# Patient Record
Sex: Male | Born: 1980
Health system: Southern US, Community
[De-identification: ages and names within clinical notes are randomized; demographics above are authoritative.]

## PROBLEM LIST (undated history)

## (undated) DIAGNOSIS — T7840XA Allergy, unspecified, initial encounter: Secondary | ICD-10-CM

## (undated) DIAGNOSIS — N200 Calculus of kidney: Secondary | ICD-10-CM

## (undated) DIAGNOSIS — I10 Essential (primary) hypertension: Secondary | ICD-10-CM

## (undated) DIAGNOSIS — F419 Anxiety disorder, unspecified: Secondary | ICD-10-CM

## (undated) DIAGNOSIS — Z87442 Personal history of urinary calculi: Secondary | ICD-10-CM

## (undated) DIAGNOSIS — K219 Gastro-esophageal reflux disease without esophagitis: Secondary | ICD-10-CM

## (undated) HISTORY — DX: Anxiety disorder, unspecified: F41.9

## (undated) HISTORY — DX: Essential (primary) hypertension: I10

## (undated) HISTORY — DX: Allergy, unspecified, initial encounter: T78.40XA

## (undated) HISTORY — DX: Gastro-esophageal reflux disease without esophagitis: K21.9

---

## 2006-03-20 ENCOUNTER — Emergency Department: Payer: Self-pay

## 2014-07-14 HISTORY — PX: ROOT CANAL: SHX2363

## 2014-11-24 DIAGNOSIS — F418 Other specified anxiety disorders: Secondary | ICD-10-CM

## 2014-11-24 DIAGNOSIS — L709 Acne, unspecified: Secondary | ICD-10-CM | POA: Insufficient documentation

## 2014-11-24 DIAGNOSIS — H7403 Tympanosclerosis, bilateral: Secondary | ICD-10-CM

## 2014-11-24 DIAGNOSIS — J309 Allergic rhinitis, unspecified: Secondary | ICD-10-CM | POA: Insufficient documentation

## 2014-11-24 DIAGNOSIS — K219 Gastro-esophageal reflux disease without esophagitis: Secondary | ICD-10-CM

## 2014-11-24 DIAGNOSIS — F17290 Nicotine dependence, other tobacco product, uncomplicated: Secondary | ICD-10-CM

## 2014-11-24 DIAGNOSIS — F419 Anxiety disorder, unspecified: Secondary | ICD-10-CM | POA: Insufficient documentation

## 2014-11-24 DIAGNOSIS — I159 Secondary hypertension, unspecified: Secondary | ICD-10-CM

## 2014-11-24 DIAGNOSIS — I10 Essential (primary) hypertension: Secondary | ICD-10-CM | POA: Insufficient documentation

## 2014-11-25 ENCOUNTER — Ambulatory Visit (INDEPENDENT_AMBULATORY_CARE_PROVIDER_SITE_OTHER): Payer: 59 | Admitting: Unknown Physician Specialty

## 2014-11-25 ENCOUNTER — Encounter: Payer: Self-pay | Admitting: Unknown Physician Specialty

## 2014-11-25 VITALS — BP 122/73 | HR 64 | Temp 97.6°F | Ht 65.1 in | Wt 142.2 lb

## 2014-11-25 DIAGNOSIS — K219 Gastro-esophageal reflux disease without esophagitis: Secondary | ICD-10-CM | POA: Diagnosis not present

## 2014-11-25 DIAGNOSIS — J309 Allergic rhinitis, unspecified: Secondary | ICD-10-CM | POA: Diagnosis not present

## 2014-11-25 MED ORDER — OMEPRAZOLE 20 MG PO CPDR: 20.0000 mg | DELAYED_RELEASE_CAPSULE | Freq: Every day | ORAL | Status: DC

## 2014-11-25 NOTE — Patient Instructions (Signed)
Food Choices for Gastroesophageal Reflux Disease When you have gastroesophageal reflux disease (GERD), the foods you eat and your eating habits are very important. Choosing the right foods can help ease the discomfort of GERD. WHAT GENERAL GUIDELINES DO I NEED TO FOLLOW?  Choose fruits, vegetables, whole grains, low-fat dairy products, and low-fat meat, fish, and poultry.  Limit fats such as oils, salad dressings, butter, nuts, and avocado.  Keep a food diary to identify foods that cause symptoms.  Avoid foods that cause reflux. These may be different for different people.  Eat frequent small meals instead of three large meals each day.  Eat your meals slowly, in a relaxed setting.  Limit fried foods.  Cook foods using methods other than frying.  Avoid drinking alcohol.  Avoid drinking large amounts of liquids with your meals.  Avoid bending over or lying down until 2-3 hours after eating. WHAT FOODS ARE NOT RECOMMENDED? The following are some foods and drinks that may worsen your symptoms: Vegetables Tomatoes. Tomato juice. Tomato and spaghetti sauce. Chili peppers. Onion and garlic. Horseradish. Fruits Oranges, grapefruit, and lemon (fruit and juice). Meats High-fat meats, fish, and poultry. This includes hot dogs, ribs, ham, sausage, salami, and bacon. Dairy Whole milk and chocolate milk. Sour cream. Cream. Butter. Ice cream. Cream cheese.  Beverages Coffee and tea, with or without caffeine. Carbonated beverages or energy drinks. Condiments Hot sauce. Barbecue sauce.  Sweets/Desserts Chocolate and cocoa. Donuts. Peppermint and spearmint. Fats and Oils High-fat foods, including French fries and potato chips. Other Vinegar. Strong spices, such as black pepper, white pepper, red pepper, cayenne, curry powder, cloves, ginger, and chili powder. The items listed above may not be a complete list of foods and beverages to avoid. Contact your dietitian for more  information. Document Released: 05/30/2005 Document Revised: 06/04/2013 Document Reviewed: 04/03/2013 ExitCare Patient Information 2015 [employer]. This information is not intended to replace advice given to you by your health care provider. Make sure you discuss any questions you have with your health care provider. Gastroesophageal Reflux Disease, Adult Gastroesophageal reflux disease (GERD) happens when acid from your stomach flows up into the esophagus. When acid comes in contact with the esophagus, the acid causes soreness (inflammation) in the esophagus. Over time, GERD may create small holes (ulcers) in the lining of the esophagus. CAUSES   Increased body weight. This puts pressure on the stomach, making acid rise from the stomach into the esophagus.  Smoking. This increases acid production in the stomach.  Drinking alcohol. This causes decreased pressure in the lower esophageal sphincter (valve or ring of muscle between the esophagus and stomach), allowing acid from the stomach into the esophagus.  Late evening meals and a full stomach. This increases pressure and acid production in the stomach.  A malformed lower esophageal sphincter. Sometimes, no cause is found. SYMPTOMS   Burning pain in the lower part of the mid-chest behind the breastbone and in the mid-stomach area. This may occur twice a week or more often.  Trouble swallowing.  Sore throat.  Dry cough.  Asthma-like symptoms including chest tightness, shortness of breath, or wheezing. DIAGNOSIS  Your caregiver may be able to diagnose GERD based on your symptoms. In some cases, X-rays and other tests may be done to check for complications or to check the condition of your stomach and esophagus. TREATMENT  Your caregiver may recommend over-the-counter or prescription medicines to help decrease acid production. Ask your caregiver before starting or adding any new medicines.  HOME CARE   INSTRUCTIONS   Change the  factors that you can control. Ask your caregiver for guidance concerning weight loss, quitting smoking, and alcohol consumption.  Avoid foods and drinks that make your symptoms worse, such as:  Caffeine or alcoholic drinks.  Chocolate.  Peppermint or mint flavorings.  Garlic and onions.  Spicy foods.  Citrus fruits, such as oranges, lemons, or limes.  Tomato-based foods such as sauce, chili, salsa, and pizza.  Fried and fatty foods.  Avoid lying down for the 3 hours prior to your bedtime or prior to taking a nap.  Eat small, frequent meals instead of large meals.  Wear loose-fitting clothing. Do not wear anything tight around your waist that causes pressure on your stomach.  Raise the head of your bed 6 to 8 inches with wood blocks to help you sleep. Extra pillows will not help.  Only take over-the-counter or prescription medicines for pain, discomfort, or fever as directed by your caregiver.  Do not take aspirin, ibuprofen, or other nonsteroidal anti-inflammatory drugs (NSAIDs). SEEK IMMEDIATE MEDICAL CARE IF:   You have pain in your arms, neck, jaw, teeth, or back.  Your pain increases or changes in intensity or duration.  You develop nausea, vomiting, or sweating (diaphoresis).  You develop shortness of breath, or you faint.  Your vomit is green, yellow, black, or looks like coffee grounds or blood.  Your stool is red, bloody, or black. These symptoms could be signs of other problems, such as heart disease, gastric bleeding, or esophageal bleeding. MAKE SURE YOU:   Understand these instructions.  Will watch your condition.  Will get help right away if you are not doing well or get worse. Document Released: 03/09/2005 Document Revised: 08/22/2011 Document Reviewed: 12/17/2010 ExitCare Patient Information 2015 [employer]. This information is not intended to replace advice given to you by your health care provider. Make sure you discuss any questions you have  with your health care provider.  

## 2014-11-25 NOTE — Assessment & Plan Note (Signed)
Well controlled without medication.  He will use OTC meds prn

## 2014-11-25 NOTE — Assessment & Plan Note (Addendum)
Rx for Omeprazole.  Pt ed on trigger avoidance.  Decrease from 40 mg to 20 mg

## 2014-11-25 NOTE — Progress Notes (Signed)
   BP 122/73 mmHg  Pulse 64  Temp(Src) 97.6 F (36.4 C)  Ht 5' 5.1" (1.654 m)  Wt 142 lb 3.2 oz (64.501 kg)  BMI 23.58 kg/m2  SpO2 99%   Subjective:    Patient ID: Rodney Myers, male    DOB: 03/14/1981, 34 y.o.   MRN: [id number]  HPI: Rodney Myers is a 34 y.o. male  Chief Complaint  Patient presents with  . Allergic Rhinitis   . Gastrophageal Reflux    pt states he needs a med refill for this   Gastrophageal Reflux He complains of abdominal pain, belching and heartburn. Really wants a refill of Omeprazole.  OTC Zantac not working. This is a chronic problem. The problem occurs constantly. The problem has been unchanged. The symptoms are aggravated by certain foods and smoking. Pertinent negatives include no anemia, fatigue, melena, muscle weakness, orthopnea or weight loss. The treatment provided significant relief.   Allergies:  Well controlled with taking local honey.  Barely uses Flonase or Allegra.    Relevant past medical, surgical, family and social history reviewed and updated as indicated. Interim medical history since our last visit reviewed. Allergies and medications reviewed and updated.  Review of Systems  Constitutional: Negative for weight loss and fatigue.  Gastrointestinal: Positive for heartburn and abdominal pain. Negative for melena.  Musculoskeletal: Negative for muscle weakness.    Per HPI unless specifically indicated above     Objective:    BP 122/73 mmHg  Pulse 64  Temp(Src) 97.6 F (36.4 C)  Ht 5' 5.1" (1.654 m)  Wt 142 lb 3.2 oz (64.501 kg)  BMI 23.58 kg/m2  SpO2 99%  Wt Readings from Last 3 Encounters:  11/25/14 142 lb 3.2 oz (64.501 kg)  02/18/14 135 lb (61.236 kg)    Physical Exam  Constitutional: He is oriented to person, place, and time. He appears well-developed and well-nourished. No distress.  HENT:  Head: Normocephalic and atraumatic.  Eyes: Conjunctivae and lids are normal. Right eye exhibits no discharge. Left eye  exhibits no discharge. No scleral icterus.  Cardiovascular: Normal rate and regular rhythm.   Pulmonary/Chest: Effort normal. No respiratory distress.  Abdominal: Normal appearance and bowel sounds are normal. He exhibits no distension. There is no splenomegaly or hepatomegaly. There is no tenderness.  Musculoskeletal: Normal range of motion.  Neurological: He is alert and oriented to person, place, and time.  Skin: Skin is intact. No rash noted. No pallor.  Psychiatric: He has a normal mood and affect. His behavior is normal. Judgment and thought content normal.    No results found for this or any previous visit.    Assessment & Plan:   Problem List Items Addressed This Visit      Respiratory   Allergic rhinitis - Primary    Well controlled without medication.  He will use OTC meds prn        Digestive   GERD (gastroesophageal reflux disease)    Rx for Omeprazole.  Pt ed on trigger avoidance.  Decrease from 40 mg to 20 mg      Relevant Medications   omeprazole (PRILOSEC) 20 MG capsule       Follow up plan: No Follow-up on file.

## 2015-03-06 ENCOUNTER — Encounter: Payer: Self-pay | Admitting: Unknown Physician Specialty

## 2015-03-06 ENCOUNTER — Ambulatory Visit (INDEPENDENT_AMBULATORY_CARE_PROVIDER_SITE_OTHER): Payer: 59 | Admitting: Unknown Physician Specialty

## 2015-03-06 VITALS — BP 125/91 | HR 85 | Temp 98.4°F | Ht 65.2 in | Wt 145.6 lb

## 2015-03-06 DIAGNOSIS — F17290 Nicotine dependence, other tobacco product, uncomplicated: Secondary | ICD-10-CM | POA: Diagnosis not present

## 2015-03-06 DIAGNOSIS — Z0000 Encounter for general adult medical examination without abnormal findings: Secondary | ICD-10-CM

## 2015-03-06 NOTE — Assessment & Plan Note (Signed)
Discussed smoking cessation.  Pt ed given.

## 2015-03-06 NOTE — Patient Instructions (Signed)
Smoking Cessation Quitting smoking is important to your health and has many advantages. However, it is not always easy to quit since nicotine is a very addictive drug. Oftentimes, people try 3 times or more before being able to quit. This document explains the best ways for you to prepare to quit smoking. Quitting takes hard work and a lot of effort, but you can do it. ADVANTAGES OF QUITTING SMOKING  You will live longer, feel better, and live better.  Your body will feel the impact of quitting smoking almost immediately.  Within 20 minutes, blood pressure decreases. Your pulse returns to its normal level.  After 8 hours, carbon monoxide levels in the blood return to normal. Your oxygen level increases.  After 24 hours, the chance of having a heart attack starts to decrease. Your breath, hair, and body stop smelling like smoke.  After 48 hours, damaged nerve endings begin to recover. Your sense of taste and smell improve.  After 72 hours, the body is virtually free of nicotine. Your bronchial tubes relax and breathing becomes easier.  After 2 to 12 weeks, lungs can hold more air. Exercise becomes easier and circulation improves.  The risk of having a heart attack, stroke, cancer, or lung disease is greatly reduced.  After 1 year, the risk of coronary heart disease is cut in half.  After 5 years, the risk of stroke falls to the same as a nonsmoker.  After 10 years, the risk of lung cancer is cut in half and the risk of other cancers decreases significantly.  After 15 years, the risk of coronary heart disease drops, usually to the level of a nonsmoker.  If you are pregnant, quitting smoking will improve your chances of having a healthy baby.  The people you live with, especially any children, will be healthier.  You will have extra money to spend on things other than cigarettes. QUESTIONS TO THINK ABOUT BEFORE ATTEMPTING TO QUIT You may want to talk about your answers with your  health care provider.  Why do you want to quit?  If you tried to quit in the past, what helped and what did not?  What will be the most difficult situations for you after you quit? How will you plan to handle them?  Who can help you through the tough times? Your family? Friends? A health care provider?  What pleasures do you get from smoking? What ways can you still get pleasure if you quit? Here are some questions to ask your health care provider:  How can you help me to be successful at quitting?  What medicine do you think would be best for me and how should I take it?  What should I do if I need more help?  What is smoking withdrawal like? How can I get information on withdrawal? GET READY  Set a quit date.  Change your environment by getting rid of all cigarettes, ashtrays, matches, and lighters in your home, car, or work. Do not let people smoke in your home.  Review your past attempts to quit. Think about what worked and what did not. GET SUPPORT AND ENCOURAGEMENT You have a better chance of being successful if you have help. You can get support in many ways.  Tell your family, friends, and coworkers that you are going to quit and need their support. Ask them not to smoke around you.  Get individual, group, or telephone counseling and support. Programs are available at local hospitals and health centers. Call   your local health department for information about programs in your area.  Spiritual beliefs and practices may help some smokers quit.  Download a "quit meter" on your computer to keep track of quit statistics, such as how long you have gone without smoking, cigarettes not smoked, and money saved.  Get a self-help book about quitting smoking and staying off tobacco. LEARN NEW SKILLS AND BEHAVIORS  Distract yourself from urges to smoke. Talk to someone, go for a walk, or occupy your time with a task.  Change your normal routine. Take a different route to work.  Drink tea instead of coffee. Eat breakfast in a different place.  Reduce your stress. Take a hot bath, exercise, or read a book.  Plan something enjoyable to do every day. Reward yourself for not smoking.  Explore interactive web-based programs that specialize in helping you quit. GET MEDICINE AND USE IT CORRECTLY Medicines can help you stop smoking and decrease the urge to smoke. Combining medicine with the above behavioral methods and support can greatly increase your chances of successfully quitting smoking.  Nicotine replacement therapy helps deliver nicotine to your body without the negative effects and risks of smoking. Nicotine replacement therapy includes nicotine gum, lozenges, inhalers, nasal sprays, and skin patches. Some may be available over-the-counter and others require a prescription.  Antidepressant medicine helps people abstain from smoking, but how this works is unknown. This medicine is available by prescription.  Nicotinic receptor partial agonist medicine simulates the effect of nicotine in your brain. This medicine is available by prescription. Ask your health care provider for advice about which medicines to use and how to use them based on your health history. Your health care provider will tell you what side effects to look out for if you choose to be on a medicine or therapy. Carefully read the information on the package. Do not use any other product containing nicotine while using a nicotine replacement product.  RELAPSE OR DIFFICULT SITUATIONS Most relapses occur within the first 3 months after quitting. Do not be discouraged if you start smoking again. Remember, most people try several times before finally quitting. You may have symptoms of withdrawal because your body is used to nicotine. You may crave cigarettes, be irritable, feel very hungry, cough often, get headaches, or have difficulty concentrating. The withdrawal symptoms are only temporary. They are strongest  when you first quit, but they will go away within 10-14 days. To reduce the chances of relapse, try to:  Avoid drinking alcohol. Drinking lowers your chances of successfully quitting.  Reduce the amount of caffeine you consume. Once you quit smoking, the amount of caffeine in your body increases and can give you symptoms, such as a rapid heartbeat, sweating, and anxiety.  Avoid smokers because they can make you want to smoke.  Do not let weight gain distract you. Many smokers will gain weight when they quit, usually less than 10 pounds. Eat a healthy diet and stay active. You can always lose the weight gained after you quit.  Find ways to improve your mood other than smoking. FOR MORE INFORMATION  www.smokefree.gov  Document Released: 05/24/2001 Document Revised: 10/14/2013 Document Reviewed: 09/08/2011 ExitCare Patient Information 2015 [employer]. This information is not intended to replace advice given to you by your health care provider. Make sure you discuss any questions you have with your health care provider.  

## 2015-03-06 NOTE — Progress Notes (Signed)
   BP 125/91 mmHg  Pulse 85  Temp(Src) 98.4 F (36.9 C)  Ht 5' 5.2" (1.656 m)  Wt 145 lb 9.6 oz (66.044 kg)  BMI 24.08 kg/m2  SpO2 99%   Subjective:    Patient ID: Rodney Myers, male    DOB: 06-05-1981, 34 y.o.   MRN: [id number]  HPI: Rodney Myers is a 34 y.o. male  Chief Complaint  Patient presents with  . Annual Exam    pt states he is getting his flu shot at [facility]    Relevant past medical, surgical, family and social history reviewed and updated as indicated. Interim medical history since our last visit reviewed. Allergies and medications reviewed and updated.  Smoking cessation: Has tried Chantix which mae him depressed.  He is trying to cut back.    Review of Systems  Constitutional: Negative.   HENT: Negative.   Eyes: Negative.   Respiratory: Negative.   Cardiovascular: Negative.   Gastrointestinal: Negative.   Endocrine: Negative.   Genitourinary: Negative.   Skin: Negative.   Allergic/Immunologic: Negative.   Neurological: Negative.   Hematological: Negative.   Psychiatric/Behavioral: Negative.     Per HPI unless specifically indicated above     Objective:    BP 125/91 mmHg  Pulse 85  Temp(Src) 98.4 F (36.9 C)  Ht 5' 5.2" (1.656 m)  Wt 145 lb 9.6 oz (66.044 kg)  BMI 24.08 kg/m2  SpO2 99%  Wt Readings from Last 3 Encounters:  03/06/15 145 lb 9.6 oz (66.044 kg)  11/25/14 142 lb 3.2 oz (64.501 kg)  02/18/14 135 lb (61.236 kg)    Physical Exam  Constitutional: He is oriented to person, place, and time. He appears well-developed and well-nourished.  HENT:  Head: Normocephalic.  Right Ear: External ear normal.  Left Ear: External ear normal.  Eyes: Pupils are equal, round, and reactive to light.  Cardiovascular: Normal rate, regular rhythm and normal heart sounds.   Pulmonary/Chest: Effort normal and breath sounds normal. No respiratory distress.  Abdominal: Soft. Bowel sounds are normal.  Musculoskeletal: Normal range of  motion.  Neurological: He is alert and oriented to person, place, and time. He has normal reflexes.  Skin: Skin is warm and dry.  Psychiatric: He has a normal mood and affect. His behavior is normal. Judgment and thought content normal.    No results found for this or any previous visit.    Assessment & Plan:   Problem List Items Addressed This Visit      Unprioritized   Other tobacco product nicotine dependence, uncomplicated    Discussed smoking cessation.  Pt ed given.         Other Visit Diagnoses    Annual physical exam    -  Primary    Relevant Orders    CBC with Differential/Platelet    Comprehensive metabolic panel    HIV antibody    Lipid Panel w/o Chol/HDL Ratio    TSH        Follow up plan: Return in about 1 year (around 03/05/2016), or if symptoms worsen or fail to improve.

## 2015-03-07 LAB — LIPID PANEL W/O CHOL/HDL RATIO
Cholesterol, Total: 151 mg/dL (ref 100–199)
HDL: 43 mg/dL (ref >39)
LDL Calculated: 99 mg/dL (ref 0–99)
Triglycerides: 47 mg/dL (ref 0–149)
VLDL Cholesterol Cal: 9 mg/dL (ref 5–40)

## 2015-03-07 LAB — CBC WITH DIFFERENTIAL/PLATELET
Basophils Absolute: 0.1 10*3/uL (ref 0.0–0.2)
Basos: 1 %
EOS (ABSOLUTE): 0.7 10*3/uL — ABNORMAL HIGH (ref 0.0–0.4)
Eos: 12 %
Hematocrit: 43.2 % (ref 37.5–51.0)
Hemoglobin: 14.7 g/dL (ref 12.6–17.7)
Immature Grans (Abs): 0 10*3/uL (ref 0.0–0.1)
Immature Granulocytes: 0 %
Lymphocytes Absolute: 1.9 10*3/uL (ref 0.7–3.1)
Lymphs: 32 %
MCH: 29 pg (ref 26.6–33.0)
MCHC: 34 g/dL (ref 31.5–35.7)
MCV: 85 fL (ref 79–97)
Monocytes Absolute: 0.4 10*3/uL (ref 0.1–0.9)
Monocytes: 7 %
Neutrophils Absolute: 2.8 10*3/uL (ref 1.4–7.0)
Neutrophils: 48 %
Platelets: 189 10*3/uL (ref 150–379)
RBC: 5.07 x10E6/uL (ref 4.14–5.80)
RDW: 13.2 % (ref 12.3–15.4)
WBC: 5.9 10*3/uL (ref 3.4–10.8)

## 2015-03-07 LAB — COMPREHENSIVE METABOLIC PANEL
ALT: 12 IU/L (ref 0–44)
AST: 15 IU/L (ref 0–40)
Albumin/Globulin Ratio: 2.1 (ref 1.1–2.5)
Albumin: 4.7 g/dL (ref 3.5–5.5)
Alkaline Phosphatase: 78 IU/L (ref 39–117)
BUN/Creatinine Ratio: 12 (ref 8–19)
BUN: 12 mg/dL (ref 6–20)
Bilirubin Total: 0.8 mg/dL (ref 0.0–1.2)
CO2: 23 mmol/L (ref 18–29)
Calcium: 9.5 mg/dL (ref 8.7–10.2)
Chloride: 102 mmol/L (ref 97–108)
Creatinine, Ser: 0.99 mg/dL (ref 0.76–1.27)
GFR calc Af Amer: 115 mL/min/{1.73_m2} (ref >59)
GFR calc non Af Amer: 100 mL/min/{1.73_m2} (ref >59)
Globulin, Total: 2.2 g/dL (ref 1.5–4.5)
Glucose: 97 mg/dL (ref 65–99)
Potassium: 4 mmol/L (ref 3.5–5.2)
Sodium: 143 mmol/L (ref 134–144)
Total Protein: 6.9 g/dL (ref 6.0–8.5)

## 2015-03-07 LAB — HIV ANTIBODY (ROUTINE TESTING W REFLEX): HIV Screen 4th Generation wRfx: NONREACTIVE

## 2015-03-07 LAB — TSH: TSH: 1.9 u[IU]/mL (ref 0.450–4.500)

## 2015-03-09 ENCOUNTER — Encounter: Payer: Self-pay | Admitting: Unknown Physician Specialty

## 2015-11-11 DIAGNOSIS — H5201 Hypermetropia, right eye: Secondary | ICD-10-CM | POA: Diagnosis not present

## 2016-03-17 DIAGNOSIS — Z23 Encounter for immunization: Secondary | ICD-10-CM | POA: Diagnosis not present

## 2016-08-02 ENCOUNTER — Encounter: Payer: Self-pay | Admitting: Family Medicine

## 2016-08-02 ENCOUNTER — Encounter: Payer: Self-pay | Admitting: Unknown Physician Specialty

## 2016-08-02 ENCOUNTER — Ambulatory Visit (INDEPENDENT_AMBULATORY_CARE_PROVIDER_SITE_OTHER): Payer: BLUE CROSS/BLUE SHIELD | Admitting: Family Medicine

## 2016-08-02 VITALS — BP 122/81 | HR 77 | Temp 98.4°F | Wt 129.0 lb

## 2016-08-02 DIAGNOSIS — J029 Acute pharyngitis, unspecified: Secondary | ICD-10-CM | POA: Diagnosis not present

## 2016-08-02 DIAGNOSIS — J101 Influenza due to other identified influenza virus with other respiratory manifestations: Secondary | ICD-10-CM | POA: Diagnosis not present

## 2016-08-02 DIAGNOSIS — R6889 Other general symptoms and signs: Secondary | ICD-10-CM | POA: Diagnosis not present

## 2016-08-02 LAB — VERITOR FLU A/B WAIVED
Influenza A: POSITIVE — AB
Influenza B: NEGATIVE

## 2016-08-02 MED ORDER — METRONIDAZOLE 500 MG PO TABS: 500.0000 mg | ORAL_TABLET | Freq: Two times a day (BID) | ORAL | 0 refills | Status: DC

## 2016-08-02 MED ORDER — FLUCONAZOLE 150 MG PO TABS: 150.0000 mg | ORAL_TABLET | Freq: Once | ORAL | 0 refills | Status: DC

## 2016-08-02 MED ORDER — OSELTAMIVIR PHOSPHATE 75 MG PO CAPS: 75.0000 mg | ORAL_CAPSULE | Freq: Two times a day (BID) | ORAL | 0 refills | Status: DC

## 2016-08-02 NOTE — Progress Notes (Signed)
BP 122/81   Pulse 77   Temp 98.4 F (36.9 C)   Wt 129 lb (58.5 kg)   SpO2 99%   BMI 21.34 kg/m    Subjective:    Patient ID: Rodney Myers, male    DOB: 20-Nov-1980, 36 y.o.   MRN: [id number]  HPI: Rodney Myers is a 36 y.o. male  Chief Complaint  Patient presents with  . Sore Throat    x2 days, sore throat, slight cough, fatigue,some soreness,mild head congestion, slight h/a   Patient with 2 days of sore throat, cough, fatigue, chills, aches, congestion, and HA. Denies known fever, ear pain, N/V/D, CP, SOB. Taking tylenol and nyquil with some relief. Many sick contacts recently.   Past Medical History:  Diagnosis Date  . Allergy   . Anxiety   . GERD (gastroesophageal reflux disease)   . Hypertension    Social History   Social History  . Marital status: Single    Spouse name: N/A  . Number of children: N/A  . Years of education: N/A   Occupational History  . Not on file.   Social History Main Topics  . Smoking status: Current Every Day Smoker    Packs/day: 1.00    Types: Cigarettes  . Smokeless tobacco: Former Neurosurgeon  . Alcohol use 0.0 oz/week     Comment: pt states very rare  . Drug use: No  . Sexual activity: No   Other Topics Concern  . Not on file   Social History Narrative  . No narrative on file    Relevant past medical, surgical, family and social history reviewed and updated as indicated. Interim medical history since our last visit reviewed. Allergies and medications reviewed and updated.  Review of Systems  Constitutional: Positive for chills and fatigue.  HENT: Positive for congestion and sore throat.   Eyes: Negative.   Respiratory: Positive for cough.   Cardiovascular: Negative.   Gastrointestinal: Negative.   Genitourinary: Negative.   Musculoskeletal: Positive for myalgias.  Neurological: Positive for headaches.  Psychiatric/Behavioral: Negative.     Per HPI unless specifically indicated above     Objective:    BP  122/81   Pulse 77   Temp 98.4 F (36.9 C)   Wt 129 lb (58.5 kg)   SpO2 99%   BMI 21.34 kg/m   Wt Readings from Last 3 Encounters:  08/02/16 129 lb (58.5 kg)  03/06/15 145 lb 9.6 oz (66 kg)  11/25/14 142 lb 3.2 oz (64.5 kg)    Physical Exam  Constitutional: He is oriented to person, place, and time. He appears well-developed and well-nourished. No distress.  HENT:  Head: Atraumatic.  Right Ear: External ear normal.  Left Ear: External ear normal.  Nose: Nose normal.  Mouth/Throat: No oropharyngeal exudate.  Oropharynx erythematous   Eyes: Conjunctivae are normal. Pupils are equal, round, and reactive to light. No scleral icterus.  Neck: Normal range of motion. Neck supple.  Cardiovascular: Normal rate and normal heart sounds.   Pulmonary/Chest: Effort normal and breath sounds normal. No respiratory distress.  Musculoskeletal: Normal range of motion.  Lymphadenopathy:    He has no cervical adenopathy.  Neurological: He is alert and oriented to person, place, and time.  Skin: Skin is warm and dry.  Psychiatric: He has a normal mood and affect. His behavior is normal.  Nursing note and vitals reviewed.   Results for orders placed or performed in visit on 08/02/16  Rapid strep screen (not at [facility])  Result Value Ref Range   Strep Gp A Ag, IA W/Reflex Negative Negative  Influenza A & B (STAT)  Result Value Ref Range   Influenza A Positive (A) Negative   Influenza B Negative Negative  Culture, Group A Strep  Result Value Ref Range   Strep A Culture WILL FOLLOW       Assessment & Plan:   Problem List Items Addressed This Visit    None    Visit Diagnoses    Influenza A    -  Primary   Tamiflu sent, discussed supportive care and continuing tylenol, ibuprofen, nyquil prn. Follow up if worsening or no improvement   Relevant Medications   oseltamivir (TAMIFLU) 75 MG capsule   Other Relevant Orders   Influenza A & B (STAT) (Completed)   Sore throat       Rapid strep  negative, await cx. Lozenges, throat sprays, ibuprofen for sympomatic relief.    Relevant Orders   [facility]apid [facility] screen [facility]not at [facility]) (Completed)       Follow up plan: Return if symptoms worsen or fail to improve.

## 2016-08-02 NOTE — Patient Instructions (Addendum)
Follow up as needed

## 2016-08-05 LAB — CULTURE, GROUP A STREP: Strep A Culture: NEGATIVE

## 2016-08-05 LAB — RAPID STREP SCREEN (MED CTR MEBANE ONLY): Strep Gp A Ag, IA W/Reflex: NEGATIVE

## 2016-12-27 ENCOUNTER — Ambulatory Visit (INDEPENDENT_AMBULATORY_CARE_PROVIDER_SITE_OTHER): Payer: BLUE CROSS/BLUE SHIELD | Admitting: Unknown Physician Specialty

## 2016-12-27 ENCOUNTER — Encounter: Payer: Self-pay | Admitting: Unknown Physician Specialty

## 2016-12-27 VITALS — BP 120/80 | HR 93 | Wt 139.0 lb

## 2016-12-27 DIAGNOSIS — R21 Rash and other nonspecific skin eruption: Secondary | ICD-10-CM | POA: Diagnosis not present

## 2016-12-27 DIAGNOSIS — B880 Other acariasis: Secondary | ICD-10-CM

## 2016-12-27 MED ORDER — PREDNISONE 10 MG PO TABS: ORAL_TABLET | ORAL | 0 refills | Status: DC

## 2016-12-27 MED ORDER — BETAMETHASONE DIPROPIONATE AUG 0.05 % EX CREA: TOPICAL_CREAM | Freq: Two times a day (BID) | CUTANEOUS | 0 refills | Status: DC

## 2016-12-27 MED ORDER — DOXYCYCLINE HYCLATE 100 MG PO TABS: 100.0000 mg | ORAL_TABLET | Freq: Two times a day (BID) | ORAL | 0 refills | Status: DC

## 2016-12-27 NOTE — Progress Notes (Addendum)
   BP 120/80 (BP Location: Left Arm, Patient Position: Sitting, Cuff Size: Normal)   Pulse 93   Wt 139 lb (63 kg)   SpO2 98%   BMI 22.99 kg/m    Subjective:    Patient ID: Rodney Myers, male    DOB: 1981/02/23, 36 y.o.   MRN: [id number]  HPI: Rodney Massedicholas J Ayotte is a 36 y.o. male  Chief Complaint  Patient presents with  . Other    bites, foot is covered, blisters popped.    Pt with rash of bilateral legs.  He developed blisters worse on right foot then left.  He then popped the blisters with a sterile knife and wrapped with gauze.  States it itches but not as painful as it was.  States it's a little better than yesterday but not spreading.    Relevant past medical, surgical, family and social history reviewed and updated as indicated. Interim medical history since our last visit reviewed. Allergies and medications reviewed and updated.  Review of Systems  Per HPI unless specifically indicated above     Objective:    BP 120/80 (BP Location: Left Arm, Patient Position: Sitting, Cuff Size: Normal)   Pulse 93   Wt 139 lb (63 kg)   SpO2 98%   BMI 22.99 kg/m   Wt Readings from Last 3 Encounters:  12/27/16 139 lb (63 kg)  08/02/16 129 lb (58.5 kg)  03/06/15 145 lb 9.6 oz (66 kg)    Physical Exam  Constitutional: He is oriented to person, place, and time. He appears well-developed and well-nourished. No distress.  HENT:  Head: Normocephalic and atraumatic.  Eyes: Conjunctivae and lids are normal. Right eye exhibits no discharge. Left eye exhibits no discharge. No scleral icterus.  Cardiovascular: Normal rate.   Pulmonary/Chest: Effort normal.  Abdominal: Normal appearance. There is no splenomegaly or hepatomegaly.  Musculoskeletal: Normal range of motion.  Neurological: He is alert and oriented to person, place, and time.  Skin: Skin is intact. Rash noted. No pallor.  Bilateral legs some blistering but mostly macular rash.    Psychiatric: He has a normal mood and affect.  His behavior is normal. Judgment and thought content normal.    Results for orders placed or performed in visit on 08/02/16  Rapid strep screen (not at [facility])  Result Value Ref Range   Strep Gp A Ag, IA W/Reflex Negative Negative  Influenza A & B (STAT)  Result Value Ref Range   Influenza A Positive (A) Negative   Influenza B Negative Negative  Culture, Group A Strep  Result Value Ref Range   Strep A Culture Negative       Assessment & Plan:   Problem List Items Addressed This Visit    None    Visit Diagnoses    Rash and nonspecific skin eruption    -  Primary   Chigger bites       Treat secondary infection with Doxycycline.  Steroid cream.  Rx with prednisone dose pack       Follow up plan: Return if symptoms worsen or fail to improve.

## 2017-03-22 DIAGNOSIS — Z23 Encounter for immunization: Secondary | ICD-10-CM | POA: Diagnosis not present

## 2017-04-16 ENCOUNTER — Emergency Department: Payer: BLUE CROSS/BLUE SHIELD

## 2017-04-16 ENCOUNTER — Emergency Department
Admission: EM | Admit: 2017-04-16 | Discharge: 2017-04-16 | Disposition: A | Discharge status: Home / Self Care | Payer: BLUE CROSS/BLUE SHIELD | Attending: Student in an Organized Health Care Education/Training Program | Admitting: Student in an Organized Health Care Education/Training Program

## 2017-04-16 ENCOUNTER — Other Ambulatory Visit: Payer: Self-pay

## 2017-04-16 DIAGNOSIS — I10 Essential (primary) hypertension: Secondary | ICD-10-CM | POA: Diagnosis not present

## 2017-04-16 DIAGNOSIS — R109 Unspecified abdominal pain: Secondary | ICD-10-CM | POA: Insufficient documentation

## 2017-04-16 DIAGNOSIS — F17210 Nicotine dependence, cigarettes, uncomplicated: Secondary | ICD-10-CM | POA: Insufficient documentation

## 2017-04-16 DIAGNOSIS — R110 Nausea: Secondary | ICD-10-CM | POA: Insufficient documentation

## 2017-04-16 DIAGNOSIS — R319 Hematuria, unspecified: Secondary | ICD-10-CM | POA: Diagnosis not present

## 2017-04-16 LAB — URINALYSIS, COMPLETE (UACMP) WITH MICROSCOPIC
Bacteria, UA: NONE SEEN
Bilirubin Urine: NEGATIVE
Glucose, UA: NEGATIVE mg/dL
Ketones, ur: NEGATIVE mg/dL
Leukocytes, UA: NEGATIVE
Nitrite: NEGATIVE
Protein, ur: 30 mg/dL — AB
Specific Gravity, Urine: 1.02 (ref 1.005–1.030)
Squamous Epithelial / LPF: NONE SEEN
WBC, UA: NONE SEEN WBC/hpf (ref 0–5)
pH: 7 (ref 5.0–8.0)

## 2017-04-16 LAB — CBC
HCT: 45.8 % (ref 40.0–52.0)
Hemoglobin: 15.6 g/dL (ref 13.0–18.0)
MCH: 29.6 pg (ref 26.0–34.0)
MCHC: 34 g/dL (ref 32.0–36.0)
MCV: 87 fL (ref 80.0–100.0)
Platelets: 233 10*3/uL (ref 150–440)
RBC: 5.27 MIL/uL (ref 4.40–5.90)
RDW: 13 % (ref 11.5–14.5)
WBC: 7.4 10*3/uL (ref 3.8–10.6)

## 2017-04-16 LAB — BASIC METABOLIC PANEL
Anion gap: 7 (ref 5–15)
BUN: 19 mg/dL (ref 6–20)
CO2: 26 mmol/L (ref 22–32)
Calcium: 9.3 mg/dL (ref 8.9–10.3)
Chloride: 106 mmol/L (ref 101–111)
Creatinine, Ser: 1.08 mg/dL (ref 0.61–1.24)
GFR calc Af Amer: >60 mL/min (ref >60)
GFR calc non Af Amer: >60 mL/min (ref >60)
Glucose, Bld: 130 mg/dL — ABNORMAL HIGH (ref 65–99)
Potassium: 3.9 mmol/L (ref 3.5–5.1)
Sodium: 139 mmol/L (ref 135–145)

## 2017-04-16 MED ORDER — MORPHINE SULFATE (PF) 4 MG/ML IV SOLN
4.0000 mg | Freq: Once | INTRAVENOUS | Status: AC
  Administered 2017-04-16: 4 mg via INTRAVENOUS
  Filled 2017-04-16: qty 1

## 2017-04-16 MED ORDER — ONDANSETRON HCL 4 MG PO TABS: 4.0000 mg | ORAL_TABLET | Freq: Every day | ORAL | 0 refills | Status: AC | PRN

## 2017-04-16 MED ORDER — PROMETHAZINE HCL 25 MG/ML IJ SOLN
12.5000 mg | Freq: Once | INTRAMUSCULAR | Status: AC
  Administered 2017-04-16: 12.5 mg via INTRAVENOUS
  Filled 2017-04-16: qty 1

## 2017-04-16 MED ORDER — KETOROLAC TROMETHAMINE 30 MG/ML IJ SOLN
15.0000 mg | Freq: Once | INTRAMUSCULAR | Status: AC
  Administered 2017-04-16: 15 mg via INTRAVENOUS
  Filled 2017-04-16: qty 1

## 2017-04-16 MED ORDER — HYDROCODONE-ACETAMINOPHEN 5-325 MG PO TABS: 1.0000 | ORAL_TABLET | Freq: Once | ORAL | Status: DC

## 2017-04-16 MED ORDER — SODIUM CHLORIDE 0.9 % IV BOLUS (SEPSIS): 1000.0000 mL | Freq: Once | INTRAVENOUS | Status: DC

## 2017-04-16 MED ORDER — HYDROCODONE-ACETAMINOPHEN 5-325 MG PO TABS: 1.0000 | ORAL_TABLET | ORAL | 0 refills | Status: DC | PRN

## 2017-04-16 MED ORDER — FENTANYL CITRATE (PF) 100 MCG/2ML IJ SOLN
50.0000 ug | INTRAMUSCULAR | Status: DC | PRN
  Administered 2017-04-16: 50 ug via NASAL
  Filled 2017-04-16: qty 2

## 2017-04-16 NOTE — ED Notes (Signed)
Pt c/o low left flank pain; unable to stand up straight; history of kidney stones; declined offer of wheelchair, says it will hurt too bad to sit

## 2017-04-16 NOTE — Discharge Instructions (Signed)

## 2017-04-16 NOTE — ED Notes (Signed)
Patient transported to Ultrasound 

## 2017-04-16 NOTE — ED Notes (Signed)
Remains pain free. NAD. No needs.

## 2017-04-16 NOTE — ED Triage Notes (Signed)
Patient reports left flank pain since approximately 4 am.  Reports history of kidney stones.

## 2017-04-16 NOTE — ED Provider Notes (Signed)
[facility] Emergency Department Provider Note    First MD Initiated Contact with Patient 04/16/17 [phone]     (approximate)  I have reviewed the triage vital signs and the nursing notes.   HISTORY  Chief Complaint Flank Pain    HPI Rodney MANNINEN is a 36 y.o. male presents from home with chief complaint of acute onset left flank pain.  Patient with a history of kidney stones.  Denies any fevers.  Does endorse nausea.  States the pain is moderate to severe.  States that he had a brief episode last week.  Denies any rashes.  Denies any pain in his testicles.  No constipation or diarrhea.   Past Medical History:  Diagnosis Date  . Allergy   . Anxiety   . GERD (gastroesophageal reflux disease)   . Hypertension    Family History  Problem Relation Age of Onset  . Cancer Maternal Grandfather        nasal  . Hypertension Mother   . Diabetes Maternal Grandmother   . Heart disease Paternal Grandfather    Past Surgical History:  Procedure Laterality Date  . ROOT CANAL  07/2014   Patient Active Problem List   Diagnosis Date Noted  . Tympanosclerosis of both ears 11/24/2014  . Situational anxiety 11/24/2014  . GERD (gastroesophageal reflux disease) 11/24/2014  . Other tobacco product nicotine dependence, uncomplicated 11/24/2014  . Allergic rhinitis 11/24/2014      Prior to Admission medications   Medication Sig Start Date End Date Taking? Authorizing Provider  augmented betamethasone dipropionate (DIPROLENE AF) 0.05 % cream Apply topically 2 (two) times daily. 12/27/16   Gabriel Cirri, NP  doxycycline (VIBRA-TABS) 100 MG tablet Take 1 tablet (100 mg total) by mouth 2 (two) times daily. 12/27/16   Gabriel Cirri, NP  HYDROcodone-acetaminophen (NORCO) 5-325 MG tablet Take 1 tablet every 4 (four) hours as needed by mouth for moderate pain. 04/16/17   Willy Eddy, MD  ondansetron (ZOFRAN) 4 MG tablet Take 1 tablet (4 mg total) daily as needed by  mouth for nausea or vomiting. 04/16/17 04/16/18  Willy Eddy, MD  predniSONE (DELTASONE) 10 MG tablet 6-5-4-3-2-1-1/2-1/2  Take with food 12/27/16   Gabriel Cirri, NP    Allergies Patient has no known allergies.    Social History Social History   Tobacco Use  . Smoking status: Current Every Day Smoker    Packs/day: 1.00    Types: Cigarettes  . Smokeless tobacco: Former Engineer, water Use Topics  . Alcohol use: Yes    Alcohol/week: 0.0 oz    Comment: pt states very rare  . Drug use: No    Review of Systems Patient denies headaches, rhinorrhea, blurry vision, numbness, shortness of breath, chest pain, edema, cough, abdominal pain, nausea, vomiting, diarrhea, dysuria, fevers, rashes or hallucinations unless otherwise stated above in HPI. ____________________________________________   PHYSICAL EXAM:  VITAL SIGNS: Vitals:   04/16/17 0646 04/16/17 0905  BP: 123/77 (!) 97/59  Pulse: 61 (!) 48  Resp: (!) 22 (!) 24  Temp: (!) 97.5 F (36.4 C)   SpO2: 100% 100%    Constitutional: Alert and oriented. Well appearing and in no acute distress. Eyes: Conjunctivae are normal.  Head: Atraumatic. Nose: No congestion/rhinnorhea. Mouth/Throat: Mucous membranes are moist.   Neck: No stridor. Painless ROM.  Cardiovascular: Normal rate, regular rhythm. Grossly normal heart sounds.  Good peripheral circulation. Respiratory: Normal respiratory effort.  No retractions. Lungs CTAB. Gastrointestinal: Soft and nontender. No distention. No abdominal  bruits. + left CVA tenderness. Genitourinary:  Musculoskeletal: No lower extremity tenderness nor edema.  No joint effusions. Neurologic:  Normal speech and language. No gross focal neurologic deficits are appreciated. No facial droop Skin:  Skin is warm, dry and intact. No rash noted. Psychiatric: Mood and affect are normal. Speech and behavior are normal.  ____________________________________________   LABS (all labs ordered are  listed, but only abnormal results are displayed)  Results for orders placed or performed during the hospital encounter of 04/16/17 (from the past 24 hour(s))  Urinalysis, Complete w Microscopic     Status: Abnormal   Collection Time: 04/16/17  6:49 AM  Result Value Ref Range   Color, Urine YELLOW (A) YELLOW   APPearance TURBID (A) CLEAR   Specific Gravity, Urine 1.020 1.005 - 1.030   pH 7.0 5.0 - 8.0   Glucose, UA NEGATIVE NEGATIVE mg/dL   Hgb urine dipstick MODERATE (A) NEGATIVE   Bilirubin Urine NEGATIVE NEGATIVE   Ketones, ur NEGATIVE NEGATIVE mg/dL   Protein, ur 30 (A) NEGATIVE mg/dL   Nitrite NEGATIVE NEGATIVE   Leukocytes, UA NEGATIVE NEGATIVE   RBC / HPF TOO NUMEROUS TO COUNT 0 - 5 RBC/hpf   WBC, UA NONE SEEN 0 - 5 WBC/hpf   Bacteria, UA NONE SEEN NONE SEEN   Squamous Epithelial / LPF NONE SEEN NONE SEEN   Amorphous Crystal PRESENT    Crystals PRESENT (A) NEGATIVE  Basic metabolic panel     Status: Abnormal   Collection Time: 04/16/17  6:49 AM  Result Value Ref Range   Sodium 139 135 - 145 mmol/L   Potassium 3.9 3.5 - 5.1 mmol/L   Chloride 106 101 - 111 mmol/L   CO2 26 22 - 32 mmol/L   Glucose, Bld 130 (H) 65 - 99 mg/dL   BUN 19 6 - 20 mg/dL   Creatinine, Ser 4.09 0.61 - 1.24 mg/dL   Calcium 9.3 8.9 - 81.1 mg/dL   GFR calc non Af Amer >60 >60 mL/min   GFR calc Af Amer >60 >60 mL/min   Anion gap 7 5 - 15  CBC     Status: None   Collection Time: 04/16/17  6:49 AM  Result Value Ref Range   WBC 7.4 3.8 - 10.6 K/uL   RBC 5.27 4.40 - 5.90 MIL/uL   Hemoglobin 15.6 13.0 - 18.0 g/dL   HCT 91.4 78.2 - 95.6 %   MCV 87.0 80.0 - 100.0 fL   MCH 29.6 26.0 - 34.0 pg   MCHC 34.0 32.0 - 36.0 g/dL   RDW 21.3 08.6 - 57.8 %   Platelets 233 150 - 440 K/uL   ____________________________________________ ____________________________________________  RADIOLOGY  I personally reviewed all radiographic images ordered to evaluate for the above acute complaints and reviewed radiology  reports and findings.  These findings were personally discussed with the patient.  Please see medical record for radiology report.  ____________________________________________   PROCEDURES  Procedure(s) performed:  Procedures    Critical Care performed: no ____________________________________________   INITIAL IMPRESSION / ASSESSMENT AND PLAN / ED COURSE  Pertinent labs & imaging results that were available during my care of the patient were reviewed by me and considered in my medical decision making (see chart for details).  DDX: stone, pyelo, msk strain, shingles, colitis, uti  Rodney Myers is a 36 y.o. who presents to the ED with  with Hx of stones p/w acute left flank pain. No fevers, no systemic symptoms. - urinary symptoms. Denies  trauma or injury. Afebrile in ED. Exam as above. Flank TTP, otherwise abdominal exam is benign. No peritoneal signs. Possible kidney stone, cystitis, or pyelonephritis.  UA with hematuria, no bacturia..  US with no significant hydro.  Clinical picture is not consistent with appendicitis, diverticulitis, pancreatitis, cholecystitis, bowel perforation, aortic dissection, splenic injury or acute abdominal process at this time.  Pain improved, tolerating PO. Repeat ABD exam benign, will plan supportive treatment and early follow up for recheck.       ____________________________________________   FINAL CLINICAL IMPRESSION(S) / ED DIAGNOSES  Final diagnoses:  Flank pain, acute  Hematuria, unspecified type      NEW MEDICATIONS STARTED DURING THIS VISIT:  This SmartLink is deprecated. Use AVSMEDLIST instead to display the medication list for a patient.   Note:  This document was prepared using Dragon voice recognition software and may include unintentional dictation errors.    Willy Eddyobinson, Reni Hausner, MD 04/16/17 [phone]

## 2017-04-19 DIAGNOSIS — N23 Unspecified renal colic: Secondary | ICD-10-CM | POA: Diagnosis not present

## 2017-04-19 DIAGNOSIS — N201 Calculus of ureter: Secondary | ICD-10-CM | POA: Diagnosis not present

## 2017-04-24 ENCOUNTER — Ambulatory Visit: Payer: BLUE CROSS/BLUE SHIELD | Admitting: Urology

## 2017-04-26 ENCOUNTER — Encounter: Payer: Self-pay | Admitting: Unknown Physician Specialty

## 2017-04-26 ENCOUNTER — Ambulatory Visit (INDEPENDENT_AMBULATORY_CARE_PROVIDER_SITE_OTHER): Payer: BLUE CROSS/BLUE SHIELD | Admitting: Unknown Physician Specialty

## 2017-04-26 VITALS — BP 139/92 | HR 94 | Temp 98.1°F | Wt 155.0 lb

## 2017-04-26 DIAGNOSIS — J029 Acute pharyngitis, unspecified: Secondary | ICD-10-CM

## 2017-04-26 DIAGNOSIS — J069 Acute upper respiratory infection, unspecified: Secondary | ICD-10-CM

## 2017-04-26 NOTE — Progress Notes (Signed)
BP (!) 139/92   Pulse 94   Temp 98.1 F (36.7 C) (Oral)   Wt 155 lb (70.3 kg)   SpO2 99%   BMI 25.79 kg/m    Subjective:    Patient ID: Rodney Myers, male    DOB: [birth year], 36 y.o.   MRN: [id number]  HPI: Rodney Myers is a 36 y.o. male  Chief Complaint  Patient presents with  . Sore Throat    pt states he has had a sore throat and fatigue since yesterday   Sore Throat   This is a new problem. The current episode started yesterday. The problem has been gradually worsening. Neither side of throat is experiencing more pain than the other. There has been no fever. Associated symptoms include coughing and a hoarse voice. Pertinent negatives include no abdominal pain, congestion, ear pain, shortness of breath, swollen glands, trouble swallowing or vomiting. He has had no exposure to strep or mono.     Relevant past medical, surgical, family and social history reviewed and updated as indicated. Interim medical history since our last visit reviewed. Allergies and medications reviewed and updated.  Review of Systems  HENT: Positive for hoarse voice. Negative for congestion, ear pain and trouble swallowing.   Respiratory: Positive for cough. Negative for shortness of breath.   Gastrointestinal: Negative for abdominal pain and vomiting.    Per HPI unless specifically indicated above     Objective:    BP (!) 139/92   Pulse 94   Temp 98.1 F (36.7 C) (Oral)   Wt 155 lb (70.3 kg)   SpO2 99%   BMI 25.79 kg/m   Wt Readings from Last 3 Encounters:  04/26/17 155 lb (70.3 kg)  04/16/17 140 lb (63.5 kg)  12/27/16 139 lb (63 kg)    Physical Exam  Results for orders placed or performed during the hospital encounter of 04/16/17  Urinalysis, Complete w Microscopic  Result Value Ref Range   Color, Urine YELLOW (A) YELLOW   APPearance TURBID (A) CLEAR   Specific Gravity, Urine 1.020 1.005 - 1.030   pH 7.0 5.0 - 8.0   Glucose, UA NEGATIVE NEGATIVE mg/dL   Hgb urine  dipstick MODERATE (A) NEGATIVE   Bilirubin Urine NEGATIVE NEGATIVE   Ketones, ur NEGATIVE NEGATIVE mg/dL   Protein, ur 30 (A) NEGATIVE mg/dL   Nitrite NEGATIVE NEGATIVE   Leukocytes, UA NEGATIVE NEGATIVE   RBC / HPF TOO NUMEROUS TO COUNT 0 - 5 RBC/hpf   WBC, UA NONE SEEN 0 - 5 WBC/hpf   Bacteria, UA NONE SEEN NONE SEEN   Squamous Epithelial / LPF NONE SEEN NONE SEEN   Amorphous Crystal PRESENT    Crystals PRESENT (A) NEGATIVE  Basic metabolic panel  Result Value Ref Range   Sodium 139 135 - 145 mmol/L   Potassium 3.9 3.5 - 5.1 mmol/L   Chloride 106 101 - 111 mmol/L   CO2 26 22 - 32 mmol/L   Glucose, Bld 130 (H) 65 - 99 mg/dL   BUN 19 6 - 20 mg/dL   Creatinine, Ser 4.091.08 0.61 - 1.24 mg/dL   Calcium 9.3 8.9 - 81.110.3 mg/dL   GFR calc non Af Amer >60 >60 mL/min   GFR calc Af Amer >60 >60 mL/min   Anion gap 7 5 - 15  CBC  Result Value Ref Range   WBC 7.4 3.8 - 10.6 K/uL   RBC 5.27 4.40 - 5.90 MIL/uL   Hemoglobin 15.6 13.0 - 18.0 g/dL  HCT 45.8 40.0 - 52.0 %   MCV 87.0 80.0 - 100.0 fL   MCH 29.6 26.0 - 34.0 pg   MCHC 34.0 32.0 - 36.0 g/dL   RDW 65.713.0 84.611.5 - 96.214.5 %   Platelets 233 150 - 440 K/uL      Assessment & Plan:   Problem List Items Addressed This Visit    None    Visit Diagnoses    Sore throat    -  Primary   Relevant Orders   Rapid strep screen (not at [facility])   Viral upper respiratory tract infection       Rest, fluids, saline nasal rinses.        Strep is negative  Follow up plan: Return if symptoms worsen or fail to improve.

## 2017-04-28 LAB — RAPID STREP SCREEN (MED CTR MEBANE ONLY): Strep Gp A Ag, IA W/Reflex: NEGATIVE

## 2017-04-28 LAB — CULTURE, GROUP A STREP: Strep A Culture: NEGATIVE

## 2017-09-25 ENCOUNTER — Encounter: Payer: Self-pay | Admitting: *Deleted

## 2017-09-25 ENCOUNTER — Ambulatory Visit
Admission: EM | Admit: 2017-09-25 | Discharge: 2017-09-25 | Disposition: A | Discharge status: Home / Self Care | Payer: BLUE CROSS/BLUE SHIELD | Attending: Family Medicine | Admitting: Family Medicine

## 2017-09-25 DIAGNOSIS — R109 Unspecified abdominal pain: Secondary | ICD-10-CM

## 2017-09-25 DIAGNOSIS — R110 Nausea: Secondary | ICD-10-CM | POA: Diagnosis not present

## 2017-09-25 DIAGNOSIS — M5489 Other dorsalgia: Secondary | ICD-10-CM

## 2017-09-25 HISTORY — DX: Calculus of kidney: N20.0

## 2017-09-25 LAB — URINALYSIS, COMPLETE (UACMP) WITH MICROSCOPIC
Bacteria, UA: NONE SEEN
Glucose, UA: NEGATIVE mg/dL
Ketones, ur: 40 mg/dL — AB
Leukocytes, UA: NEGATIVE
Nitrite: NEGATIVE
Protein, ur: 30 mg/dL — AB
Specific Gravity, Urine: >1.03 — ABNORMAL HIGH (ref 1.005–1.030)
Squamous Epithelial / LPF: NONE SEEN
pH: 5.5 (ref 5.0–8.0)

## 2017-09-25 MED ORDER — HYDROCODONE-ACETAMINOPHEN 5-325 MG PO TABS: 1.0000 | ORAL_TABLET | Freq: Four times a day (QID) | ORAL | 0 refills | Status: DC | PRN

## 2017-09-25 MED ORDER — TAMSULOSIN HCL 0.4 MG PO CAPS: 0.4000 mg | ORAL_CAPSULE | Freq: Every morning | ORAL | 0 refills | Status: DC

## 2017-09-25 MED ORDER — KETOROLAC TROMETHAMINE 60 MG/2ML IM SOLN
60.0000 mg | Freq: Once | INTRAMUSCULAR | Status: AC
  Administered 2017-09-25: 60 mg via INTRAMUSCULAR

## 2017-09-25 NOTE — Discharge Instructions (Signed)
Drink plenty of fluids.  Screen all your urine to capture stones.  If symptoms worsen go to the emergency room. This Includes any fever chills nausea or vomiting.

## 2017-09-25 NOTE — ED Triage Notes (Signed)
Left flank and back pain since last night. Hx of kidney stones.

## 2017-09-25 NOTE — ED Provider Notes (Signed)
[facility]    CSN: [id number] Arrival date & time: 09/25/17  4098     History   Chief Complaint Chief Complaint  Patient presents with  . Back Pain  . Flank Pain    HPI Rodney Myers is a 37 y.o. male.   HPI     37 year old male presents with left flank and back pain that started suddenly at 8:00 last night.  Had  nausea and one episode of vomiting and one loose stool.  History of kidney stones.  His pain is in the paraspinous muscles and slightly lateral approximately the L3-4 level.  He does have complaints of radiation into his flank.  Pain level today is 6-7.  Been under the care of Dr. Sheppard Penton for his kidney stones in the past.  That until now all of his stones have been small.          Past Medical History:  Diagnosis Date  . Allergy   . Anxiety   . GERD (gastroesophageal reflux disease)   . Hypertension   . Kidney calculi     Patient Active Problem List   Diagnosis Date Noted  . Tympanosclerosis of both ears 11/24/2014  . Situational anxiety 11/24/2014  . GERD (gastroesophageal reflux disease) 11/24/2014  . Other tobacco product nicotine dependence, uncomplicated 11/24/2014  . Allergic rhinitis 11/24/2014    Past Surgical History:  Procedure Laterality Date  . ROOT CANAL  07/2014       Home Medications    Prior to Admission medications   Medication Sig Start Date End Date Taking? Authorizing Provider  ondansetron (ZOFRAN) 4 MG tablet Take 1 tablet (4 mg total) daily as needed by mouth for nausea or vomiting. 04/16/17 04/16/18 Yes Willy Eddy, MD  HYDROcodone-acetaminophen (NORCO/VICODIN) 5-325 MG tablet Take 1-2 tablets by mouth every 6 (six) hours as needed for severe pain. 09/25/17   Lutricia Feil, PA-C  tamsulosin (FLOMAX) 0.4 MG CAPS capsule Take 1 capsule (0.4 mg total) by mouth every morning. 09/25/17   Lutricia Feil, PA-C    Family History Family History  Problem Relation Age of Onset  . Cancer Maternal  Grandfather        nasal  . Hypertension Mother   . Other Father   . Diabetes Maternal Grandmother   . Heart disease Paternal Grandfather     Social History Social History   Tobacco Use  . Smoking status: Current Every Day Smoker    Packs/day: 1.00    Types: Cigarettes  . Smokeless tobacco: Former Engineer, water Use Topics  . Alcohol use: Yes    Alcohol/week: 0.0 oz    Comment: pt states very rare  . Drug use: No     Allergies   Patient has no known allergies.   Review of Systems Review of Systems  Constitutional: Positive for activity change. Negative for chills, fatigue and fever.  Genitourinary: Positive for flank pain.  All other systems reviewed and are negative.    Physical Exam Triage Vital Signs ED Triage Vitals  Enc Vitals Group     BP 09/25/17 0836 (!) 122/94     Pulse Rate 09/25/17 0836 (!) 50     Resp 09/25/17 0836 16     Temp 09/25/17 0836 98.2 F (36.8 C)     Temp Source 09/25/17 0836 Oral     SpO2 09/25/17 0836 100 %     Weight 09/25/17 0839 135 lb (61.2 kg)     Height 09/25/17 0839  5\' 5"  (1.651 m)     Head Circumference --      Peak Flow --      Pain Score 09/25/17 0838 7     Pain Loc --      Pain Edu? --      Excl. in GC? --    No data found.  Updated Vital Signs BP (!) 122/94 (BP Location: Left Arm)   Pulse (!) 50   Temp 98.2 F (36.8 C) (Oral)   Resp 16   Ht 5\' 5"  (1.651 m)   Wt 135 lb (61.2 kg)   SpO2 100%   BMI 22.47 kg/m   Visual Acuity Right Eye Distance:   Left Eye Distance:   Bilateral Distance:    Right Eye Near:   Left Eye Near:    Bilateral Near:     Physical Exam  Constitutional: He is oriented to person, place, and time. He appears well-developed and well-nourished. No distress.  HENT:  Head: Normocephalic.  Eyes: Right eye exhibits no discharge. Left eye exhibits no discharge. No scleral icterus.  Neck: Normal range of motion.  Pulmonary/Chest: Effort normal and breath sounds normal.  Abdominal:  Soft. Bowel sounds are normal. He exhibits no distension and no mass. There is no tenderness. There is no rebound and no guarding.  Musculoskeletal: Normal range of motion. He exhibits tenderness.  Neurological: He is alert and oriented to person, place, and time.  Skin: Skin is warm and dry. He is not diaphoretic.  Psychiatric: He has a normal mood and affect. His behavior is normal. Judgment and thought content normal.  Nursing note and vitals reviewed.    UC Treatments / Results  Labs (all labs ordered are listed, but only abnormal results are displayed) Labs Reviewed  URINALYSIS, COMPLETE (UACMP) WITH MICROSCOPIC - Abnormal; Notable for the following components:      Result Value   Specific Gravity, Urine >1.030 (*)    Hgb urine dipstick MODERATE (*)    Bilirubin Urine SMALL (*)    Ketones, ur 40 (*)    Protein, ur 30 (*)    All other components within normal limits    EKG None Radiology No results found.  Procedures Procedures (including critical care time)  Medications Ordered in UC Medications  ketorolac (TORADOL) injection 60 mg (60 mg Intramuscular Given 09/25/17 0908)  His pain level dropped from a 6-7/10 to a 3 after the injection   Initial Impression / Assessment and Plan / UC Course  I have reviewed the triage vital signs and the nursing notes.  Pertinent labs & imaging results that were available during my care of the patient were reviewed by me and considered in my medical decision making (see chart for details).     Plan: 1. Test/x-ray results and diagnosis reviewed with patient 2. rx as per orders; risks, benefits, potential side effects reviewed with patient 3. Recommend supportive treatment with fluids and rest.  Recommend screening all urine for possible stones.  I have offered the patient CT scan but he would rather wait and see if he can pass the stone on his own.  I told him if he develops any fever chills nausea or vomiting that is worsening or  pain that is unremitting he should go immediately to the emergency room.  He is agreeable to this.  I will start him on Flomax. 4. F/u prn if symptoms worsen or don't improve    Final Clinical Impressions(s) / UC Diagnoses   Final diagnoses:  Flank pain    ED Discharge Orders        Ordered    tamsulosin (FLOMAX) 0.4 MG CAPS capsule   Every morning - 10a     09/25/17 0938    HYDROcodone-acetaminophen (NORCO/VICODIN) 5-325 MG tablet  Every 6 hours PRN     09/25/17 0938       Controlled Substance Prescriptions [place] Controlled Substance Registry consulted? Review opioid [place] substance abuse registry showed the patient had only one prescription for hydrocodone No. 6 on 04/16/2017 none since.   Lutricia FeilRoemer, Loann Chahal P, PA-C 09/25/17 [phone]

## 2018-02-21 DIAGNOSIS — L309 Dermatitis, unspecified: Secondary | ICD-10-CM | POA: Diagnosis not present

## 2018-03-20 DIAGNOSIS — Z23 Encounter for immunization: Secondary | ICD-10-CM | POA: Diagnosis not present

## 2018-04-27 ENCOUNTER — Ambulatory Visit (INDEPENDENT_AMBULATORY_CARE_PROVIDER_SITE_OTHER): Payer: BLUE CROSS/BLUE SHIELD | Admitting: Family Medicine

## 2018-04-27 ENCOUNTER — Encounter: Payer: Self-pay | Admitting: Family Medicine

## 2018-04-27 VITALS — BP 126/83 | HR 96 | Temp 98.1°F | Wt 140.8 lb

## 2018-04-27 DIAGNOSIS — J069 Acute upper respiratory infection, unspecified: Secondary | ICD-10-CM | POA: Diagnosis not present

## 2018-04-27 DIAGNOSIS — B9789 Other viral agents as the cause of diseases classified elsewhere: Secondary | ICD-10-CM

## 2018-04-27 NOTE — Patient Instructions (Signed)
Follow up as needed

## 2018-04-27 NOTE — Progress Notes (Signed)
BP 126/83 (BP Location: Left Arm, Cuff Size: Normal)   Pulse 96   Temp 98.1 F (36.7 C) (Oral)   Wt 140 lb 12.8 oz (63.9 kg)   SpO2 98%   BMI 23.43 kg/m    Subjective:    Patient ID: Rodney Myers, male    DOB:Rodney Myers [birth year], 37 y.o.   MRN: [id number]  HPI: Rodney Massedicholas J Myers is a 37 y.o. male  Chief Complaint  Patient presents with  . URI    pt states he has had a sore throat, congestion, sinus pressure and a mild cough since last Wednesday   Sore throat, congestion, headache, facial pain and pressure, cough x 1.5 weeks. Woke up this morning all of a sudden feeling much better with no lingering sxs. Has been using nyquil nightly with good relief. Denies fevers, chills, aches, Cp, SOB. Several sick contacts recently. NO hx of pulmonary dz, hx of allergic rhinitis on no chronic allergy medications.   Relevant past medical, surgical, family and social history reviewed and updated as indicated. Interim medical history since our last visit reviewed. Allergies and medications reviewed and updated.  Review of Systems  Per HPI unless specifically indicated above     Objective:    BP 126/83 (BP Location: Left Arm, Cuff Size: Normal)   Pulse 96   Temp 98.1 F (36.7 C) (Oral)   Wt 140 lb 12.8 oz (63.9 kg)   SpO2 98%   BMI 23.43 kg/m   Wt Readings from Last 3 Encounters:  04/27/18 140 lb 12.8 oz (63.9 kg)  09/25/17 135 lb (61.2 kg)  04/26/17 155 lb (70.3 kg)    Physical Exam  Constitutional: He is oriented to person, place, and time. He appears well-developed and well-nourished. No distress.  HENT:  Head: Atraumatic.  Right Ear: External ear normal.  Left Ear: External ear normal.  Nasal mucosa and oropharynx erythematous and mildly edematous No thick drainage present  Eyes: Conjunctivae and EOM are normal.  Neck: Normal range of motion. Neck supple.  Cardiovascular: Normal rate and regular rhythm.  Pulmonary/Chest: Effort normal and breath sounds normal. No respiratory  distress. He has no wheezes.  Musculoskeletal: Normal range of motion.  Neurological: He is alert and oriented to person, place, and time.  Skin: Skin is warm and dry.  Psychiatric: He has a normal mood and affect. His behavior is normal.  Nursing note and vitals reviewed.   Results for orders placed or performed during the hospital encounter of 09/25/17  Urinalysis, Complete w Microscopic  Result Value Ref Range   Color, Urine YELLOW YELLOW   APPearance CLEAR CLEAR   Specific Gravity, Urine >1.030 (H) 1.005 - 1.030   pH 5.5 5.0 - 8.0   Glucose, UA NEGATIVE NEGATIVE mg/dL   Hgb urine dipstick MODERATE (A) NEGATIVE   Bilirubin Urine SMALL (A) NEGATIVE   Ketones, ur 40 (A) NEGATIVE mg/dL   Protein, ur 30 (A) NEGATIVE mg/dL   Nitrite NEGATIVE NEGATIVE   Leukocytes, UA NEGATIVE NEGATIVE   Squamous Epithelial / LPF NONE SEEN NONE SEEN   WBC, UA 6-30 0 - 5 WBC/hpf   RBC / HPF TOO NUMEROUS TO COUNT 0 - 5 RBC/hpf   Bacteria, UA NONE SEEN NONE SEEN   Mucus PRESENT       Assessment & Plan:   Problem List Items Addressed This Visit    None    Visit Diagnoses    Viral URI with cough    -  Primary  Resolved as of this morning, flonase BID to help with remaining nasal inflammation. Call if worsening again       Follow up plan: Return if symptoms worsen or fail to improve.

## 2019-03-11 DIAGNOSIS — Z23 Encounter for immunization: Secondary | ICD-10-CM | POA: Diagnosis not present

## 2019-03-30 ENCOUNTER — Emergency Department: Payer: BC Managed Care – PPO

## 2019-03-30 ENCOUNTER — Other Ambulatory Visit: Payer: Self-pay

## 2019-03-30 ENCOUNTER — Encounter: Payer: Self-pay | Admitting: Emergency Medicine

## 2019-03-30 ENCOUNTER — Emergency Department
Admission: EM | Admit: 2019-03-30 | Discharge: 2019-03-30 | Disposition: A | Discharge status: Home / Self Care | Payer: BC Managed Care – PPO | Attending: Student in an Organized Health Care Education/Training Program | Admitting: Student in an Organized Health Care Education/Training Program

## 2019-03-30 DIAGNOSIS — R109 Unspecified abdominal pain: Secondary | ICD-10-CM | POA: Diagnosis not present

## 2019-03-30 DIAGNOSIS — I10 Essential (primary) hypertension: Secondary | ICD-10-CM | POA: Insufficient documentation

## 2019-03-30 DIAGNOSIS — N201 Calculus of ureter: Secondary | ICD-10-CM | POA: Insufficient documentation

## 2019-03-30 DIAGNOSIS — F17210 Nicotine dependence, cigarettes, uncomplicated: Secondary | ICD-10-CM | POA: Insufficient documentation

## 2019-03-30 DIAGNOSIS — N132 Hydronephrosis with renal and ureteral calculous obstruction: Secondary | ICD-10-CM | POA: Diagnosis not present

## 2019-03-30 LAB — BASIC METABOLIC PANEL
Anion gap: 10 (ref 5–15)
BUN: 14 mg/dL (ref 6–20)
CO2: 23 mmol/L (ref 22–32)
Calcium: 9.3 mg/dL (ref 8.9–10.3)
Chloride: 105 mmol/L (ref 98–111)
Creatinine, Ser: 0.96 mg/dL (ref 0.61–1.24)
GFR calc Af Amer: >60 mL/min (ref >60)
GFR calc non Af Amer: >60 mL/min (ref >60)
Glucose, Bld: 156 mg/dL — ABNORMAL HIGH (ref 70–99)
Potassium: 3.7 mmol/L (ref 3.5–5.1)
Sodium: 138 mmol/L (ref 135–145)

## 2019-03-30 LAB — CBC
HCT: 43.9 % (ref 39.0–52.0)
Hemoglobin: 15.3 g/dL (ref 13.0–17.0)
MCH: 29.7 pg (ref 26.0–34.0)
MCHC: 34.9 g/dL (ref 30.0–36.0)
MCV: 85.2 fL (ref 80.0–100.0)
Platelets: 274 10*3/uL (ref 150–400)
RBC: 5.15 MIL/uL (ref 4.22–5.81)
RDW: 12.5 % (ref 11.5–15.5)
WBC: 10.8 10*3/uL — ABNORMAL HIGH (ref 4.0–10.5)
nRBC: 0 % (ref 0.0–0.2)

## 2019-03-30 LAB — URINALYSIS, COMPLETE (UACMP) WITH MICROSCOPIC
Bilirubin Urine: NEGATIVE
Glucose, UA: NEGATIVE mg/dL
Ketones, ur: 5 mg/dL — AB
Leukocytes,Ua: NEGATIVE
Nitrite: NEGATIVE
Protein, ur: 30 mg/dL — AB
RBC / HPF: >50 RBC/hpf — ABNORMAL HIGH (ref 0–5)
Specific Gravity, Urine: 1.021 (ref 1.005–1.030)
pH: 5 (ref 5.0–8.0)

## 2019-03-30 MED ORDER — TAMSULOSIN HCL 0.4 MG PO CAPS
0.4000 mg | ORAL_CAPSULE | Freq: Every day | ORAL | Status: DC
  Administered 2019-03-30: 0.4 mg via ORAL
  Filled 2019-03-30: qty 1

## 2019-03-30 MED ORDER — KETOROLAC TROMETHAMINE 30 MG/ML IJ SOLN
15.0000 mg | Freq: Once | INTRAMUSCULAR | Status: AC
  Administered 2019-03-30: 15 mg via INTRAVENOUS
  Filled 2019-03-30: qty 1

## 2019-03-30 MED ORDER — ONDANSETRON HCL 4 MG/2ML IJ SOLN
4.0000 mg | Freq: Once | INTRAMUSCULAR | Status: AC
  Administered 2019-03-30: 4 mg via INTRAVENOUS
  Filled 2019-03-30: qty 2

## 2019-03-30 MED ORDER — HYDROCODONE-ACETAMINOPHEN 5-325 MG PO TABS
1.0000 | ORAL_TABLET | Freq: Once | ORAL | Status: AC
  Administered 2019-03-30: 1 via ORAL
  Filled 2019-03-30: qty 1

## 2019-03-30 MED ORDER — ONDANSETRON HCL 4 MG PO TABS: 4.0000 mg | ORAL_TABLET | Freq: Every day | ORAL | 0 refills | Status: DC | PRN

## 2019-03-30 MED ORDER — HYDROCODONE-ACETAMINOPHEN 5-325 MG PO TABS: 1.0000 | ORAL_TABLET | ORAL | 0 refills | Status: DC | PRN

## 2019-03-30 MED ORDER — MORPHINE SULFATE (PF) 4 MG/ML IV SOLN: 4.0000 mg | INTRAVENOUS | Status: DC | PRN

## 2019-03-30 MED ORDER — TAMSULOSIN HCL 0.4 MG PO CAPS: 0.4000 mg | ORAL_CAPSULE | Freq: Every day | ORAL | 0 refills | Status: DC

## 2019-03-30 NOTE — ED Provider Notes (Signed)
[facility] Emergency Department Provider Note    First MD Initiated Contact with Patient 03/30/19 1808     (approximate)  I have reviewed the triage vital signs and the nursing notes.   HISTORY  Chief Complaint Flank Pain    HPI Rodney Myers is a 38 y.o. male history of kidney stones presents to the ER for sudden onset   right flank pain radiating to his right groin.  No associated fevers.  Did have nausea associated with this pain.  States it feels similar to previous kidney stones.  Not had any dysuria or fever or chills.  Currently rates the pain as 8 out of 10 in severity.   Past Medical History:  Diagnosis Date  . Allergy   . Anxiety   . GERD (gastroesophageal reflux disease)   . Hypertension   . Kidney calculi    Family History  Problem Relation Age of Onset  . Cancer Maternal Grandfather        nasal  . Hypertension Mother   . Other Father   . Diabetes Maternal Grandmother   . Heart disease Paternal Grandfather    Past Surgical History:  Procedure Laterality Date  . ROOT CANAL  07/2014   Patient Active Problem List   Diagnosis Date Noted  . Tympanosclerosis of both ears 11/24/2014  . Situational anxiety 11/24/2014  . GERD (gastroesophageal reflux disease) 11/24/2014  . Other tobacco product nicotine dependence, uncomplicated 11/24/2014  . Allergic rhinitis 11/24/2014      Prior to Admission medications   Not on File    Allergies Patient has no known allergies.    Social History Social History   Tobacco Use  . Smoking status: Current Every Day Smoker    Packs/day: 0.50    Types: Cigarettes  . Smokeless tobacco: Former Engineer, water Use Topics  . Alcohol use: Yes    Alcohol/week: 0.0 standard drinks    Comment: pt states very rare  . Drug use: No    Review of Systems Patient denies headaches, rhinorrhea, blurry vision, numbness, shortness of breath, chest pain, edema, cough, abdominal pain, nausea,  vomiting, diarrhea, dysuria, fevers, rashes or hallucinations unless otherwise stated above in HPI. ____________________________________________   PHYSICAL EXAM:  VITAL SIGNS: Vitals:   03/30/19 1651  BP: 118/88  Pulse: (!) 56  Resp: 18  Temp: 97.8 F (36.6 C)  SpO2: 98%    Constitutional: Alert and oriented.  Eyes: Conjunctivae are normal.  Head: Atraumatic. Nose: No congestion/rhinnorhea. Mouth/Throat: Mucous membranes are moist.   Neck: No stridor. Painless ROM.  Cardiovascular: Normal rate, regular rhythm. Grossly normal heart sounds.  Good peripheral circulation. Respiratory: Normal respiratory effort.  No retractions. Lungs CTAB. Gastrointestinal: Soft and nontender. No distention. No abdominal bruits. + rt CVA tenderness. Genitourinary:  Musculoskeletal: No lower extremity tenderness nor edema.  No joint effusions. Neurologic:  Normal speech and language. No gross focal neurologic deficits are appreciated. No facial droop Skin:  Skin is warm, dry and intact. No rash noted. Psychiatric: Mood and affect are normal. Speech and behavior are normal.  ____________________________________________   LABS (all labs ordered are listed, but only abnormal results are displayed)  Results for orders placed or performed during the hospital encounter of 03/30/19 (from the past 24 hour(s))  Basic metabolic panel     Status: Abnormal   Collection Time: 03/30/19  5:10 PM  Result Value Ref Range   Sodium 138 135 - 145 mmol/L   Potassium 3.7 3.5 -  5.1 mmol/L   Chloride 105 98 - 111 mmol/L   CO2 23 22 - 32 mmol/L   Glucose, Bld 156 (H) 70 - 99 mg/dL   BUN 14 6 - 20 mg/dL   Creatinine, Ser 0.96 0.61 - 1.24 mg/dL   Calcium 9.3 8.9 - 10.3 mg/dL   GFR calc non Af Amer >60 >60 mL/min   GFR calc Af Amer >60 >60 mL/min   Anion gap 10 5 - 15  CBC     Status: Abnormal   Collection Time: 03/30/19  5:10 PM  Result Value Ref Range   WBC 10.8 (H) 4.0 - 10.5 K/uL   RBC 5.15 4.22 - 5.81  MIL/uL   Hemoglobin 15.3 13.0 - 17.0 g/dL   HCT 43.9 39.0 - 52.0 %   MCV 85.2 80.0 - 100.0 fL   MCH 29.7 26.0 - 34.0 pg   MCHC 34.9 30.0 - 36.0 g/dL   RDW 12.5 11.5 - 15.5 %   Platelets 274 150 - 400 K/uL   nRBC 0.0 0.0 - 0.2 %   ____________________________________________ _______________________________  RADIOLOGY  I personally reviewed all radiographic images ordered to evaluate for the above acute complaints and reviewed radiology reports and findings.  These findings were personally discussed with the patient.  Please see medical record for radiology report.  ____________________________________________   PROCEDURES  Procedure(s) performed:  Procedures    Critical Care performed: no ____________________________________________   INITIAL IMPRESSION / ASSESSMENT AND PLAN / ED COURSE  Pertinent labs & imaging results that were available during my care of the patient were reviewed by me and considered in my medical decision making (see chart for details).   DDX: stone, pyelo, ureteral colic, appy  Rodney Myers is a 38 y.o. who presents to the ED with right flank pain as described above.  Work-up consistent with right 3 mm UV J stone with right-sided hydro-.  Patient pain significantly improved after IV medication.  Is tolerating oral hydration.  No evidence of infection.  Discussed supportive care and follow-up with urology.     The patient was evaluated in Emergency Department today for the symptoms described in the history of present illness. He/she was evaluated in the context of the global COVID-19 pandemic, which necessitated consideration that the patient might be at risk for infection with the SARS-CoV-2 virus that causes COVID-19. Institutional protocols and algorithms that pertain to the evaluation of patients at risk for COVID-19 are in a state of rapid change based on information released by regulatory bodies including the CDC and federal and state  organizations. These policies and algorithms were followed during the patient's care in the ED.  As part of my medical decision making, I reviewed the following data within the [facility] notes reviewed and incorporated, Labs reviewed, notes from prior ED visits and [place] Controlled Substance Database   ____________________________________________   FINAL CLINICAL IMPRESSION(S) / ED DIAGNOSES  Final diagnoses:  Right flank pain  Ureterolithiasis      NEW MEDICATIONS STARTED DURING THIS VISIT:  New Prescriptions   No medications on file     Note:  This document was prepared using Dragon voice recognition software and may include unintentional dictation errors.    Merlyn Lot, MD 03/30/19 1850

## 2019-03-30 NOTE — ED Notes (Signed)
Pt attempting to give urine sample at this time.

## 2019-03-30 NOTE — ED Triage Notes (Signed)
R flank pain began 3 hours ago. History of kidney stones.

## 2019-03-30 NOTE — ED Notes (Signed)
Patient transported to CT 

## 2020-01-23 DIAGNOSIS — Z720 Tobacco use: Secondary | ICD-10-CM | POA: Diagnosis not present

## 2020-02-11 ENCOUNTER — Encounter: Payer: Self-pay | Admitting: Emergency Medicine

## 2020-02-11 ENCOUNTER — Other Ambulatory Visit: Payer: Self-pay

## 2020-02-11 ENCOUNTER — Ambulatory Visit
Admission: EM | Admit: 2020-02-11 | Discharge: 2020-02-11 | Disposition: A | Discharge status: Home / Self Care | Payer: BC Managed Care – PPO | Attending: Emergency Medicine | Admitting: Emergency Medicine

## 2020-02-11 DIAGNOSIS — J029 Acute pharyngitis, unspecified: Secondary | ICD-10-CM | POA: Diagnosis not present

## 2020-02-11 DIAGNOSIS — Z20822 Contact with and (suspected) exposure to covid-19: Secondary | ICD-10-CM | POA: Diagnosis not present

## 2020-02-11 DIAGNOSIS — F17210 Nicotine dependence, cigarettes, uncomplicated: Secondary | ICD-10-CM | POA: Diagnosis not present

## 2020-02-11 DIAGNOSIS — J069 Acute upper respiratory infection, unspecified: Secondary | ICD-10-CM | POA: Insufficient documentation

## 2020-02-11 DIAGNOSIS — I10 Essential (primary) hypertension: Secondary | ICD-10-CM | POA: Insufficient documentation

## 2020-02-11 DIAGNOSIS — R05 Cough: Secondary | ICD-10-CM | POA: Insufficient documentation

## 2020-02-11 LAB — SARS CORONAVIRUS 2 (TAT 6-24 HRS): SARS Coronavirus 2: NEGATIVE

## 2020-02-11 NOTE — Discharge Instructions (Addendum)
Recommend continue OTC Nyquil/Dayquil as directed- may also try OTC Sudafed decongestant to help with sinus congestion. Continue to push fluids to help loosen up mucus. Follow-up pending COVID 19 test results.

## 2020-02-11 NOTE — ED Triage Notes (Signed)
Patient in today c/o sinus problem, with runny nose, head congestion and cough x 3 days. Patient has taken OTC Nyquil/Dayquil without relief. Patient has completed the covid vaccines in 10/2019. Patient had a negative covid test on 02/06/20 and it was negative.

## 2020-02-11 NOTE — ED Provider Notes (Signed)
[facility]    CSN: [id number] Arrival date & time: 02/11/20  3235      History   Chief Complaint Chief Complaint  Patient presents with  . Sinus Problem  . Cough    HPI Rodney Myers is a 39 y.o. male.   39 year old male presents with nasal congestion, cough, runny nose and fatigue for the past 3 days. Started with cough and sore throat which is improving. Denies any fever or GI symptoms. Has taken Dayquil and Nyquil with minimal relief. Does smoke tobacco daily. Was tested for COVID 19 5 days ago due to family member ill. Was negative (along with family member as well) but requests retesting again today, mostly for work. Is fully vaccinated. Does feel better. No other chronic health issues. Takes no daily medication.   The history is provided by the patient.    Past Medical History:  Diagnosis Date  . Allergy   . Anxiety   . GERD (gastroesophageal reflux disease)   . Hypertension   . Kidney calculi     Patient Active Problem List   Diagnosis Date Noted  . Tympanosclerosis of both ears 11/24/2014  . Situational anxiety 11/24/2014  . GERD (gastroesophageal reflux disease) 11/24/2014  . Other tobacco product nicotine dependence, uncomplicated 11/24/2014  . Allergic rhinitis 11/24/2014    Past Surgical History:  Procedure Laterality Date  . ROOT CANAL  07/2014       Home Medications    Prior to Admission medications   Not on File    Family History Family History  Problem Relation Age of Onset  . Cancer Maternal Grandfather        nasal  . Hypertension Mother   . Other Father   . Diabetes Maternal Grandmother   . Heart disease Paternal Grandfather     Social History Social History   Tobacco Use  . Smoking status: Current Every Day Smoker    Packs/day: 0.75    Types: Cigarettes  . Smokeless tobacco: Never Used  Vaping Use  . Vaping Use: Former  Substance Use Topics  . Alcohol use: Yes    Alcohol/week: 0.0 standard drinks     Comment: pt states very rare  . Drug use: Yes    Frequency: 12.0 times per week    Types: Marijuana    Comment: 1-2 times per day     Allergies   Patient has no known allergies.   Review of Systems Review of Systems  Constitutional: Positive for fatigue. Negative for activity change, appetite change, chills and fever.  HENT: Positive for congestion, postnasal drip, rhinorrhea, sinus pressure, sinus pain and sore throat (resolved now). Negative for ear discharge, ear pain, facial swelling, mouth sores, nosebleeds and trouble swallowing.   Respiratory: Positive for cough. Negative for chest tightness, shortness of breath and wheezing.   Gastrointestinal: Negative for diarrhea, nausea and vomiting.  Musculoskeletal: Negative for arthralgias, myalgias, neck pain and neck stiffness.  Skin: Negative for color change, rash and wound.  Allergic/Immunologic: Negative for food allergies and immunocompromised state.  Neurological: Negative for dizziness, seizures, syncope, weakness, light-headedness and numbness.  Hematological: Negative for adenopathy. Does not bruise/bleed easily.     Physical Exam Triage Vital Signs ED Triage Vitals [02/11/20 1047]  Enc Vitals Group     BP (!) 137/94     Pulse Rate 90     Resp 18     Temp 98.2 F (36.8 C)     Temp Source Oral  SpO2 100 %     Weight 120 lb (54.4 kg)     Height 5\' 5"  (1.651 m)     Head Circumference      Peak Flow      Pain Score 0     Pain Loc      Pain Edu?      Excl. in GC?    No data found.  Updated Vital Signs BP (!) 137/94 (BP Location: Left Arm)   Pulse 90   Temp 98.2 F (36.8 C) (Oral)   Resp 18   Ht 5\' 5"  (1.651 m)   Wt 120 lb (54.4 kg)   SpO2 100%   BMI 19.97 kg/m   Visual Acuity Right Eye Distance:   Left Eye Distance:   Bilateral Distance:    Right Eye Near:   Left Eye Near:    Bilateral Near:     Physical Exam Vitals and nursing note reviewed.  Constitutional:      General: He is awake. He  is not in acute distress.    Appearance: He is well-developed and well-groomed. He is not ill-appearing.     Comments: He is sitting comfortably on the exam table in no acute distress.   HENT:     Head: Normocephalic and atraumatic.     Right Ear: Hearing, tympanic membrane, ear canal and external ear normal.     Left Ear: Hearing, tympanic membrane, ear canal and external ear normal.     Nose: Congestion present.     Right Sinus: No maxillary sinus tenderness or frontal sinus tenderness.     Left Sinus: No maxillary sinus tenderness or frontal sinus tenderness.     Mouth/Throat:     Lips: Pink.     Mouth: Mucous membranes are moist.     Pharynx: Oropharynx is clear. Uvula midline. Posterior oropharyngeal erythema present. No pharyngeal swelling, oropharyngeal exudate or uvula swelling.  Eyes:     Extraocular Movements: Extraocular movements intact.     Conjunctiva/sclera: Conjunctivae normal.  Cardiovascular:     Rate and Rhythm: Normal rate and regular rhythm.     Heart sounds: Normal heart sounds. No murmur heard.   Pulmonary:     Effort: Pulmonary effort is normal. No respiratory distress.     Breath sounds: Normal breath sounds and air entry. No decreased air movement. No decreased breath sounds, wheezing, rhonchi or rales.  Musculoskeletal:        General: Normal range of motion.     Cervical back: Normal range of motion and neck supple. No rigidity.  Lymphadenopathy:     Cervical: No cervical adenopathy.  Skin:    General: Skin is warm and dry.     Capillary Refill: Capillary refill takes less than 2 seconds.     Findings: No rash.  Neurological:     General: No focal deficit present.     Mental Status: He is alert and oriented to person, place, and time.  Psychiatric:        Attention and Perception: Attention normal.        Mood and Affect: Mood is anxious.        Behavior: Behavior normal. Behavior is cooperative.        Thought Content: Thought content normal.         Cognition and Memory: Cognition normal.        Judgment: Judgment normal.      UC Treatments / Results  Labs (all labs ordered are listed, but  only abnormal results are displayed) Labs Reviewed  SARS CORONAVIRUS 2 (TAT 6-24 HRS)    EKG   Radiology No results found.  Procedures Procedures (including critical care time)  Medications Ordered in UC Medications - No data to display  Initial Impression / Assessment and Plan / UC Course  I have reviewed the triage vital signs and the nursing notes.  Pertinent labs & imaging results that were available during my care of the patient were reviewed by me and considered in my medical decision making (see chart for details).    Discussed that he probably has a mild viral illness/cold. Recommend continue OTC cold and cough medication as needed. May also trial Sudafed decongestant as directed to help with sinus congestion. Continue to push fluids to help loosen up mucus. Note written for work. Follow-up pending COVID 19 test results.   Final Clinical Impressions(s) / UC Diagnoses   Final diagnoses:  Viral URI with cough     Discharge Instructions     Recommend continue OTC Nyquil/Dayquil as directed- may also try OTC Sudafed decongestant to help with sinus congestion. Continue to push fluids to help loosen up mucus. Follow-up pending COVID 19 test results.     ED Prescriptions    None     PDMP not reviewed this encounter.   Sudie Grumbling, NP 02/11/20 2108

## 2020-07-30 ENCOUNTER — Ambulatory Visit: Payer: BC Managed Care – PPO | Admitting: Family Medicine

## 2020-07-30 ENCOUNTER — Other Ambulatory Visit: Payer: Self-pay

## 2020-07-30 ENCOUNTER — Encounter: Payer: Self-pay | Admitting: Family Medicine

## 2020-07-30 VITALS — BP 133/76 | HR 72 | Temp 98.8°F | Wt 124.6 lb

## 2020-07-30 DIAGNOSIS — F339 Major depressive disorder, recurrent, unspecified: Secondary | ICD-10-CM | POA: Diagnosis not present

## 2020-07-30 DIAGNOSIS — R002 Palpitations: Secondary | ICD-10-CM

## 2020-07-30 DIAGNOSIS — F17290 Nicotine dependence, other tobacco product, uncomplicated: Secondary | ICD-10-CM | POA: Diagnosis not present

## 2020-07-30 DIAGNOSIS — N529 Male erectile dysfunction, unspecified: Secondary | ICD-10-CM | POA: Diagnosis not present

## 2020-07-30 MED ORDER — TADALAFIL 20 MG PO TABS: 10.0000 mg | ORAL_TABLET | ORAL | 11 refills | Status: DC | PRN

## 2020-07-30 MED ORDER — BUPROPION HCL ER (SR) 150 MG PO TB12: ORAL_TABLET | ORAL | 1 refills | Status: DC

## 2020-07-30 MED ORDER — HYDROXYZINE HCL 25 MG PO TABS
25.0000 mg | ORAL_TABLET | Freq: Three times a day (TID) | ORAL | 1 refills | Status: DC | PRN
Start: 2020-07-30 — End: 2020-09-28

## 2020-07-30 NOTE — Progress Notes (Signed)
BP 133/76   Pulse 72   Temp 98.8 F (37.1 C) (Oral)   Wt 124 lb 9.6 oz (56.5 kg)   SpO2 98%   BMI 20.73 kg/m    Subjective:    Patient ID: Rodney Myers, male    DOB: 06-Mar-1981, 40 y.o.   MRN: [id number]  HPI: Rodney Myers is a 40 y.o. male  Chief Complaint  Patient presents with  . Nicotine Dependence    Pt states he is here to discuss quit smoking and has other questions. Pt states some days are better than others when it comes to trying to stop smoking. Pt states he has been having issues with his anxiety such as thinking he may be having panic attacks to where his heart feels like it is about to pop out of his chest.   SMOKING CESSATION Smoking Status: everyday smoker Smoking Amount: 1-2ppds Smoking Onset: 2005-2006 Smoking Quit Date: not set Smoking triggers: stress Type of tobacco use: cigarettes Other household members who smoke: no Treatments attempted: chantix, cold [place], patches, gum  PALPITATIONS Duration: about 2 months ago- was really depressed at that time.  Symptom description: heart is beating out of his chest Duration of episode: 5+ minutes Frequency: a few times, maybe 1x every 2 weeks Activity when event occurred: at random Related to exertion: no Dyspnea: yes Chest pain: no Syncope: no Anxiety/stress: yes Nausea/vomiting: no Diaphoresis: no Coronary artery disease: no Congestive heart failure: no Arrhythmia:no Thyroid disease: no Caffeine intake: Heavy caffeine consumption Status:  worse Treatments attempted:none   ANXIETY/DEPRESSION Duration: 4 months Status:got worse and then got better this week Anxious mood: yes  Excessive worrying: yes Irritability: yes  Sweating: no Nausea: no Palpitations:yes Hyperventilation: yes Panic attacks: yes Agoraphobia: no  Obscessions/compulsions: no Depressed mood: yes Depression screen [place] 2/9 07/30/2020 12/27/2016 03/06/2015  Decreased Interest 2 1 1   Down, Depressed, Hopeless 2 1 1    PHQ - 2 Score 4 2 2   Altered sleeping 0 - 3  Tired, decreased energy 1 - 1  Change in appetite 3 - 0  Feeling bad or failure about yourself  2 - 0  Trouble concentrating 0 - 0  Moving slowly or fidgety/restless 0 - 0  Suicidal thoughts 1 - 0  PHQ-9 Score 11 - 6  Difficult doing work/chores Somewhat difficult - Not difficult at all   Anhedonia: no Weight changes: no Insomnia: no   Hypersomnia: no Fatigue/loss of energy: no Feelings of worthlessness: no Feelings of guilt: yes Impaired concentration/indecisiveness: yes Suicidal ideations: no  Crying spells: yes Recent Stressors/Life Changes: no   Relationship problems: no   Family stress: no     Financial stress: no    Job stress: no    Recent death/loss: no    Relevant past medical, surgical, family and social history reviewed and updated as indicated. Interim medical history since our last visit reviewed. Allergies and medications reviewed and updated.  Review of Systems  Constitutional: Negative.   Respiratory: Negative.   Cardiovascular: Negative.   Gastrointestinal: Negative.   Skin: Negative.   Neurological: Negative.   Psychiatric/Behavioral: Positive for agitation, dysphoric mood and sleep disturbance. Negative for behavioral problems, confusion, decreased concentration, hallucinations, self-injury and suicidal ideas. The patient is nervous/anxious. The patient is not hyperactive.     Per HPI unless specifically indicated above     Objective:    BP 133/76   Pulse 72   Temp 98.8 F (37.1 C) (Oral)   Wt 124 lb 9.6  oz (56.5 kg)   SpO2 98%   BMI 20.73 kg/m   Wt Readings from Last 3 Encounters:  07/30/20 124 lb 9.6 oz (56.5 kg)  02/11/20 120 lb (54.4 kg)  03/30/19 130 lb (59 kg)    Physical Exam Vitals and nursing note reviewed.  Constitutional:      General: He is not in acute distress.    Appearance: Normal appearance. He is not ill-appearing, toxic-appearing or diaphoretic.  HENT:     Head:  Normocephalic and atraumatic.     Right Ear: External ear normal.     Left Ear: External ear normal.     Nose: Nose normal.     Mouth/Throat:     Mouth: Mucous membranes are moist.     Pharynx: Oropharynx is clear.  Eyes:     General: No scleral icterus.       Right eye: No discharge.        Left eye: No discharge.     Extraocular Movements: Extraocular movements intact.     Conjunctiva/sclera: Conjunctivae normal.     Pupils: Pupils are equal, round, and reactive to light.  Cardiovascular:     Rate and Rhythm: Normal rate and regular rhythm.     Pulses: Normal pulses.     Heart sounds: Normal heart sounds. No murmur heard. No friction rub. No gallop.   Pulmonary:     Effort: Pulmonary effort is normal. No respiratory distress.     Breath sounds: Normal breath sounds. No stridor. No wheezing, rhonchi or rales.  Chest:     Chest wall: No tenderness.  Musculoskeletal:        General: Normal range of motion.     Cervical back: Normal range of motion and neck supple.  Skin:    General: Skin is warm and dry.     Capillary Refill: Capillary refill takes less than 2 seconds.     Coloration: Skin is not jaundiced or pale.     Findings: No bruising, erythema, lesion or rash.  Neurological:     General: No focal deficit present.     Mental Status: He is alert and oriented to person, place, and time. Mental status is at baseline.  Psychiatric:        Mood and Affect: Mood is anxious.        Behavior: Behavior normal.        Thought Content: Thought content normal.        Judgment: Judgment normal.     Results for orders placed or performed during the hospital encounter of 02/11/20  SARS CORONAVIRUS 2 (TAT 6-24 HRS) Nasopharyngeal Nasopharyngeal Swab   Specimen: Nasopharyngeal Swab  Result Value Ref Range   SARS Coronavirus 2 NEGATIVE NEGATIVE      Assessment & Plan:   Problem List Items Addressed This Visit      Other   Other tobacco product nicotine dependence,  uncomplicated    Would like to quit smoking. Will start wellbutrin. Call with any concerns. Continue to monitor.       Depression, recurrent (HCC) - Primary    Will start wellbutrin and hydroxyzine for anxiety. List of counselors given today. Call with any concerns. Recheck 2-4 weeks.       Relevant Medications   buPROPion (WELLBUTRIN SR) 150 MG 12 hr tablet   hydrOXYzine (ATARAX/VISTARIL) 25 MG tablet    Other Visit Diagnoses    Palpitations       EKG normal. Likely due to anxiety. Call with  any concerns. Continue to monitor.    Relevant Orders   EKG 12-Lead (Completed)   Erectile dysfunction, unspecified erectile dysfunction type       Will send through some levitra. Call with any concerns. Continue to monitor.        Follow up plan: Return in about 2 weeks (around 08/13/2020) for Physical.

## 2020-07-30 NOTE — Patient Instructions (Signed)
Steps to Quit Smoking Smoking tobacco is the leading cause of preventable death. It can affect almost every organ in the body. Smoking puts you and those around you at risk for developing many serious chronic diseases. Quitting smoking can be difficult, but it is one of the best things that you can do for your health. It is never too late to quit. How do I get ready to quit? When you decide to quit smoking, create a plan to help you succeed. Before you quit:  Pick a date to quit. Set a date within the next 2 weeks to give you time to prepare.  Write down the reasons why you are quitting. Keep this list in places where you will see it often.  Tell your family, friends, and co-workers that you are quitting. Support from your loved ones can make quitting easier.  Talk with your health care provider about your options for quitting smoking.  Find out what treatment options are covered by your health insurance.  Identify people, places, things, and activities that make you want to smoke (triggers). Avoid them. What first steps can I take to quit smoking?  Throw away all cigarettes at home, at work, and in your car.  Throw away smoking accessories, such as ashtrays and lighters.  Clean your car. Make sure to empty the ashtray.  Clean your home, including curtains and carpets. What strategies can I use to quit smoking? Talk with your health care provider about combining strategies, such as taking medicines while you are also receiving in-person counseling. Using these two strategies together makes you more likely to succeed in quitting than if you used either strategy on its own.  If you are pregnant or breastfeeding, talk with your health care provider about finding counseling or other support strategies to quit smoking. Do not take medicine to help you quit smoking unless your health care provider tells you to do so. To quit smoking: Quit right away  Quit smoking completely, instead of  gradually reducing how much you smoke over a period of time. Research shows that stopping smoking right away is more successful than gradually quitting.  Attend in-person counseling to help you build problem-solving skills. You are more likely to succeed in quitting if you attend counseling sessions regularly. Even short sessions of 10 minutes can be effective. Take medicine You may take medicines to help you quit smoking. Some medicines require a prescription and some you can purchase over-the-counter. Medicines may have nicotine in them to replace the nicotine in cigarettes. Medicines may:  Help to stop cravings.  Help to relieve withdrawal symptoms. Your health care provider may recommend:  Nicotine patches, gum, or lozenges.  Nicotine inhalers or sprays.  Non-nicotine medicine that is taken by mouth. Find resources Find resources and support systems that can help you to quit smoking and remain smoke-free after you quit. These resources are most helpful when you use them often. They include:  Online chats with a counselor.  Telephone quitlines.  Printed self-help materials.  Support groups or group counseling.  Text messaging programs.  Mobile phone apps or applications. Use apps that can help you stick to your quit plan by providing reminders, tips, and encouragement. There are many free apps for mobile devices as well as websites. Examples include Quit Guide from the CDC and smokefree.gov   What things can I do to make it easier to quit?  Reach out to your family and friends for support and encouragement. Call telephone quitlines (1-800-QUIT-NOW),   reach out to support groups, or work with a Veterinary surgeon for support.  Ask people who smoke to avoid smoking around you.  Avoid places that trigger you to smoke, such as bars, parties, or smoke-break areas at work.  Spend time with people who do not smoke.  Lessen the stress in your life. Stress can be a smoking trigger for some  people. To lessen stress, try: ? Exercising regularly. ? Doing deep-breathing exercises. ? Doing yoga. ? Meditating. ? Performing a body scan. This involves closing your eyes, scanning your body from head to toe, and noticing which parts of your body are particularly tense. Try to relax the muscles in those areas.   How will I feel when I quit smoking? Day 1 to 3 weeks Within the first 24 hours of quitting smoking, you may start to feel withdrawal symptoms. These symptoms are usually most noticeable 2-3 days after quitting, but they usually do not last for more than 2-3 weeks. You may experience these symptoms:  Mood swings.  Restlessness, anxiety, or irritability.  Trouble concentrating.  Dizziness.  Strong cravings for sugary foods and nicotine.  Mild weight gain.  Constipation.  Nausea.  Coughing or a sore throat.  Changes in how the medicines that you take for unrelated issues work in your body.  Depression.  Trouble sleeping (insomnia). Week 3 and afterward After the first 2-3 weeks of quitting, you may start to notice more positive results, such as:  Improved sense of smell and taste.  Decreased coughing and sore throat.  Slower heart rate.  Lower blood pressure.  Clearer skin.  The ability to breathe more easily.  Fewer sick days. Quitting smoking can be very challenging. Do not get discouraged if you are not successful the first time. Some people need to make many attempts to quit before they achieve long-term success. Do your best to stick to your quit plan, and talk with your health care provider if you have any questions or concerns. Summary  Smoking tobacco is the leading cause of preventable death. Quitting smoking is one of the best things that you can do for your health.  When you decide to quit smoking, create a plan to help you succeed.  Quit smoking right away, not slowly over a period of time.  When you start quitting, seek help from your  health care provider, family, or friends. This information is not intended to replace advice given to you by your health care provider. Make sure you discuss any questions you have with your health care provider. Document Revised: 02/22/2019 Document Reviewed: 08/18/2018 Elsevier Patient Education  2021 Elsevier Inc.  Managing Anxiety, Adult After being diagnosed with an anxiety disorder, you may be relieved to know why you have felt or behaved a certain way. You may also feel overwhelmed about the treatment ahead and what it will mean for your life. With care and support, you can manage this condition and recover from it. How to manage lifestyle changes Managing stress and anxiety Stress is your body's reaction to life changes and events, both good and bad. Most stress will last just a few hours, but stress can be ongoing and can lead to more than just stress. Although stress can play a major role in anxiety, it is not the same as anxiety. Stress is usually caused by something external, such as a deadline, test, or competition. Stress normally passes after the triggering event has ended.  Anxiety is caused by something internal, such as imagining a  terrible outcome or worrying that something will go wrong that will devastate you. Anxiety often does not go away even after the triggering event is over, and it can become long-term (chronic) worry. It is important to understand the differences between stress and anxiety and to manage your stress effectively so that it does not lead to an anxious response. Talk with your health care provider or a counselor to learn more about reducing anxiety and stress. He or she may suggest tension reduction techniques, such as:  Music therapy. This can include creating or listening to music that you enjoy and that inspires you.  Mindfulness-based meditation. This involves being aware of your normal breaths while not trying to control your breathing. It can be done  while sitting or walking.  Centering prayer. This involves focusing on a word, phrase, or sacred image that means something to you and brings you peace.  Deep breathing. To do this, expand your stomach and inhale slowly through your nose. Hold your breath for 3-5 seconds. Then exhale slowly, letting your stomach muscles relax.  Self-talk. This involves identifying thought patterns that lead to anxiety reactions and changing those patterns.  Muscle relaxation. This involves tensing muscles and then relaxing them. Choose a tension reduction technique that suits your lifestyle and personality. These techniques take time and practice. Set aside 5-15 minutes a day to do them. Therapists can offer counseling and training in these techniques. The training to help with anxiety may be covered by some insurance plans. Other things you can do to manage stress and anxiety include:  Keeping a stress/anxiety diary. This can help you learn what triggers your reaction and then learn ways to manage your response.  Thinking about how you react to certain situations. You may not be able to control everything, but you can control your response.  Making time for activities that help you relax and not feeling guilty about spending your time in this way.  Visual imagery and yoga can help you stay calm and relax.   Medicines Medicines can help ease symptoms. Medicines for anxiety include:  Anti-anxiety drugs.  Antidepressants. Medicines are often used as a primary treatment for anxiety disorder. Medicines will be prescribed by a health care provider. When used together, medicines, psychotherapy, and tension reduction techniques may be the most effective treatment. Relationships Relationships can play a big part in helping you recover. Try to spend more time connecting with trusted friends and family members. Consider going to couples counseling, taking family education classes, or going to family therapy. Therapy  can help you and others better understand your condition. How to recognize changes in your anxiety Everyone responds differently to treatment for anxiety. Recovery from anxiety happens when symptoms decrease and stop interfering with your daily activities at home or work. This may mean that you will start to:  Have better concentration and focus. Worry will interfere less in your daily thinking.  Sleep better.  Be less irritable.  Have more energy.  Have improved memory. It is important to recognize when your condition is getting worse. Contact your health care provider if your symptoms interfere with home or work and you feel like your condition is not improving. Follow these instructions at home: Activity  Exercise. Most adults should do the following: ? Exercise for at least 150 minutes each week. The exercise should increase your heart rate and make you sweat (moderate-intensity exercise). ? Strengthening exercises at least twice a week.  Get the right amount and quality of  sleep. Most adults need 7-9 hours of sleep each night. Lifestyle  Eat a healthy diet that includes plenty of vegetables, fruits, whole grains, low-fat dairy products, and lean protein. Do not eat a lot of foods that are high in solid fats, added sugars, or salt.  Make choices that simplify your life.  Do not use any products that contain nicotine or tobacco, such as cigarettes, e-cigarettes, and chewing tobacco. If you need help quitting, ask your health care provider.  Avoid caffeine, alcohol, and certain over-the-counter cold medicines. These may make you feel worse. Ask your pharmacist which medicines to avoid.   General instructions  Take over-the-counter and prescription medicines only as told by your health care provider.  Keep all follow-up visits as told by your health care provider. This is important. Where to find support You can get help and support from these sources:  Self-help  groups.  Online and [employer].  A trusted spiritual leader.  Couples counseling.  Family education classes.  Family therapy. Where to find more information You may find that joining a support group helps you deal with your anxiety. The following sources can help you locate counselors or support groups near you:  Mental Health America: www.mentalhealthamerica.net  Anxiety and Depression Association of [place] (ADAA): ProgramCam.de  [employer] on Mental Illness (NAMI): www.nami.org Contact a health care provider if you:  Have a hard time staying focused or finishing daily tasks.  Spend many hours a day feeling worried about everyday life.  Become exhausted by worry.  Start to have headaches, feel tense, or have nausea.  Urinate more than normal.  Have diarrhea. Get help right away if you have:  A racing heart and shortness of breath.  Thoughts of hurting yourself or others. If you ever feel like you may hurt yourself or others, or have thoughts about taking your own life, get help right away. You can go to your nearest emergency department or call:  Your local emergency services (911 in the [place]  A suicide crisis helpline, such as the National Suicide Prevention Lifeline at (419)368-5824. This is open 24 hours a day. Summary  Taking steps to learn and use tension reduction techniques can help calm you and help prevent triggering an anxiety reaction.  When used together, medicines, psychotherapy, and tension reduction techniques may be the most effective treatment.  Family, friends, and partners can play a big part in helping you recover from an anxiety disorder. This information is not intended to replace advice given to you by your health care provider. Make sure you discuss any questions you have with your health care provider. Document Revised: 10/30/2018 Document Reviewed: 10/30/2018 Elsevier Patient Education  2021 [employer].

## 2020-08-02 DIAGNOSIS — F339 Major depressive disorder, recurrent, unspecified: Secondary | ICD-10-CM | POA: Insufficient documentation

## 2020-08-02 NOTE — Assessment & Plan Note (Signed)
Will start wellbutrin and hydroxyzine for anxiety. List of counselors given today. Call with any concerns. Recheck 2-4 weeks.

## 2020-08-02 NOTE — Assessment & Plan Note (Signed)
Would like to quit smoking. Will start wellbutrin. Call with any concerns. Continue to monitor.

## 2020-08-02 NOTE — Progress Notes (Signed)
Interpreted by me on 07/30/20. Sinus brady at 59bpm, no ST segment changes.

## 2020-08-13 ENCOUNTER — Encounter: Payer: Self-pay | Admitting: Family Medicine

## 2020-08-13 ENCOUNTER — Ambulatory Visit: Payer: BC Managed Care – PPO | Admitting: Family Medicine

## 2020-08-13 ENCOUNTER — Other Ambulatory Visit: Payer: Self-pay

## 2020-08-13 VITALS — BP 139/89 | HR 78 | Temp 98.2°F | Wt 121.8 lb

## 2020-08-13 DIAGNOSIS — F17290 Nicotine dependence, other tobacco product, uncomplicated: Secondary | ICD-10-CM | POA: Diagnosis not present

## 2020-08-13 DIAGNOSIS — F339 Major depressive disorder, recurrent, unspecified: Secondary | ICD-10-CM | POA: Diagnosis not present

## 2020-08-13 NOTE — Progress Notes (Signed)
BP 139/89   Pulse 78   Temp 98.2 F (36.8 C)   Wt 121 lb 12.8 oz (55.2 kg)   SpO2 98%   BMI 20.27 kg/m    Subjective:    Patient ID: Rodney Myers, male    DOB: 06-Aug-1980, 40 y.o.   MRN: [id number]  HPI: Rodney Myers is a 40 y.o. male  Chief Complaint  Patient presents with  . Depression    Follow up   . Nicotine Dependence    Follow up    DEPRESSION- had a bad reaction to the wellbutrin, made him really irritable.  Mood status: uncontrolled Satisfied with current treatment?: no Symptom severity: moderate  Duration of current treatment : chronic Side effects: yes Medication compliance: fair compliance Psychotherapy/counseling: no  Previous psychiatric medications: prozac, wellbutrin Depressed mood: yes Anxious mood: yes Anhedonia: no Significant weight loss or gain: no Insomnia: no  Fatigue: yes Feelings of worthlessness or guilt: yes Impaired concentration/indecisiveness: yes Suicidal ideations: no Hopelessness: no Crying spells: yes Depression screen [facility] 2/9 07/30/2020 12/27/2016 03/06/2015  Decreased Interest 2 1 1   Down, Depressed, Hopeless 2 1 1   PHQ - 2 Score 4 2 2   Altered sleeping 0 - 3  Tired, decreased energy 1 - 1  Change in appetite 3 - 0  Feeling bad or failure about yourself  2 - 0  Trouble concentrating 0 - 0  Moving slowly or fidgety/restless 0 - 0  Suicidal thoughts 1 - 0  PHQ-9 Score 11 - 6  Difficult doing work/chores Somewhat difficult - Not difficult at all     Relevant past medical, surgical, family and social history reviewed and updated as indicated. Interim medical history since our last visit reviewed. Allergies and medications reviewed and updated.  Review of Systems  Constitutional: Negative.   Respiratory: Negative.   Cardiovascular: Negative.   Gastrointestinal: Negative.   Musculoskeletal: Negative.   Psychiatric/Behavioral: Positive for agitation and dysphoric mood. Negative for behavioral problems, confusion,  decreased concentration, hallucinations, self-injury, sleep disturbance and suicidal ideas. The patient is nervous/anxious. The patient is not hyperactive.     Per HPI unless specifically indicated above     Objective:    BP 139/89   Pulse 78   Temp 98.2 F (36.8 C)   Wt 121 lb 12.8 oz (55.2 kg)   SpO2 98%   BMI 20.27 kg/m   Wt Readings from Last 3 Encounters:  08/13/20 121 lb 12.8 oz (55.2 kg)  07/30/20 124 lb 9.6 oz (56.5 kg)  02/11/20 120 lb (54.4 kg)    Physical Exam Vitals and nursing note reviewed.  Constitutional:      General: He is not in acute distress.    Appearance: Normal appearance. He is not ill-appearing, toxic-appearing or diaphoretic.  HENT:     Head: Normocephalic and atraumatic.     Right Ear: External ear normal.     Left Ear: External ear normal.     Nose: Nose normal.     Mouth/Throat:     Mouth: Mucous membranes are moist.     Pharynx: Oropharynx is clear.  Eyes:     General: No scleral icterus.       Right eye: No discharge.        Left eye: No discharge.     Extraocular Movements: Extraocular movements intact.     Conjunctiva/sclera: Conjunctivae normal.     Pupils: Pupils are equal, round, and reactive to light.  Cardiovascular:     Rate and  Rhythm: Normal rate and regular rhythm.     Pulses: Normal pulses.     Heart sounds: Normal heart sounds. No murmur heard. No friction rub. No gallop.   Pulmonary:     Effort: Pulmonary effort is normal. No respiratory distress.     Breath sounds: Normal breath sounds. No stridor. No wheezing, rhonchi or rales.  Chest:     Chest wall: No tenderness.  Musculoskeletal:        General: Normal range of motion.     Cervical back: Normal range of motion and neck supple.  Skin:    General: Skin is warm and dry.     Capillary Refill: Capillary refill takes less than 2 seconds.     Coloration: Skin is not jaundiced or pale.     Findings: No bruising, erythema, lesion or rash.  Neurological:      General: No focal deficit present.     Mental Status: He is alert and oriented to person, place, and time. Mental status is at baseline.  Psychiatric:        Mood and Affect: Mood normal.        Behavior: Behavior normal.        Thought Content: Thought content normal.        Judgment: Judgment normal.     Results for orders placed or performed during the hospital encounter of 02/11/20  SARS CORONAVIRUS 2 (TAT 6-24 HRS) Nasopharyngeal Nasopharyngeal Swab   Specimen: Nasopharyngeal Swab  Result Value Ref Range   SARS Coronavirus 2 NEGATIVE NEGATIVE      Assessment & Plan:   Problem List Items Addressed This Visit      Other   Other tobacco product nicotine dependence, uncomplicated    Could not tolerate wellbutrin or chantix. Continue to work on cutting down and using tobacco replacements. Call with any concerns. Continue to monitor.       Depression, recurrent (HCC) - Primary    Was not able to tolerate wellbutrin. Will change to lexapro and recheck 1 month. Call with any concerns. Continue to monitor.       Relevant Medications   escitalopram (LEXAPRO) 5 MG tablet       Follow up plan: Return in about 4 weeks (around 09/10/2020).

## 2020-08-14 MED ORDER — ESCITALOPRAM OXALATE 5 MG PO TABS: ORAL_TABLET | ORAL | 3 refills | Status: DC

## 2020-08-14 NOTE — Assessment & Plan Note (Signed)
Could not tolerate wellbutrin or chantix. Continue to work on cutting down and using tobacco replacements. Call with any concerns. Continue to monitor.

## 2020-08-14 NOTE — Assessment & Plan Note (Signed)
Was not able to tolerate wellbutrin. Will change to lexapro and recheck 1 month. Call with any concerns. Continue to monitor.

## 2020-09-10 ENCOUNTER — Ambulatory Visit: Payer: BC Managed Care – PPO | Admitting: Family Medicine

## 2020-09-19 ENCOUNTER — Ambulatory Visit
Admission: EM | Admit: 2020-09-19 | Discharge: 2020-09-19 | Disposition: A | Discharge status: Home / Self Care | Payer: BC Managed Care – PPO

## 2020-09-19 ENCOUNTER — Other Ambulatory Visit: Payer: Self-pay

## 2020-09-19 DIAGNOSIS — K529 Noninfective gastroenteritis and colitis, unspecified: Secondary | ICD-10-CM | POA: Diagnosis not present

## 2020-09-19 NOTE — ED Provider Notes (Signed)
[facility]    CSN: [id number] Arrival date & time: 09/19/20  1130      History   Chief Complaint Chief Complaint  Patient presents with  . Letter for School/Work    HPI Rodney Myers is a 40 y.o. male.   HPI   41 year old male here for clearance to return to work.  Patient reports that this past week his 2 daughters had a GI bug with nausea, vomiting, diarrhea and then he picked up a course of diarrhea without nausea or vomiting.  Patient reports that all of his GI symptoms have resolved and that all he has now with some residual body soreness.  Patient denies fever or abdominal pain.  Past Medical History:  Diagnosis Date  . Allergy   . Anxiety   . GERD (gastroesophageal reflux disease)   . Hypertension   . Kidney calculi     Patient Active Problem List   Diagnosis Date Noted  . Depression, recurrent (HCC) 08/02/2020  . Tympanosclerosis of both ears 11/24/2014  . Situational anxiety 11/24/2014  . GERD (gastroesophageal reflux disease) 11/24/2014  . Other tobacco product nicotine dependence, uncomplicated 11/24/2014  . Allergic rhinitis 11/24/2014    Past Surgical History:  Procedure Laterality Date  . ROOT CANAL  07/2014       Home Medications    Prior to Admission medications   Medication Sig Start Date End Date Taking? Authorizing Provider  escitalopram (LEXAPRO) 5 MG tablet 1/2 tab daily for 1 week, then 1 tab daily after that 08/14/20  Yes Johnson, Megan P, DO  hydrOXYzine (ATARAX/VISTARIL) 25 MG tablet Take 1 tablet (25 mg total) by mouth 3 (three) times daily as needed. 07/30/20  Yes Johnson, Megan P, DO  tadalafil (CIALIS) 20 MG tablet Take 0.5-1 tablets (10-20 mg total) by mouth every other day as needed for erectile dysfunction. 07/30/20  Yes [name], DO    Family History Family History  Problem Relation Age of Onset  . Cancer Maternal Grandfather        nasal  . Hypertension Mother   . Other Father   . Diabetes  Maternal Grandmother   . Heart disease Paternal Grandfather     Social History Social History   Tobacco Use  . Smoking status: Current Every Day Smoker    Packs/day: 1.00    Types: Cigarettes  . Smokeless tobacco: Never Used  Vaping Use  . Vaping Use: Former  Substance Use Topics  . Alcohol use: Yes    Alcohol/week: 0.0 standard drinks    Comment: pt states very rare  . Drug use: Yes    Frequency: 12.0 times per week    Types: Marijuana    Comment: 1-2 times per day     Allergies   Wellbutrin [bupropion]   Review of Systems Review of Systems  Constitutional: Negative for fever.  Gastrointestinal: Positive for diarrhea. Negative for abdominal pain, nausea and vomiting.  Musculoskeletal: Negative for arthralgias and myalgias.  Skin: Negative for rash.  Hematological: Negative.   Psychiatric/Behavioral: Negative.      Physical Exam Triage Vital Signs ED Triage Vitals  Enc Vitals Group     BP 09/19/20 1140 122/89     Pulse Rate 09/19/20 1140 82     Resp 09/19/20 1140 17     Temp 09/19/20 1140 97.8 F (36.6 C)     Temp Source 09/19/20 1140 Oral     SpO2 09/19/20 1140 100 %     Weight 09/19/20  1139 120 lb (54.4 kg)     Height 09/19/20 1139 5\' 5"  (1.651 m)     Head Circumference --      Peak Flow --      Pain Score 09/19/20 1139 0     Pain Loc --      Pain Edu? --      Excl. in GC? --    No data found.  Updated Vital Signs BP 122/89 (BP Location: Right Arm)   Pulse 82   Temp 97.8 F (36.6 C) (Oral)   Resp 17   Ht 5\' 5"  (1.651 m)   Wt 120 lb (54.4 kg)   SpO2 100%   BMI 19.97 kg/m   Visual Acuity Right Eye Distance:   Left Eye Distance:   Bilateral Distance:    Right Eye Near:   Left Eye Near:    Bilateral Near:     Physical Exam Vitals and nursing note reviewed.  Constitutional:      General: He is not in acute distress.    Appearance: Normal appearance. He is normal weight. He is not ill-appearing.  HENT:     Head: Normocephalic and  atraumatic.  Cardiovascular:     Rate and Rhythm: Normal rate and regular rhythm.     Pulses: Normal pulses.     Heart sounds: Normal heart sounds. No murmur heard. No gallop.   Pulmonary:     Effort: Pulmonary effort is normal.     Breath sounds: Normal breath sounds. No wheezing, rhonchi or rales.  Skin:    General: Skin is warm and dry.     Capillary Refill: Capillary refill takes less than 2 seconds.     Findings: No erythema.  Neurological:     General: No focal deficit present.     Mental Status: He is alert and oriented to person, place, and time.  Psychiatric:        Mood and Affect: Mood normal.        Behavior: Behavior normal.        Thought Content: Thought content normal.        Judgment: Judgment normal.      UC Treatments / Results  Labs (all labs ordered are listed, but only abnormal results are displayed) Labs Reviewed - No data to display  EKG   Radiology No results found.  Procedures Procedures (including critical care time)  Medications Ordered in UC Medications - No data to display  Initial Impression / Assessment and Plan / UC Course  I have reviewed the triage vital signs and the nursing notes.  Pertinent labs & imaging results that were available during my care of the patient were reviewed by me and considered in my medical decision making (see chart for details).   Patient is a very pleasant 40 year old male here because he needs a note to return to work after being out with a GI bug last week.  Patient is currently asymptomatic.  Patient's physical exam reveals a normal cardiopulmonary exam, skin is warm and dry with normal turgor.  Patient is in no acute distress.  Patient's physical exam is within normal limits.   Final Clinical Impressions(s) / UC Diagnoses   Final diagnoses:  Noninfectious gastroenteritis, unspecified type     Discharge Instructions     You are cleared to return to work.    ED Prescriptions    None      PDMP not reviewed this encounter.   , NP 09/19/20 1154

## 2020-09-19 NOTE — ED Triage Notes (Signed)
Patient reports that he has been having GI/stomach bug issues since Wednesday. Patient states that he needs a note for work, currently without symptoms.

## 2020-09-19 NOTE — Discharge Instructions (Addendum)
You are cleared to return to work.

## 2020-09-28 ENCOUNTER — Other Ambulatory Visit: Payer: Self-pay

## 2020-09-28 ENCOUNTER — Encounter: Payer: Self-pay | Admitting: Family Medicine

## 2020-09-28 ENCOUNTER — Ambulatory Visit: Payer: BC Managed Care – PPO | Admitting: Family Medicine

## 2020-09-28 VITALS — BP 122/72 | HR 73 | Temp 98.4°F | Wt 127.2 lb

## 2020-09-28 DIAGNOSIS — F339 Major depressive disorder, recurrent, unspecified: Secondary | ICD-10-CM

## 2020-09-28 MED ORDER — HYDROXYZINE HCL 25 MG PO TABS: 25.0000 mg | ORAL_TABLET | Freq: Three times a day (TID) | ORAL | 1 refills | Status: DC | PRN

## 2020-09-28 MED ORDER — ESCITALOPRAM OXALATE 5 MG PO TABS: ORAL_TABLET | ORAL | 3 refills | Status: DC

## 2020-09-28 NOTE — Assessment & Plan Note (Signed)
States that he is doing better despite essentially stable PHQ9. He is hesitant to take his lexapro as he's nervous about side effects. Will keep it at home and if starts getting worse, will start it. Continue to try to find a counselor. We will recheck in about 4 months to see how he's doing.

## 2020-09-28 NOTE — Progress Notes (Signed)
BP 122/72   Pulse 73   Temp 98.4 F (36.9 C)   Wt 127 lb 3.2 oz (57.7 kg)   SpO2 99%   BMI 21.17 kg/m    Subjective:    Patient ID: Rodney Myers, male    DOB: 10-06-1980, 40 y.o.   MRN: [id number]  HPI: Rodney Myers is a 40 y.o. male  Chief Complaint  Patient presents with  . Depression   DEPRESSION- didn't take his lexapro. Has not seen a therapist. Duration: chronic Mood status: uncontrolled Satisfied with current treatment?: no Symptom severity: mild  Duration of current treatment : not on anything Side effects: no Medication compliance: poor compliance Psychotherapy/counseling: no  Previous psychiatric medications: hydroxyzine Depressed mood: yes Anxious mood: yes Anhedonia: no Significant weight loss or gain: no Insomnia: yes  Fatigue: yes Feelings of worthlessness or guilt: no Impaired concentration/indecisiveness: no Suicidal ideations: yes Hopelessness: yes Crying spells: yes Depression screen [facility] 2/9 09/28/2020 07/30/2020 12/27/2016 03/06/2015  Decreased Interest 0 2 1 1   Down, Depressed, Hopeless 1 2 1 1   PHQ - 2 Score 1 4 2 2   Altered sleeping 0 0 - 3  Tired, decreased energy 1 1 - 1  Change in appetite 1 3 - 0  Feeling bad or failure about yourself  2 2 - 0  Trouble concentrating 1 0 - 0  Moving slowly or fidgety/restless 2 0 - 0  Suicidal thoughts 1 1 - 0  PHQ-9 Score 9 11 - 6  Difficult doing work/chores Somewhat difficult Somewhat difficult - Not difficult at all    Relevant past medical, surgical, family and social history reviewed and updated as indicated. Interim medical history since our last visit reviewed. Allergies and medications reviewed and updated.  Review of Systems  Constitutional: Negative.   Respiratory: Negative.   Cardiovascular: Negative.   Gastrointestinal: Negative.   Neurological: Negative.   Psychiatric/Behavioral: Positive for dysphoric mood. Negative for agitation, behavioral problems, confusion, decreased  concentration, hallucinations, self-injury, sleep disturbance and suicidal ideas. The patient is nervous/anxious. The patient is not hyperactive.     Per HPI unless specifically indicated above     Objective:    BP 122/72   Pulse 73   Temp 98.4 F (36.9 C)   Wt 127 lb 3.2 oz (57.7 kg)   SpO2 99%   BMI 21.17 kg/m   Wt Readings from Last 3 Encounters:  09/28/20 127 lb 3.2 oz (57.7 kg)  09/19/20 120 lb (54.4 kg)  08/13/20 121 lb 12.8 oz (55.2 kg)    Physical Exam Vitals and nursing note reviewed.  Constitutional:      General: He is not in acute distress.    Appearance: Normal appearance. He is not ill-appearing, toxic-appearing or diaphoretic.  HENT:     Head: Normocephalic and atraumatic.     Right Ear: External ear normal.     Left Ear: External ear normal.     Nose: Nose normal.     Mouth/Throat:     Mouth: Mucous membranes are moist.     Pharynx: Oropharynx is clear.  Eyes:     General: No scleral icterus.       Right eye: No discharge.        Left eye: No discharge.     Extraocular Movements: Extraocular movements intact.     Conjunctiva/sclera: Conjunctivae normal.     Pupils: Pupils are equal, round, and reactive to light.  Cardiovascular:     Rate and Rhythm: Normal rate and  regular rhythm.     Pulses: Normal pulses.     Heart sounds: Normal heart sounds. No murmur heard. No friction rub. No gallop.   Pulmonary:     Effort: Pulmonary effort is normal. No respiratory distress.     Breath sounds: Normal breath sounds. No stridor. No wheezing, rhonchi or rales.  Chest:     Chest wall: No tenderness.  Musculoskeletal:        General: Normal range of motion.     Cervical back: Normal range of motion and neck supple.  Skin:    General: Skin is warm and dry.     Capillary Refill: Capillary refill takes less than 2 seconds.     Coloration: Skin is not jaundiced or pale.     Findings: No bruising, erythema, lesion or rash.  Neurological:     General: No focal  deficit present.     Mental Status: He is alert and oriented to person, place, and time. Mental status is at baseline.  Psychiatric:        Mood and Affect: Mood normal.        Behavior: Behavior normal.        Thought Content: Thought content normal.        Judgment: Judgment normal.     Results for orders placed or performed during the hospital encounter of 02/11/20  SARS CORONAVIRUS 2 (TAT 6-24 HRS) Nasopharyngeal Nasopharyngeal Swab   Specimen: Nasopharyngeal Swab  Result Value Ref Range   SARS Coronavirus 2 NEGATIVE NEGATIVE      Assessment & Plan:   Problem List Items Addressed This Visit      Other   Depression, recurrent (HCC) - Primary    States that he is doing better despite essentially stable PHQ9. He is hesitant to take his lexapro as he's nervous about side effects. Will keep it at home and if starts getting worse, will start it. Continue to try to find a counselor. We will recheck in about 4 months to see how he's doing.       Relevant Medications   hydrOXYzine (ATARAX/VISTARIL) 25 MG tablet   escitalopram (LEXAPRO) 5 MG tablet       Follow up plan: Return in about 4 months (around 01/28/2021).

## 2020-11-25 DIAGNOSIS — F419 Anxiety disorder, unspecified: Secondary | ICD-10-CM | POA: Diagnosis not present

## 2020-11-30 DIAGNOSIS — F419 Anxiety disorder, unspecified: Secondary | ICD-10-CM | POA: Diagnosis not present

## 2020-12-10 DIAGNOSIS — F419 Anxiety disorder, unspecified: Secondary | ICD-10-CM | POA: Diagnosis not present

## 2020-12-24 DIAGNOSIS — F419 Anxiety disorder, unspecified: Secondary | ICD-10-CM | POA: Diagnosis not present

## 2021-01-06 DIAGNOSIS — F419 Anxiety disorder, unspecified: Secondary | ICD-10-CM | POA: Diagnosis not present

## 2021-01-21 DIAGNOSIS — F419 Anxiety disorder, unspecified: Secondary | ICD-10-CM | POA: Diagnosis not present

## 2021-01-28 ENCOUNTER — Ambulatory Visit: Payer: BC Managed Care – PPO | Admitting: Internal Medicine

## 2021-02-04 DIAGNOSIS — Z720 Tobacco use: Secondary | ICD-10-CM | POA: Diagnosis not present

## 2021-02-04 DIAGNOSIS — F411 Generalized anxiety disorder: Secondary | ICD-10-CM | POA: Diagnosis not present

## 2021-02-23 DIAGNOSIS — F411 Generalized anxiety disorder: Secondary | ICD-10-CM | POA: Diagnosis not present

## 2021-02-26 ENCOUNTER — Ambulatory Visit: Payer: BC Managed Care – PPO | Admitting: Internal Medicine

## 2021-03-09 ENCOUNTER — Other Ambulatory Visit: Payer: Self-pay

## 2021-03-09 ENCOUNTER — Ambulatory Visit: Payer: BC Managed Care – PPO | Admitting: Internal Medicine

## 2021-03-09 ENCOUNTER — Encounter: Payer: Self-pay | Admitting: Internal Medicine

## 2021-03-09 VITALS — BP 118/75 | HR 68 | Temp 98.5°F | Ht 65.35 in | Wt 136.0 lb

## 2021-03-09 DIAGNOSIS — W57XXXA Bitten or stung by nonvenomous insect and other nonvenomous arthropods, initial encounter: Secondary | ICD-10-CM | POA: Diagnosis not present

## 2021-03-09 DIAGNOSIS — F339 Major depressive disorder, recurrent, unspecified: Secondary | ICD-10-CM | POA: Diagnosis not present

## 2021-03-09 DIAGNOSIS — Z202 Contact with and (suspected) exposure to infections with a predominantly sexual mode of transmission: Secondary | ICD-10-CM | POA: Diagnosis not present

## 2021-03-09 DIAGNOSIS — F419 Anxiety disorder, unspecified: Secondary | ICD-10-CM

## 2021-03-09 DIAGNOSIS — Z23 Encounter for immunization: Secondary | ICD-10-CM | POA: Insufficient documentation

## 2021-03-09 MED ORDER — FLUCONAZOLE 150 MG PO TABS
300.0000 mg | ORAL_TABLET | ORAL | 0 refills | Status: DC
Start: 2021-03-09 — End: 2021-06-30

## 2021-03-09 MED ORDER — CITALOPRAM HYDROBROMIDE 10 MG PO TABS: 10.0000 mg | ORAL_TABLET | Freq: Every day | ORAL | 1 refills | Status: DC

## 2021-03-09 NOTE — Progress Notes (Signed)
Ht 5\' 5"  (1.651 m)   Wt 136 lb (61.7 kg)   BMI 22.63 kg/m    Subjective:    Patient ID: Rodney Myers, male    DOB: 03/28/1981, 40 y.o.   MRN: [id number]  Chief Complaint  Patient presents with   Depression   spots    Patient states that he has spots on his back    STD check    Wants to be checked for STD's.    HPI: Rodney Myers is a 40 y.o. male  Pt is here for a follow up.  Says he needs STD check, had been dating someone and now isnt. No penile dischareg/ sores. ulcers  Depression        This is a chronic (was placed on lexapro for such) problem.  Associated symptoms include sad.  Associated symptoms include no decreased concentration, no helplessness, no hopelessness, does not have insomnia, not irritable, no restlessness, no decreased interest, no indigestion and no suicidal ideas.( Has had panic attacks didn't take lexapro as he was rx per pt.  No problem with sleeping. ) Rash This is a chronic problem.   Chief Complaint  Patient presents with   Depression   spots    Patient states that he has spots on his back    STD check    Wants to be checked for STD's.    Relevant past medical, surgical, family and social history reviewed and updated as indicated. Interim medical history since our last visit reviewed. Allergies and medications reviewed and updated.  Review of Systems  Skin:  Positive for rash.  Psychiatric/Behavioral:  Positive for depression. Negative for decreased concentration and suicidal ideas. The patient does not have insomnia.    Per HPI unless specifically indicated above     Objective:    Ht 5\' 5"  (1.651 m)   Wt 136 lb (61.7 kg)   BMI 22.63 kg/m   Wt Readings from Last 3 Encounters:  03/09/21 136 lb (61.7 kg)  09/28/20 127 lb 3.2 oz (57.7 kg)  09/19/20 120 lb (54.4 kg)    Physical Exam Vitals and nursing note reviewed.  Constitutional:      General: He is not irritable.He is not in acute distress.    Appearance: Normal  appearance. He is not ill-appearing or diaphoretic.  HENT:     Head: Normocephalic and atraumatic.  Eyes:     Conjunctiva/sclera: Conjunctivae normal.     Pupils: Pupils are equal, round, and reactive to light.  Cardiovascular:     Rate and Rhythm: Normal rate and regular rhythm.     Heart sounds: No murmur heard.   No friction rub. No gallop.  Pulmonary:     Effort: No respiratory distress.     Breath sounds: No stridor. No wheezing or rhonchi.  Chest:     Chest wall: No tenderness.  Abdominal:     General: Abdomen is flat. Bowel sounds are normal.     Palpations: Abdomen is soft. There is no mass.     Tenderness: There is no abdominal tenderness.  Musculoskeletal:     Left lower leg: No edema.  Skin:    General: Skin is warm and dry.     Findings: Rash present.     Comments: Right side of the entire back with circular macules/ papulas hyperpigmented with some scalin   Neurological:     Mental Status: He is alert.  Psychiatric:        Mood and Affect:  Mood normal.        Behavior: Behavior normal.        Thought Content: Thought content normal.    Results for orders placed or performed during the hospital encounter of 02/11/20  SARS CORONAVIRUS 2 (TAT 6-24 HRS) Nasopharyngeal Nasopharyngeal Swab   Specimen: Nasopharyngeal Swab  Result Value Ref Range   SARS Coronavirus 2 NEGATIVE NEGATIVE        Current Outpatient Medications:    escitalopram (LEXAPRO) 5 MG tablet, 1/2 tab daily for 1 week, then 1 tab daily after that, Disp: 30 tablet, Rfl: 3   hydrOXYzine (ATARAX/VISTARIL) 25 MG tablet, Take 1 tablet (25 mg total) by mouth 3 (three) times daily as needed., Disp: 90 tablet, Rfl: 1   tadalafil (CIALIS) 20 MG tablet, Take 0.5-1 tablets (10-20 mg total) by mouth every other day as needed for erectile dysfunction., Disp: 5 tablet, Rfl: 11    Assessment & Plan:  Rash ? On back ? Sec to  tinea versicolor start pt on  antifungals as above  300  mg dose once weekly for two  week  To fu with dermatology   Tick bite :  Will check for RMSF and Lymes dz   Depression / anxiety Anxiety / depression : will start celexa 10 mg daily , potential Side effects dw pt. to call office if develops any SE. will fu with me in 1 month for such. pt verbalised understanding.     Problem List Items Addressed This Visit   None Visit Diagnoses     Need for influenza vaccination    -  Primary        No orders of the defined types were placed in this encounter.    No orders of the defined types were placed in this encounter.    Follow up plan: No follow-ups on file.

## 2021-03-10 DIAGNOSIS — F411 Generalized anxiety disorder: Secondary | ICD-10-CM | POA: Diagnosis not present

## 2021-03-10 NOTE — Progress Notes (Signed)
Pl let pt know labs so far are -ve

## 2021-03-10 NOTE — Progress Notes (Signed)
Patient made aware of results and verbalized understanding.  

## 2021-03-16 DIAGNOSIS — B360 Pityriasis versicolor: Secondary | ICD-10-CM | POA: Diagnosis not present

## 2021-03-16 LAB — COMPREHENSIVE METABOLIC PANEL
ALT: 16 IU/L (ref 0–44)
AST: 20 IU/L (ref 0–40)
Albumin/Globulin Ratio: 2.1 (ref 1.2–2.2)
Albumin: 5.1 g/dL — ABNORMAL HIGH (ref 4.0–5.0)
Alkaline Phosphatase: 74 IU/L (ref 44–121)
BUN/Creatinine Ratio: 13 (ref 9–20)
BUN: 13 mg/dL (ref 6–20)
Bilirubin Total: 0.6 mg/dL (ref 0.0–1.2)
CO2: 21 mmol/L (ref 20–29)
Calcium: 9.3 mg/dL (ref 8.7–10.2)
Chloride: 106 mmol/L (ref 96–106)
Creatinine, Ser: 0.99 mg/dL (ref 0.76–1.27)
Globulin, Total: 2.4 g/dL (ref 1.5–4.5)
Glucose: 92 mg/dL (ref 70–99)
Potassium: 4.4 mmol/L (ref 3.5–5.2)
Sodium: 141 mmol/L (ref 134–144)
Total Protein: 7.5 g/dL (ref 6.0–8.5)
eGFR: 99 mL/min/{1.73_m2} (ref >59)

## 2021-03-16 LAB — CBC WITH DIFFERENTIAL/PLATELET
Basophils Absolute: 0.1 10*3/uL (ref 0.0–0.2)
Basos: 1 %
EOS (ABSOLUTE): 0.4 10*3/uL (ref 0.0–0.4)
Eos: 6 %
Hematocrit: 48 % (ref 37.5–51.0)
Hemoglobin: 16.6 g/dL (ref 13.0–17.7)
Immature Grans (Abs): 0 10*3/uL (ref 0.0–0.1)
Immature Granulocytes: 0 %
Lymphocytes Absolute: 1.9 10*3/uL (ref 0.7–3.1)
Lymphs: 26 %
MCH: 29.6 pg (ref 26.6–33.0)
MCHC: 34.6 g/dL (ref 31.5–35.7)
MCV: 86 fL (ref 79–97)
Monocytes Absolute: 0.7 10*3/uL (ref 0.1–0.9)
Monocytes: 9 %
Neutrophils Absolute: 4.3 10*3/uL (ref 1.4–7.0)
Neutrophils: 58 %
Platelets: 216 10*3/uL (ref 150–450)
RBC: 5.6 x10E6/uL (ref 4.14–5.80)
RDW: 12.3 % (ref 11.6–15.4)
WBC: 7.3 10*3/uL (ref 3.4–10.8)

## 2021-03-16 LAB — ROCKY MTN SPOTTED FVR ABS PNL(IGG+IGM)
RMSF IgG: POSITIVE — AB
RMSF IgM: 0.27 index (ref 0.00–0.89)

## 2021-03-16 LAB — LYME DISEASE SEROLOGY W/REFLEX: Lyme Total Antibody EIA: NEGATIVE

## 2021-03-16 LAB — RMSF, IGG, IFA: RMSF, IGG, IFA: <1:64 {titer}

## 2021-03-16 LAB — HIV ANTIBODY (ROUTINE TESTING W REFLEX): HIV Screen 4th Generation wRfx: NONREACTIVE

## 2021-03-16 LAB — RPR: RPR Ser Ql: NONREACTIVE

## 2021-03-22 ENCOUNTER — Other Ambulatory Visit: Payer: Self-pay

## 2021-03-22 ENCOUNTER — Other Ambulatory Visit: Payer: BC Managed Care – PPO

## 2021-03-22 DIAGNOSIS — Z0000 Encounter for general adult medical examination without abnormal findings: Secondary | ICD-10-CM | POA: Diagnosis not present

## 2021-03-23 DIAGNOSIS — F411 Generalized anxiety disorder: Secondary | ICD-10-CM | POA: Diagnosis not present

## 2021-03-23 LAB — COMPREHENSIVE METABOLIC PANEL
ALT: 13 IU/L (ref 0–44)
AST: 15 IU/L (ref 0–40)
Albumin/Globulin Ratio: 2 (ref 1.2–2.2)
Albumin: 4.7 g/dL (ref 4.0–5.0)
Alkaline Phosphatase: 88 IU/L (ref 44–121)
BUN/Creatinine Ratio: 11 (ref 9–20)
BUN: 11 mg/dL (ref 6–20)
Bilirubin Total: 0.3 mg/dL (ref 0.0–1.2)
CO2: 23 mmol/L (ref 20–29)
Calcium: 9.6 mg/dL (ref 8.7–10.2)
Chloride: 103 mmol/L (ref 96–106)
Creatinine, Ser: 0.99 mg/dL (ref 0.76–1.27)
Globulin, Total: 2.3 g/dL (ref 1.5–4.5)
Glucose: 98 mg/dL (ref 70–99)
Potassium: 4.7 mmol/L (ref 3.5–5.2)
Sodium: 140 mmol/L (ref 134–144)
Total Protein: 7 g/dL (ref 6.0–8.5)
eGFR: 99 mL/min/{1.73_m2} (ref >59)

## 2021-03-23 LAB — TSH: TSH: 1.05 u[IU]/mL (ref 0.450–4.500)

## 2021-03-26 ENCOUNTER — Other Ambulatory Visit: Payer: BC Managed Care – PPO

## 2021-03-30 ENCOUNTER — Encounter: Payer: Self-pay | Admitting: Internal Medicine

## 2021-03-30 ENCOUNTER — Ambulatory Visit: Payer: BC Managed Care – PPO | Admitting: Internal Medicine

## 2021-03-30 ENCOUNTER — Other Ambulatory Visit: Payer: Self-pay

## 2021-03-30 DIAGNOSIS — F339 Major depressive disorder, recurrent, unspecified: Secondary | ICD-10-CM | POA: Diagnosis not present

## 2021-03-30 DIAGNOSIS — Z202 Contact with and (suspected) exposure to infections with a predominantly sexual mode of transmission: Secondary | ICD-10-CM | POA: Diagnosis not present

## 2021-03-30 DIAGNOSIS — W57XXXA Bitten or stung by nonvenomous insect and other nonvenomous arthropods, initial encounter: Secondary | ICD-10-CM

## 2021-03-30 DIAGNOSIS — Z23 Encounter for immunization: Secondary | ICD-10-CM

## 2021-03-30 DIAGNOSIS — F419 Anxiety disorder, unspecified: Secondary | ICD-10-CM

## 2021-03-30 MED ORDER — NICOTINE 21 MG/24HR TD PT24: 21.0000 mg | MEDICATED_PATCH | Freq: Every day | TRANSDERMAL | 0 refills | Status: DC

## 2021-03-30 MED ORDER — CITALOPRAM HYDROBROMIDE 20 MG PO TABS: 20.0000 mg | ORAL_TABLET | Freq: Every day | ORAL | 2 refills | Status: DC

## 2021-03-30 MED ORDER — HYDROXYZINE HCL 25 MG PO TABS: 25.0000 mg | ORAL_TABLET | Freq: Three times a day (TID) | ORAL | 2 refills | Status: DC | PRN

## 2021-03-30 NOTE — Progress Notes (Signed)
BP 137/79   Pulse 68   Temp 98.1 F (36.7 C) (Oral)   Ht 5' 5.35" (1.66 m)   Wt 138 lb 9.6 oz (62.9 kg)   SpO2 97%   BMI 22.82 kg/m    Subjective:    Patient ID: Rodney Myers, male    DOB: 11/30/1980, 40 y.o.   MRN: [id number]  Chief Complaint  Patient presents with   Anxiety   Depression    HPI: Rodney Myers is a 40 y.o. male  Anxiety Presents for follow-up (is on celexa 10 mg now depression isnt as strong , a wqeek or so ago had a breakdown) visit. Symptoms include nervous/anxious behavior. Patient reports no chest pain, irritability, malaise, muscle tension or shortness of breath.    Depression        This is a chronic problem.  Past medical history includes anxiety.   Rash This is a new (got better had tinea veriscolor) problem. The affected locations include the back and torso. The rash is characterized by dryness and scaling. Pertinent negatives include no fever, joint pain, nail changes, rhinorrhea, shortness of breath or sore throat.   Chief Complaint  Patient presents with   Anxiety   Depression    Relevant past medical, surgical, family and social history reviewed and updated as indicated. Interim medical history since our last visit reviewed. Allergies and medications reviewed and updated.  Review of Systems  Constitutional:  Negative for fever and irritability.  HENT:  Negative for rhinorrhea and sore throat.   Respiratory:  Negative for shortness of breath.   Cardiovascular:  Negative for chest pain.  Musculoskeletal:  Negative for joint pain.  Skin:  Positive for rash. Negative for nail changes.  Psychiatric/Behavioral:  Positive for depression. The patient is nervous/anxious.    Per HPI unless specifically indicated above     Objective:    BP 137/79   Pulse 68   Temp 98.1 F (36.7 C) (Oral)   Ht 5' 5.35" (1.66 m)   Wt 138 lb 9.6 oz (62.9 kg)   SpO2 97%   BMI 22.82 kg/m   Wt Readings from Last 3 Encounters:  03/30/21 138 lb 9.6  oz (62.9 kg)  03/09/21 136 lb (61.7 kg)  09/28/20 127 lb 3.2 oz (57.7 kg)    Physical Exam  Results for orders placed or performed in visit on 03/22/21  TSH  Result Value Ref Range   TSH 1.050 0.450 - 4.500 uIU/mL  Comp Met (CMET)  Result Value Ref Range   Glucose 98 70 - 99 mg/dL   BUN 11 6 - 20 mg/dL   Creatinine, Ser 0.99 0.76 - 1.27 mg/dL   eGFR 99 >59 mL/min/1.73   BUN/Creatinine Ratio 11 9 - 20   Sodium 140 134 - 144 mmol/L   Potassium 4.7 3.5 - 5.2 mmol/L   Chloride 103 96 - 106 mmol/L   CO2 23 20 - 29 mmol/L   Calcium 9.6 8.7 - 10.2 mg/dL   Total Protein 7.0 6.0 - 8.5 g/dL   Albumin 4.7 4.0 - 5.0 g/dL   Globulin, Total 2.3 1.5 - 4.5 g/dL   Albumin/Globulin Ratio 2.0 1.2 - 2.2   Bilirubin Total 0.3 0.0 - 1.2 mg/dL   Alkaline Phosphatase 88 44 - 121 IU/L   AST 15 0 - 40 IU/L   ALT 13 0 - 44 IU/L        Current Outpatient Medications:    citalopram (CELEXA) 10 MG tablet, Take  1 tablet (10 mg total) by mouth daily., Disp: 30 tablet, Rfl: 1   fluconazole (DIFLUCAN) 150 MG tablet, Take 2 tablets (300 mg total) by mouth once a week., Disp: 4 tablet, Rfl: 0   hydrOXYzine (ATARAX/VISTARIL) 25 MG tablet, Take 1 tablet (25 mg total) by mouth 3 (three) times daily as needed., Disp: 90 tablet, Rfl: 1   tadalafil (CIALIS) 20 MG tablet, Take 0.5-1 tablets (10-20 mg total) by mouth every other day as needed for erectile dysfunction., Disp: 5 tablet, Rfl: 11    Assessment & Plan:  Anxiety  / depression  is on celexa 10 mg  Increase to 20 mg for such  Sees a therapist.  Is in a relationship now, helps per pt.   Tinea versicolor resolved per pt.  S/p antifungals.   Smoking cessation :  Smoking cessation advised. Has nicotine patches in the past. continues to smoke. more than > 5 - 10 mins of time was spent with pt regarding smoking cessation and complications    Tick bite stable, RMSF +ve IgG    Problem List Items Addressed This Visit   None   No orders of the defined  types were placed in this encounter.    Meds ordered this encounter  Medications   citalopram (CELEXA) 20 MG tablet    Sig: Take 1 tablet (20 mg total) by mouth daily.    Dispense:  30 tablet    Refill:  2   hydrOXYzine (ATARAX/VISTARIL) 25 MG tablet    Sig: Take 1 tablet (25 mg total) by mouth 3 (three) times daily as needed.    Dispense:  30 tablet    Refill:  2   nicotine (NICODERM CQ) 21 mg/24hr patch    Sig: Place 1 patch (21 mg total) onto the skin daily.    Dispense:  28 patch    Refill:  0      Follow up plan: No follow-ups on file.

## 2021-04-12 DIAGNOSIS — F411 Generalized anxiety disorder: Secondary | ICD-10-CM | POA: Diagnosis not present

## 2021-04-20 DIAGNOSIS — F411 Generalized anxiety disorder: Secondary | ICD-10-CM | POA: Diagnosis not present

## 2021-05-04 DIAGNOSIS — F411 Generalized anxiety disorder: Secondary | ICD-10-CM | POA: Diagnosis not present

## 2021-05-20 DIAGNOSIS — F411 Generalized anxiety disorder: Secondary | ICD-10-CM | POA: Diagnosis not present

## 2021-06-01 DIAGNOSIS — F411 Generalized anxiety disorder: Secondary | ICD-10-CM | POA: Diagnosis not present

## 2021-06-14 DIAGNOSIS — F411 Generalized anxiety disorder: Secondary | ICD-10-CM | POA: Diagnosis not present

## 2021-06-28 DIAGNOSIS — F411 Generalized anxiety disorder: Secondary | ICD-10-CM | POA: Diagnosis not present

## 2021-06-30 ENCOUNTER — Other Ambulatory Visit: Payer: Self-pay

## 2021-06-30 ENCOUNTER — Ambulatory Visit: Payer: BC Managed Care – PPO | Admitting: Internal Medicine

## 2021-06-30 ENCOUNTER — Encounter: Payer: Self-pay | Admitting: Internal Medicine

## 2021-06-30 VITALS — BP 112/71 | HR 69 | Temp 97.0°F | Ht 65.35 in | Wt 146.2 lb

## 2021-06-30 DIAGNOSIS — N529 Male erectile dysfunction, unspecified: Secondary | ICD-10-CM

## 2021-06-30 DIAGNOSIS — F339 Major depressive disorder, recurrent, unspecified: Secondary | ICD-10-CM | POA: Diagnosis not present

## 2021-06-30 DIAGNOSIS — F419 Anxiety disorder, unspecified: Secondary | ICD-10-CM | POA: Diagnosis not present

## 2021-06-30 MED ORDER — NICOTINE 21 MG/24HR TD PT24
21.0000 mg | MEDICATED_PATCH | Freq: Every day | TRANSDERMAL | 0 refills | Status: DC
Start: 2021-06-30 — End: 2021-09-14

## 2021-06-30 NOTE — Progress Notes (Signed)
BP 112/71    Pulse 69    Temp (!) 97 F (36.1 C) (Oral)    Ht 5' 5.35" (1.66 m)    Wt 146 lb 3.2 oz (66.3 kg)    SpO2 97%    BMI 24.07 kg/m    Subjective:    Patient ID: Rodney Myers, male    DOB: Jan 01, 1981, 41 y.o.   MRN: [id number]  Chief Complaint  Patient presents with   Anxiety   Depression    HPI: Rodney Myers is a 41 y.o. male  Anxiety Presents for follow-up visit. Patient reports no chest pain, confusion, decreased concentration, dizziness, nausea, palpitations, shortness of breath or suicidal ideas.    Depression        This is a chronic problem.  The current episode started more than 1 year ago. The problem is unchanged.  Associated symptoms include no decreased concentration, no fatigue, no appetite change, no myalgias, no headaches and no suicidal ideas.  Past medical history includes anxiety.    Chief Complaint  Patient presents with   Anxiety   Depression    Relevant past medical, surgical, family and social history reviewed and updated as indicated. Interim medical history since our last visit reviewed. Allergies and medications reviewed and updated.  Review of Systems  Constitutional:  Negative for activity change, appetite change, chills, fatigue and fever.  HENT:  Negative for congestion, ear discharge, ear pain and facial swelling.   Eyes:  Negative for pain, discharge and itching.  Respiratory:  Negative for cough, chest tightness, shortness of breath and wheezing.   Cardiovascular:  Negative for chest pain, palpitations and leg swelling.  Gastrointestinal:  Negative for abdominal distention, abdominal pain, blood in stool, constipation, diarrhea, nausea and vomiting.  Endocrine: Negative for cold intolerance, heat intolerance, polydipsia, polyphagia and polyuria.  Genitourinary:  Negative for difficulty urinating, dysuria, flank pain, frequency, hematuria and urgency.  Musculoskeletal:  Negative for arthralgias, gait problem, joint  swelling and myalgias.  Skin:  Negative for color change, rash and wound.  Neurological:  Negative for dizziness, tremors, speech difficulty, weakness, light-headedness, numbness and headaches.  Hematological:  Does not bruise/bleed easily.  Psychiatric/Behavioral:  Positive for depression. Negative for agitation, confusion, decreased concentration, sleep disturbance and suicidal ideas.    Per HPI unless specifically indicated above     Objective:    BP 112/71    Pulse 69    Temp (!) 97 F (36.1 C) (Oral)    Ht 5' 5.35" (1.66 m)    Wt 146 lb 3.2 oz (66.3 kg)    SpO2 97%    BMI 24.07 kg/m   Wt Readings from Last 3 Encounters:  06/30/21 146 lb 3.2 oz (66.3 kg)  03/30/21 138 lb 9.6 oz (62.9 kg)  03/09/21 136 lb (61.7 kg)    Physical Exam Vitals and nursing note reviewed.  Constitutional:      General: He is not in acute distress.    Appearance: Normal appearance. He is not ill-appearing or diaphoretic.  HENT:     Head: Normocephalic and atraumatic.     Right Ear: Tympanic membrane and external ear normal. There is no impacted cerumen.     Left Ear: External ear normal.     Nose: No congestion or rhinorrhea.     Mouth/Throat:     Pharynx: No oropharyngeal exudate or posterior oropharyngeal erythema.  Eyes:     Conjunctiva/sclera: Conjunctivae normal.     Pupils: Pupils are equal, round, and reactive  to light.  Cardiovascular:     Rate and Rhythm: Normal rate and regular rhythm.     Heart sounds: No murmur heard.   No friction rub. No gallop.  Pulmonary:     Effort: No respiratory distress.     Breath sounds: No stridor. No wheezing or rhonchi.  Chest:     Chest wall: No tenderness.  Abdominal:     General: Abdomen is flat. Bowel sounds are normal.     Palpations: Abdomen is soft. There is no mass.     Tenderness: There is no abdominal tenderness.  Musculoskeletal:     Cervical back: Normal range of motion and neck supple. No rigidity or tenderness.     Left lower leg: No  edema.  Skin:    General: Skin is warm and dry.  Neurological:     Mental Status: He is alert.    Results for orders placed or performed in visit on 03/22/21  TSH  Result Value Ref Range   TSH 1.050 0.450 - 4.500 uIU/mL  Comp Met (CMET)  Result Value Ref Range   Glucose 98 70 - 99 mg/dL   BUN 11 6 - 20 mg/dL   Creatinine, Ser 0.99 0.76 - 1.27 mg/dL   eGFR 99 >59 mL/min/1.73   BUN/Creatinine Ratio 11 9 - 20   Sodium 140 134 - 144 mmol/L   Potassium 4.7 3.5 - 5.2 mmol/L   Chloride 103 96 - 106 mmol/L   CO2 23 20 - 29 mmol/L   Calcium 9.6 8.7 - 10.2 mg/dL   Total Protein 7.0 6.0 - 8.5 g/dL   Albumin 4.7 4.0 - 5.0 g/dL   Globulin, Total 2.3 1.5 - 4.5 g/dL   Albumin/Globulin Ratio 2.0 1.2 - 2.2   Bilirubin Total 0.3 0.0 - 1.2 mg/dL   Alkaline Phosphatase 88 44 - 121 IU/L   AST 15 0 - 40 IU/L   ALT 13 0 - 44 IU/L        Current Outpatient Medications:    tadalafil (CIALIS) 20 MG tablet, Take 0.5-1 tablets (10-20 mg total) by mouth every other day as needed for erectile dysfunction., Disp: 5 tablet, Rfl: 11   nicotine (NICODERM CQ) 21 mg/24hr patch, Place 1 patch (21 mg total) onto the skin daily., Disp: 28 patch, Rfl: 0    Assessment & Plan:  Anxiety / depression : taper celexa to off start 10 mg every day x 1 week  Then 10 mg every day for 1 week then stop.  Seeing therapist and helps some.  ED - will refer pt to urology for such   Problem List Items Addressed This Visit       Other   Anxiety   Depression, recurrent ([place])   Erectile dysfunction - Primary   Relevant Orders   Ambulatory referral to Urology     Orders Placed This Encounter  Procedures   Ambulatory referral to Urology     Meds ordered this encounter  Medications   nicotine (NICODERM CQ) 21 mg/24hr patch    Sig: Place 1 patch (21 mg total) onto the skin daily.    Dispense:  28 patch    Refill:  0     Follow up plan: Return in about 1 year (around 06/30/2022).

## 2021-06-30 NOTE — Patient Instructions (Addendum)
Taper celexa to off start 10 mg every day x 1 week  Then 10 mg every day for 1 week then stop.

## 2021-07-13 DIAGNOSIS — F411 Generalized anxiety disorder: Secondary | ICD-10-CM | POA: Diagnosis not present

## 2021-07-20 DIAGNOSIS — F411 Generalized anxiety disorder: Secondary | ICD-10-CM | POA: Diagnosis not present

## 2021-07-27 ENCOUNTER — Ambulatory Visit: Payer: BC Managed Care – PPO | Admitting: Urology

## 2021-07-27 DIAGNOSIS — F411 Generalized anxiety disorder: Secondary | ICD-10-CM | POA: Diagnosis not present

## 2021-07-28 ENCOUNTER — Other Ambulatory Visit: Payer: Self-pay | Admitting: Internal Medicine

## 2021-07-28 ENCOUNTER — Ambulatory Visit: Payer: Self-pay | Admitting: *Deleted

## 2021-07-28 DIAGNOSIS — Z202 Contact with and (suspected) exposure to infections with a predominantly sexual mode of transmission: Secondary | ICD-10-CM

## 2021-07-28 DIAGNOSIS — W57XXXA Bitten or stung by nonvenomous insect and other nonvenomous arthropods, initial encounter: Secondary | ICD-10-CM

## 2021-07-28 DIAGNOSIS — F419 Anxiety disorder, unspecified: Secondary | ICD-10-CM

## 2021-07-28 DIAGNOSIS — F339 Major depressive disorder, recurrent, unspecified: Secondary | ICD-10-CM

## 2021-07-28 DIAGNOSIS — Z23 Encounter for immunization: Secondary | ICD-10-CM

## 2021-07-28 NOTE — Telephone Encounter (Signed)
Will leave for PCP review upon return.

## 2021-07-28 NOTE — Telephone Encounter (Signed)
Discontinued 06/30/21. Not on current med profile  . Requested Prescriptions  Pending Prescriptions Disp Refills   citalopram (CELEXA) 20 MG tablet [Pharmacy Med Name: CITALOPRAM HBR 20 MG TABLET] 30 tablet 0    Sig: Take 1 tablet (20 mg total) by mouth daily.     Psychiatry:  Antidepressants - SSRI Passed - 07/28/2021  9:02 AM      Passed - Completed PHQ-2 or PHQ-9 in the last 360 days      Passed - Valid encounter within last 6 months    Recent Outpatient Visits          4 weeks ago Erectile dysfunction, unspecified erectile dysfunction type   [facility] Vigg, Avanti, MD   4 months ago Need for influenza vaccination   [facility] Vigg, Avanti, MD   4 months ago Need for influenza vaccination   [facility] Vigg, Avanti, MD   10 months ago Depression, recurrent [facility])   [facility] Johnson, Megan P, DO   11 months ago Depression, recurrent ([facility])   [facility] Johnson, Megan P, DO      Future Appointments            In 6 days Vigg, Avanti, MD [facility]   In 11 months Vigg, Avanti, MD [facility]

## 2021-07-28 NOTE — Telephone Encounter (Signed)
Routing to provider to advise.  

## 2021-07-28 NOTE — Telephone Encounter (Signed)
°  Chief Complaint: requesting to restart celexa  Symptoms: increased irritability , depression  Frequency: x 2 weeks  Pertinent Negatives: Patient denies feeling of suicide or hurting others.  Disposition: [] ED /[] Urgent Care (no appt availability in office) / [x] Appointment(In office/virtual)/ []  [place] [facility]/ [] Home Care/ [] Refused Recommended Disposition /[] [place] [facility]/ []  Follow-up with PCP Additional Notes:   Requesting to restart celexa at 10 mg if PCP feels to start low dose. Appt scheduled 08/03/21. Requested in afternoon. Patient requesting call back if medication can be restarted.    Reason for Disposition  [1] New or changed psychiatric medications > 2 weeks ago AND [2] not feeling any better  Answer Assessment - Initial Assessment Questions 1. CONCERN: "What happened that made you call today?"     Requesting to start back on celexa feeling irritable and depressed 2. DEPRESSION SYMPTOM SCREENING: "How are you feeling overall?" (e.g., decreased energy, increased sleeping or difficulty sleeping, difficulty concentrating, feelings of sadness, guilt, hopelessness, or worthlessness)     Increased irritability noted and depression 3. RISK OF HARM - SUICIDAL IDEATION:  "Do you ever have thoughts of hurting or killing yourself?"  (e.g., yes, no, no but preoccupation with thoughts about death)   - INTENT:  "Do you have thoughts of hurting or killing yourself right NOW?" (e.g., yes, no, N/A)   - PLAN: "Do you have a specific plan for how you would do this?" (e.g., gun, knife, overdose, no plan, N/A)     no 4. RISK OF HARM - HOMICIDAL IDEATION:  "Do you ever have thoughts of hurting or killing someone else?"  (e.g., yes, no, no but preoccupation with thoughts about death)   - INTENT:  "Do you have thoughts of hurting or killing someone right NOW?" (e.g., yes, no, N/A)   - PLAN: "Do you have a specific plan for how you would do this?" (e.g., gun, knife, no plan, N/A)       Denies  5. FUNCTIONAL IMPAIRMENT: "How have things been going for you overall? Have you had more difficulty than usual doing your normal daily activities?"  (e.g., better, same, worse; self-care, school, work, interactions)     Difficulty to complete a full shift of work without symptoms of irritability and depression 6. SUPPORT: "Who is with you now?" "Who do you live with?" "Do you have family or friends who you can talk to?"      At work now , has family and friends , have 800 # to call and GF and coworkers  7. THERAPIST: "Do you have a counselor or therapist? Name?"     Yes , or [place]  8. STRESSORS: "Has there been any new stress or recent changes in your life?"     More irritability  9. ALCOHOL USE OR SUBSTANCE USE (DRUG USE): "Do you drink alcohol or use any illegal drugs?"     Na  10. OTHER: "Do you have any other physical symptoms right now?" (e.g., fever)       Na  11. PREGNANCY: "Is there any chance you are pregnant?" "When was your last menstrual period?"       na  Protocols used: Depression-A-AH

## 2021-08-02 NOTE — Telephone Encounter (Signed)
Pl call pt ok to restart at 10 mg q daily fu x 6 weeks. Pl send in more if needs refill thnx

## 2021-08-02 NOTE — Telephone Encounter (Signed)
Patient has appt tomorrow to speak with PCP

## 2021-08-02 NOTE — Telephone Encounter (Signed)
Called and left message for patient to return call.  

## 2021-08-03 ENCOUNTER — Ambulatory Visit: Payer: BC Managed Care – PPO | Admitting: Internal Medicine

## 2021-08-03 ENCOUNTER — Other Ambulatory Visit: Payer: Self-pay

## 2021-08-03 DIAGNOSIS — Z23 Encounter for immunization: Secondary | ICD-10-CM

## 2021-08-03 DIAGNOSIS — F419 Anxiety disorder, unspecified: Secondary | ICD-10-CM

## 2021-08-03 DIAGNOSIS — W57XXXA Bitten or stung by nonvenomous insect and other nonvenomous arthropods, initial encounter: Secondary | ICD-10-CM

## 2021-08-03 DIAGNOSIS — Z202 Contact with and (suspected) exposure to infections with a predominantly sexual mode of transmission: Secondary | ICD-10-CM

## 2021-08-03 DIAGNOSIS — F339 Major depressive disorder, recurrent, unspecified: Secondary | ICD-10-CM

## 2021-08-03 MED ORDER — CITALOPRAM HYDROBROMIDE 10 MG PO TABS: 10.0000 mg | ORAL_TABLET | Freq: Every day | ORAL | 1 refills | Status: DC

## 2021-08-10 DIAGNOSIS — F411 Generalized anxiety disorder: Secondary | ICD-10-CM | POA: Diagnosis not present

## 2021-08-24 DIAGNOSIS — F411 Generalized anxiety disorder: Secondary | ICD-10-CM | POA: Diagnosis not present

## 2021-09-07 DIAGNOSIS — F411 Generalized anxiety disorder: Secondary | ICD-10-CM | POA: Diagnosis not present

## 2021-09-14 ENCOUNTER — Telehealth (INDEPENDENT_AMBULATORY_CARE_PROVIDER_SITE_OTHER): Payer: BC Managed Care – PPO | Admitting: Internal Medicine

## 2021-09-14 ENCOUNTER — Encounter: Payer: Self-pay | Admitting: Internal Medicine

## 2021-09-14 DIAGNOSIS — F419 Anxiety disorder, unspecified: Secondary | ICD-10-CM

## 2021-09-14 DIAGNOSIS — W57XXXA Bitten or stung by nonvenomous insect and other nonvenomous arthropods, initial encounter: Secondary | ICD-10-CM | POA: Diagnosis not present

## 2021-09-14 DIAGNOSIS — Z202 Contact with and (suspected) exposure to infections with a predominantly sexual mode of transmission: Secondary | ICD-10-CM

## 2021-09-14 DIAGNOSIS — F339 Major depressive disorder, recurrent, unspecified: Secondary | ICD-10-CM | POA: Diagnosis not present

## 2021-09-14 DIAGNOSIS — Z23 Encounter for immunization: Secondary | ICD-10-CM

## 2021-09-14 MED ORDER — CITALOPRAM HYDROBROMIDE 10 MG PO TABS: 10.0000 mg | ORAL_TABLET | Freq: Every day | ORAL | 1 refills | Status: DC

## 2021-09-14 MED ORDER — OMEPRAZOLE 20 MG PO CPDR: 20.0000 mg | DELAYED_RELEASE_CAPSULE | Freq: Every day | ORAL | 2 refills | Status: DC

## 2021-09-14 MED ORDER — NICOTINE 21 MG/24HR TD PT24: 21.0000 mg | MEDICATED_PATCH | Freq: Every day | TRANSDERMAL | 0 refills | Status: DC

## 2021-09-14 NOTE — Progress Notes (Signed)
? ?There were no vitals taken for this visit.  ? ?Subjective:  ? ? Patient ID: Rodney Myers, male    DOB: 09/01/1980, 41 y.o.   MRN: [id number] ? ?Chief Complaint  ?Patient presents with  ?? Depression  ?  And anxiety doing much better since starting Celexa. Is worried about side effects, has been having some heartburn lately, thinks may be related.  ?  ?? Fall  ?  Fell on Saturday, hurt left shoulder  ? ? ?HPI: ?Rodney Myers is a 41 y.o. male ? ?Pain on the bil lower part of the chest worse when he lifts heavy things at work. If he rests it releieves. ? ?Depression ?       This is a chronic (good spirits now after getting restarted on celexa. was trying to get off of celexa as he was feeling off) problem. ?Gastroesophageal Reflux ?He complains of heartburn. He reports no abdominal pain or no early satiety.  ? ?Chief Complaint  ?Patient presents with  ?? Depression  ?  And anxiety doing much better since starting Celexa. Is worried about side effects, has been having some heartburn lately, thinks may be related.  ?  ?? Fall  ?  Fell on Saturday, hurt left shoulder  ? ? ?Relevant past medical, surgical, family and social history reviewed and updated as indicated. Interim medical history since our last visit reviewed. ?Allergies and medications reviewed and updated. ? ?Review of Systems  ?Gastrointestinal:  Positive for heartburn. Negative for abdominal pain.  ?Psychiatric/Behavioral:  Positive for depression.   ? ?Per HPI unless specifically indicated above ? ?   ?Objective:  ?  ?There were no vitals taken for this visit.  ?Wt Readings from Last 3 Encounters:  ?06/30/21 146 lb 3.2 oz (66.3 kg)  ?03/30/21 138 lb 9.6 oz (62.9 kg)  ?03/09/21 136 lb (61.7 kg)  ?  ?Physical Exam ? ?Results for orders placed or performed in visit on 03/22/21  ?TSH  ?Result Value Ref Range  ? TSH 1.050 0.450 - 4.500 uIU/mL  ?Comp Met (CMET)  ?Result Value Ref Range  ? Glucose 98 70 - 99 mg/dL  ? BUN 11 6 - 20 mg/dL  ? Creatinine, Ser  0.99 0.76 - 1.27 mg/dL  ? eGFR 99 >59 mL/min/1.73  ? BUN/Creatinine Ratio 11 9 - 20  ? Sodium 140 134 - 144 mmol/L  ? Potassium 4.7 3.5 - 5.2 mmol/L  ? Chloride 103 96 - 106 mmol/L  ? CO2 23 20 - 29 mmol/L  ? Calcium 9.6 8.7 - 10.2 mg/dL  ? Total Protein 7.0 6.0 - 8.5 g/dL  ? Albumin 4.7 4.0 - 5.0 g/dL  ? Globulin, Total 2.3 1.5 - 4.5 g/dL  ? Albumin/Globulin Ratio 2.0 1.2 - 2.2  ? Bilirubin Total 0.3 0.0 - 1.2 mg/dL  ? Alkaline Phosphatase 88 44 - 121 IU/L  ? AST 15 0 - 40 IU/L  ? ALT 13 0 - 44 IU/L  ? ?   ? ? ?Current Outpatient Medications:  ??  nicotine (NICODERM CQ) 21 mg/24hr patch, Place 1 patch (21 mg total) onto the skin daily., Disp: 28 patch, Rfl: 0 ??  omeprazole (PRILOSEC) 20 MG capsule, Take 1 capsule (20 mg total) by mouth daily., Disp: 30 capsule, Rfl: 2 ??  citalopram (CELEXA) 10 MG tablet, Take 1 tablet (10 mg total) by mouth daily., Disp: 90 tablet, Rfl: 1  ? ? ?Assessment & Plan:  ?Depression and anxiety  ?Is on celexa 10  mg daily ?Has increased smoking sec to such  ?Still sees therapist  ?Stopped smoking marijuana - seemed to help him some.  ? ? ?GERD  ?Start pt on omeprazole 20 mg daily.  ?patient advised to avoid laying down soon after his meals. He took a 2 hours between dinner and bedtime. Avoid spicy food and triggers that he knows food wise that worsen his acid reflux. Patient verbalized understanding of the above. Lifestyle modifications as above discussed with patient. ? ? ?Problem List Items Addressed This Visit   ? ?  ? Musculoskeletal and Integument  ? Tick bite  ? Relevant Medications  ? citalopram (CELEXA) 10 MG tablet  ?  ? Other  ? Anxiety  ? Relevant Medications  ? citalopram (CELEXA) 10 MG tablet  ? Depression, recurrent ([place])  ? Relevant Medications  ? citalopram (CELEXA) 10 MG tablet  ? Other Relevant Orders  ? CBC with Differential/Platelet  ? Comprehensive metabolic panel  ? Urinalysis, Routine w reflex microscopic  ? PSA  ? TSH  ? STD exposure  ? Relevant Medications  ?  citalopram (CELEXA) 10 MG tablet  ? Need for influenza vaccination  ? Relevant Medications  ? citalopram (CELEXA) 10 MG tablet  ?  ? ?Orders Placed This Encounter  ?Procedures  ?? CBC with Differential/Platelet  ?? Comprehensive metabolic panel  ?? Urinalysis, Routine w reflex microscopic  ?? PSA  ?? TSH  ?  ? ?Meds ordered this encounter  ?Medications  ?? citalopram (CELEXA) 10 MG tablet  ?  Sig: Take 1 tablet (10 mg total) by mouth daily.  ?  Dispense:  90 tablet  ?  Refill:  1  ?? omeprazole (PRILOSEC) 20 MG capsule  ?  Sig: Take 1 capsule (20 mg total) by mouth daily.  ?  Dispense:  30 capsule  ?  Refill:  2  ?? nicotine (NICODERM CQ) 21 mg/24hr patch  ?  Sig: Place 1 patch (21 mg total) onto the skin daily.  ?  Dispense:  28 patch  ?  Refill:  0  ?  ? ?Follow up plan: ?No follow-ups on file. ? ? ?

## 2021-09-28 DIAGNOSIS — F411 Generalized anxiety disorder: Secondary | ICD-10-CM | POA: Diagnosis not present

## 2021-09-29 ENCOUNTER — Encounter: Payer: Self-pay | Admitting: Internal Medicine

## 2021-10-05 ENCOUNTER — Ambulatory Visit
Admission: RE | Admit: 2021-10-05 | Discharge: 2021-10-05 | Disposition: A | Discharge status: Home / Self Care | Payer: BC Managed Care – PPO | Attending: Internal Medicine | Admitting: Internal Medicine

## 2021-10-05 ENCOUNTER — Ambulatory Visit: Payer: Self-pay | Admitting: *Deleted

## 2021-10-05 ENCOUNTER — Encounter: Payer: Self-pay | Admitting: Internal Medicine

## 2021-10-05 ENCOUNTER — Ambulatory Visit: Payer: BC Managed Care – PPO | Admitting: Internal Medicine

## 2021-10-05 ENCOUNTER — Ambulatory Visit
Admission: RE | Admit: 2021-10-05 | Discharge: 2021-10-05 | Disposition: A | Discharge status: Home / Self Care | Payer: BC Managed Care – PPO | Source: Ambulatory Visit | Attending: Internal Medicine | Admitting: Internal Medicine

## 2021-10-05 VITALS — BP 135/89 | HR 70 | Temp 98.0°F | Ht 65.35 in | Wt 163.8 lb

## 2021-10-05 DIAGNOSIS — F411 Generalized anxiety disorder: Secondary | ICD-10-CM | POA: Diagnosis not present

## 2021-10-05 DIAGNOSIS — R079 Chest pain, unspecified: Secondary | ICD-10-CM | POA: Insufficient documentation

## 2021-10-05 DIAGNOSIS — W19XXXA Unspecified fall, initial encounter: Secondary | ICD-10-CM | POA: Insufficient documentation

## 2021-10-05 DIAGNOSIS — R1013 Epigastric pain: Secondary | ICD-10-CM | POA: Diagnosis not present

## 2021-10-05 DIAGNOSIS — Z23 Encounter for immunization: Secondary | ICD-10-CM

## 2021-10-05 DIAGNOSIS — Z202 Contact with and (suspected) exposure to infections with a predominantly sexual mode of transmission: Secondary | ICD-10-CM | POA: Diagnosis not present

## 2021-10-05 DIAGNOSIS — R0781 Pleurodynia: Secondary | ICD-10-CM | POA: Diagnosis not present

## 2021-10-05 DIAGNOSIS — W57XXXA Bitten or stung by nonvenomous insect and other nonvenomous arthropods, initial encounter: Secondary | ICD-10-CM

## 2021-10-05 DIAGNOSIS — F419 Anxiety disorder, unspecified: Secondary | ICD-10-CM

## 2021-10-05 DIAGNOSIS — F339 Major depressive disorder, recurrent, unspecified: Secondary | ICD-10-CM

## 2021-10-05 MED ORDER — OMEPRAZOLE 40 MG PO CPDR: 40.0000 mg | DELAYED_RELEASE_CAPSULE | Freq: Every day | ORAL | 2 refills | Status: DC

## 2021-10-05 NOTE — Telephone Encounter (Signed)
?  Chief Complaint: Chest "Muscle" Pain ?Symptoms: 4-5/10 intermittent pain "At breastbone" S/P fall 1 month ago, "Flare up 2-3 weeks ago." At times worse "After eating" ?Frequency: 2-3 weeks ?Pertinent Negatives: does not radiate ?Disposition: [] ED /[] Urgent Care (no appt availability in office) / [x] Appointment(In office/virtual)/ []  [place] [facility]/ [] Home Care/ [] Refused Recommended Disposition /[]  Mobile Bus/ []  Follow-up with PCP ?Additional Notes: Appt secured for today with Dr. Neomia Dear. Had VV 09/14/21, XRay recommended, did not have done as of yet. ?Reason for Disposition ? [1] Chest wall swelling or pain AND [2] present > 7 days ? ?Answer Assessment - Initial Assessment Questions ?1. MECHANISM: "How did the injury happen?" ?    Fell off chair ?2. ONSET:When did the injury happen?" (Minutes or hours ago) ?   1 month ago ?3. LOCATION: "Where on the chest is the injury located?" ?    "Breastbone" ?4. APPEARANCE: "What does the injury look like?" ?     ?5. BLEEDING: "Is there any bleeding now? If Yes, ask: How long has it been bleeding?" ?     ?6. SEVERITY: "Any difficulty with breathing?" ?    No ?7. SIZE: For cuts, bruises, or swelling, ask: "How large is it?" (e.g., inches or centimeters) ?     ?8. PAIN: "Is there pain?" If Yes, ask: "How bad is the pain?"   (e.g., Scale 1-10; or mild, moderate, severe) ?    Intermittent 4-5/10.  After eating "Hurts more."  Does heavy lifting at work. ? ?Protocols used: Chest Injury-A-AH ? ?

## 2021-10-05 NOTE — Telephone Encounter (Signed)
Gotcha the triage notes say differently! Thnx much

## 2021-10-05 NOTE — Progress Notes (Signed)
? ?BP 135/89   Pulse 70   Temp 98 ?F (36.7 ?C) (Oral)   Ht 5' 5.35" (1.66 m)   Wt 163 lb 12.8 oz (74.3 kg)   SpO2 98%   BMI 26.96 kg/m?   ? ?Subjective:  ? ? Patient ID: Rodney Myers, male    DOB: Dec 20, 1980, 41 y.o.   MRN: [id number] ? ?Chief Complaint  ?Patient presents with  ? Rib Injury  ?  [place] about a month ago, most painful on sternum. Pain is relieved with pressure. Hurts mainly after eating. ?  ? ? ?HPI: ?Rodney Myers is a 41 y.o. male ? ?Was changing the light and fell off a chair, he fell on the floor on his chest.  ? ?Abdominal Pain ?This is a new problem. The current episode started 1 to 4 weeks ago. The onset quality is gradual. The pain is located in the epigastric region. The pain is at a severity of 4/10. The pain is mild. The quality of the pain is sharp and colicky (worse with food and worse when he eats something greasy /). The abdominal pain radiates to the epigastric region. Pertinent negatives include no anorexia, arthralgias, belching, constipation, diarrhea, dysuria, fever, flatus, frequency, headaches, hematochezia, hematuria, melena, myalgias, nausea, vomiting or weight loss.  ? ?Chief Complaint  ?Patient presents with  ? Rib Injury  ?  [place] about a month ago, most painful on sternum. Pain is relieved with pressure. Hurts mainly after eating. ?  ? ? ?Relevant past medical, surgical, family and social history reviewed and updated as indicated. Interim medical history since our last visit reviewed. ?Allergies and medications reviewed and updated. ? ?Review of Systems  ?Constitutional:  Negative for fever and weight loss.  ?Gastrointestinal:  Positive for abdominal pain. Negative for anorexia, constipation, diarrhea, flatus, hematochezia, melena, nausea and vomiting.  ?Genitourinary:  Negative for dysuria, frequency and hematuria.  ?Musculoskeletal:  Negative for arthralgias and myalgias.  ?Neurological:  Negative for headaches.  ? ?Per HPI unless specifically indicated above ? ?    ?Objective:  ?  ?BP 135/89   Pulse 70   Temp 98 ?F (36.7 ?C) (Oral)   Ht 5' 5.35" (1.66 m)   Wt 163 lb 12.8 oz (74.3 kg)   SpO2 98%   BMI 26.96 kg/m?   ?Wt Readings from Last 3 Encounters:  ?10/05/21 163 lb 12.8 oz (74.3 kg)  ?06/30/21 146 lb 3.2 oz (66.3 kg)  ?03/30/21 138 lb 9.6 oz (62.9 kg)  ?  ?Physical Exam ?Vitals and nursing note reviewed.  ?Constitutional:   ?   General: He is not in acute distress. ?   Appearance: Normal appearance. He is not ill-appearing or diaphoretic.  ?HENT:  ?   Mouth/Throat:  ?   Pharynx: No oropharyngeal exudate.  ?Eyes:  ?   Conjunctiva/sclera: Conjunctivae normal.  ?   Pupils: Pupils are equal, round, and reactive to light.  ?Cardiovascular:  ?   Rate and Rhythm: Normal rate and regular rhythm.  ?   Heart sounds: No murmur heard. ?  No friction rub. No gallop.  ?Pulmonary:  ?   Effort: No respiratory distress.  ?   Breath sounds: No wheezing.  ?Abdominal:  ?   General: Abdomen is flat. Bowel sounds are normal.  ?   Palpations: Abdomen is soft. There is no mass.  ?   Tenderness: There is no abdominal tenderness.  ?Musculoskeletal:  ?   Cervical back: Normal range of motion and neck supple. No  rigidity or tenderness.  ?   Left lower leg: No edema.  ?Skin: ?   General: Skin is warm and dry.  ?Neurological:  ?   Mental Status: He is alert.  ? ? ?Results for orders placed or performed in visit on 03/22/21  ?TSH  ?Result Value Ref Range  ? TSH 1.050 0.450 - 4.500 uIU/mL  ?Comp Met (CMET)  ?Result Value Ref Range  ? Glucose 98 70 - 99 mg/dL  ? BUN 11 6 - 20 mg/dL  ? Creatinine, Ser 0.99 0.76 - 1.27 mg/dL  ? eGFR 99 >59 mL/min/1.73  ? BUN/Creatinine Ratio 11 9 - 20  ? Sodium 140 134 - 144 mmol/L  ? Potassium 4.7 3.5 - 5.2 mmol/L  ? Chloride 103 96 - 106 mmol/L  ? CO2 23 20 - 29 mmol/L  ? Calcium 9.6 8.7 - 10.2 mg/dL  ? Total Protein 7.0 6.0 - 8.5 g/dL  ? Albumin 4.7 4.0 - 5.0 g/dL  ? Globulin, Total 2.3 1.5 - 4.5 g/dL  ? Albumin/Globulin Ratio 2.0 1.2 - 2.2  ? Bilirubin Total 0.3  0.0 - 1.2 mg/dL  ? Alkaline Phosphatase 88 44 - 121 IU/L  ? AST 15 0 - 40 IU/L  ? ALT 13 0 - 44 IU/L  ? ?   ? ? ?Current Outpatient Medications:  ?  citalopram (CELEXA) 10 MG tablet, Take 1 tablet (10 mg total) by mouth daily., Disp: 90 tablet, Rfl: 1 ?  omeprazole (PRILOSEC) 20 MG capsule, Take 1 capsule (20 mg total) by mouth daily., Disp: 30 capsule, Rfl: 2 ?  nicotine (NICODERM CQ) 21 mg/24hr patch, Place 1 patch (21 mg total) onto the skin daily. (Patient not taking: Reported on 10/05/2021), Disp: 28 patch, Rfl: 0  ? ? ?Assessment & Plan:  ?Epigatric / RUQ pain  ?Will check [facility] ? Has a ho Nephrolithiasis ?GERD  ?Is on prilosec 20 mg daily.  ?Has been using red bull 2 x 8 oz on a bad day. ?No caffeinated drinks , stop red bull  ?Increase prilosec to 40 mg q hs ?Fu with me x 2 weeks for the above ? Consider GI referral if pt continues to have symptoms.  ? ?Chest pain s/ p fall will check xrays ribs today ? ?Problem List Items Addressed This Visit   ? ?  ? Musculoskeletal and Integument  ? Tick bite  ?  ? Other  ? Anxiety  ? Depression, recurrent ([place])  ? STD exposure  ? Need for influenza vaccination  ?  ? ?No orders of the defined types were placed in this encounter. ?  ? ?No orders of the defined types were placed in this encounter. ?  ? ?Follow up plan: ?No follow-ups on file. ?

## 2021-10-05 NOTE — Telephone Encounter (Signed)
Needs to go to the ER please let pt know thnx.

## 2021-10-05 NOTE — Progress Notes (Signed)
Please let pt know this was normal.

## 2021-10-06 LAB — COMPREHENSIVE METABOLIC PANEL
ALT: 37 IU/L (ref 0–44)
AST: 30 IU/L (ref 0–40)
Albumin/Globulin Ratio: 2.3 — ABNORMAL HIGH (ref 1.2–2.2)
Albumin: 4.8 g/dL (ref 4.0–5.0)
Alkaline Phosphatase: 93 IU/L (ref 44–121)
BUN/Creatinine Ratio: 12 (ref 9–20)
BUN: 12 mg/dL (ref 6–24)
Bilirubin Total: 0.6 mg/dL (ref 0.0–1.2)
CO2: 21 mmol/L (ref 20–29)
Calcium: 9.5 mg/dL (ref 8.7–10.2)
Chloride: 105 mmol/L (ref 96–106)
Creatinine, Ser: 1.01 mg/dL (ref 0.76–1.27)
Globulin, Total: 2.1 g/dL (ref 1.5–4.5)
Glucose: 103 mg/dL — ABNORMAL HIGH (ref 70–99)
Potassium: 4 mmol/L (ref 3.5–5.2)
Sodium: 142 mmol/L (ref 134–144)
Total Protein: 6.9 g/dL (ref 6.0–8.5)
eGFR: 96 mL/min/{1.73_m2} (ref >59)

## 2021-10-06 LAB — CBC WITH DIFFERENTIAL/PLATELET
Basophils Absolute: 0.1 10*3/uL (ref 0.0–0.2)
Basos: 1 %
EOS (ABSOLUTE): 0.5 10*3/uL — ABNORMAL HIGH (ref 0.0–0.4)
Eos: 7 %
Hematocrit: 44.5 % (ref 37.5–51.0)
Hemoglobin: 15.8 g/dL (ref 13.0–17.7)
Immature Grans (Abs): 0 10*3/uL (ref 0.0–0.1)
Immature Granulocytes: 0 %
Lymphocytes Absolute: 2.3 10*3/uL (ref 0.7–3.1)
Lymphs: 34 %
MCH: 30 pg (ref 26.6–33.0)
MCHC: 35.5 g/dL (ref 31.5–35.7)
MCV: 84 fL (ref 79–97)
Monocytes Absolute: 0.7 10*3/uL (ref 0.1–0.9)
Monocytes: 11 %
Neutrophils Absolute: 3.2 10*3/uL (ref 1.4–7.0)
Neutrophils: 47 %
Platelets: 218 10*3/uL (ref 150–450)
RBC: 5.27 x10E6/uL (ref 4.14–5.80)
RDW: 12.3 % (ref 11.6–15.4)
WBC: 6.8 10*3/uL (ref 3.4–10.8)

## 2021-10-19 ENCOUNTER — Encounter: Payer: Self-pay | Admitting: Internal Medicine

## 2021-10-19 ENCOUNTER — Telehealth (INDEPENDENT_AMBULATORY_CARE_PROVIDER_SITE_OTHER): Payer: BC Managed Care – PPO | Admitting: Internal Medicine

## 2021-10-19 DIAGNOSIS — F339 Major depressive disorder, recurrent, unspecified: Secondary | ICD-10-CM

## 2021-10-19 DIAGNOSIS — R1013 Epigastric pain: Secondary | ICD-10-CM | POA: Diagnosis not present

## 2021-10-19 DIAGNOSIS — F411 Generalized anxiety disorder: Secondary | ICD-10-CM | POA: Diagnosis not present

## 2021-10-19 DIAGNOSIS — F419 Anxiety disorder, unspecified: Secondary | ICD-10-CM

## 2021-10-19 MED ORDER — CITALOPRAM HYDROBROMIDE 20 MG PO TABS: 20.0000 mg | ORAL_TABLET | Freq: Every day | ORAL | 1 refills | Status: DC

## 2021-10-19 MED ORDER — NICOTINE 14 MG/24HR TD PT24: 14.0000 mg | MEDICATED_PATCH | Freq: Every day | TRANSDERMAL | 0 refills | Status: DC

## 2021-10-19 MED ORDER — NICOTINE 7 MG/24HR TD PT24: 7.0000 mg | MEDICATED_PATCH | Freq: Every day | TRANSDERMAL | 2 refills | Status: DC

## 2021-10-19 NOTE — Progress Notes (Signed)
? ?There were no vitals taken for this visit.  ? ?Subjective:  ? ? Patient ID: Rodney Myers, male    DOB: 10-Apr-1981, 41 y.o.   MRN: [id number] ? ?No chief complaint on file. ? ? ?HPI: ?Rodney Myers is a 41 y.o. male ? ? ?This visit was completed via telephone due to the restrictions of the COVID-19 pandemic. All issues as above were discussed and addressed but no physical exam was performed. If it was felt that the patient should be evaluated in the office, they were directed there. The patient verbally consented to this visit. Patient was unable to complete an audio/visual visit due to Technical difficulties. Due to the catastrophic nature of the COVID-19 pandemic, this visit was done through audio contact only. ?Location of the patient: work ?Location of the provider: work ?Those involved with this call:  ?Provider: Charlynne Cousins, MD ?CMA: Frazier Butt, CMA ?Front Desk/Registration: [employer]  ?Time spent on call: 10 minutes on the phone discussing health concerns. 10 minutes total spent in review of patient's record and preparation of their chart. ? ? ? ?Gastroesophageal Reflux ?He complains of abdominal pain. He reports no belching, no chest pain, no choking, no coughing, no dysphagia, no early satiety, no globus sensation, no heartburn, no hoarse voice, no nausea, no sore throat, no stridor, no tooth decay, no water brash or no wheezing. This is a recurrent problem. The current episode started more than 1 month ago.  ?Anxiety ?Presents for follow-up visit. Symptoms include nervous/anxious behavior. Patient reports no chest pain, compulsions, confusion, decreased concentration, depressed mood, dizziness, dry mouth, excessive worry, feeling of choking, hyperventilation, impotence, insomnia, irritability, malaise, muscle tension, nausea, obsessions, palpitations, panic, restlessness or shortness of breath.  ? ? ? ?No chief complaint on file. ? ? ?Relevant past medical, surgical, family and social  history reviewed and updated as indicated. Interim medical history since our last visit reviewed. ?Allergies and medications reviewed and updated. ? ?Review of Systems  ?Constitutional:  Negative for irritability.  ?HENT:  Negative for hoarse voice and sore throat.   ?Respiratory:  Negative for cough, choking, shortness of breath and wheezing.   ?Cardiovascular:  Negative for chest pain and palpitations.  ?Gastrointestinal:  Positive for abdominal pain. Negative for dysphagia, heartburn and nausea.  ?Genitourinary:  Negative for impotence.  ?Neurological:  Negative for dizziness.  ?Psychiatric/Behavioral:  Negative for confusion and decreased concentration. The patient is nervous/anxious. The patient does not have insomnia.   ? ?Per HPI unless specifically indicated above ? ?   ?Objective:  ?  ?There were no vitals taken for this visit.  ?Wt Readings from Last 3 Encounters:  ?10/05/21 163 lb 12.8 oz (74.3 kg)  ?06/30/21 146 lb 3.2 oz (66.3 kg)  ?03/30/21 138 lb 9.6 oz (62.9 kg)  ?  ?Physical Exam ? ?Results for orders placed or performed in visit on 10/05/21  ?Comprehensive metabolic panel  ?Result Value Ref Range  ? Glucose 103 (H) 70 - 99 mg/dL  ? BUN 12 6 - 24 mg/dL  ? Creatinine, Ser 1.01 0.76 - 1.27 mg/dL  ? eGFR 96 >59 mL/min/1.73  ? BUN/Creatinine Ratio 12 9 - 20  ? Sodium 142 134 - 144 mmol/L  ? Potassium 4.0 3.5 - 5.2 mmol/L  ? Chloride 105 96 - 106 mmol/L  ? CO2 21 20 - 29 mmol/L  ? Calcium 9.5 8.7 - 10.2 mg/dL  ? Total Protein 6.9 6.0 - 8.5 g/dL  ? Albumin 4.8 4.0 - 5.0 g/dL  ?  Globulin, Total 2.1 1.5 - 4.5 g/dL  ? Albumin/Globulin Ratio 2.3 (H) 1.2 - 2.2  ? Bilirubin Total 0.6 0.0 - 1.2 mg/dL  ? Alkaline Phosphatase 93 44 - 121 IU/L  ? AST 30 0 - 40 IU/L  ? ALT 37 0 - 44 IU/L  ?CBC with Differential/Platelet  ?Result Value Ref Range  ? WBC 6.8 3.4 - 10.8 x10E3/uL  ? RBC 5.27 4.14 - 5.80 x10E6/uL  ? Hemoglobin 15.8 13.0 - 17.7 g/dL  ? Hematocrit 44.5 37.5 - 51.0 %  ? MCV 84 79 - 97 fL  ? MCH 30.0 26.6 -  33.0 pg  ? MCHC 35.5 31.5 - 35.7 g/dL  ? RDW 12.3 11.6 - 15.4 %  ? Platelets 218 150 - 450 x10E3/uL  ? Neutrophils 47 Not Estab. %  ? Lymphs 34 Not Estab. %  ? Monocytes 11 Not Estab. %  ? Eos 7 Not Estab. %  ? Basos 1 Not Estab. %  ? Neutrophils Absolute 3.2 1.4 - 7.0 x10E3/uL  ? Lymphocytes Absolute 2.3 0.7 - 3.1 x10E3/uL  ? Monocytes Absolute 0.7 0.1 - 0.9 x10E3/uL  ? EOS (ABSOLUTE) 0.5 (H) 0.0 - 0.4 x10E3/uL  ? Basophils Absolute 0.1 0.0 - 0.2 x10E3/uL  ? Immature Granulocytes 0 Not Estab. %  ? Immature Grans (Abs) 0.0 0.0 - 0.1 x10E3/uL  ? ?   ? ? ?Current Outpatient Medications:  ?  nicotine (NICODERM CQ) 14 mg/24hr patch, Place 1 patch (14 mg total) onto the skin daily., Disp: 28 patch, Rfl: 0 ?  nicotine (NICODERM CQ) 7 mg/24hr patch, Place 1 patch (7 mg total) onto the skin daily., Disp: 28 patch, Rfl: 2 ?  citalopram (CELEXA) 20 MG tablet, Take 1 tablet (20 mg total) by mouth daily., Disp: 30 tablet, Rfl: 1 ?  omeprazole (PRILOSEC) 40 MG capsule, Take 1 capsule (40 mg total) by mouth daily., Disp: 30 capsule, Rfl: 2  ? ? ?Assessment & Plan:  ?Epigastric pain is on prilosec 40 mg. Stopped smoking ?patient advised to avoid laying down soon after his meals. He took a 2 hours between dinner and bedtime. Avoid spicy food and triggers that he knows food wise that worsen his acid reflux. Patient verbalized understanding of the above. Lifestyle modifications as above discussed with patient. ? ?2. Depression / anxiety is on celexa 10 mg.  ?Increase dose to 20 mg. potential Side effects dw pt. to call office if develops any SE. will fu with me in 1 month for such. pt verbalised understanding. ? ?  3. Smoking cessation quit smoking ?Is on nicoderm  ?Problem List Items Addressed This Visit   ? ?  ? Other  ? Anxiety  ? Relevant Medications  ? citalopram (CELEXA) 20 MG tablet  ? Depression, recurrent ([place])  ? Relevant Medications  ? citalopram (CELEXA) 20 MG tablet  ? Epigastric pain - Primary  ? Relevant Orders  ?  Ambulatory referral to Gastroenterology  ?  ? ?Orders Placed This Encounter  ?Procedures  ? Ambulatory referral to Gastroenterology  ?  ? ?Meds ordered this encounter  ?Medications  ? citalopram (CELEXA) 20 MG tablet  ?  Sig: Take 1 tablet (20 mg total) by mouth daily.  ?  Dispense:  30 tablet  ?  Refill:  1  ? nicotine (NICODERM CQ) 14 mg/24hr patch  ?  Sig: Place 1 patch (14 mg total) onto the skin daily.  ?  Dispense:  28 patch  ?  Refill:  0  ?  nicotine (NICODERM CQ) 7 mg/24hr patch  ?  Sig: Place 1 patch (7 mg total) onto the skin daily.  ?  Dispense:  28 patch  ?  Refill:  2  ?  ? ?Follow up plan: ?Return in about 3 months (around 01/19/2022). ? ?

## 2021-10-31 NOTE — Progress Notes (Signed)
Gastroenterology Consultation  Referring Provider:     Loura Pardon, MD Primary Care Physician:  Loura Pardon, MD Primary Gastroenterologist:  Dr. Servando Snare     Reason for Consultation:     Epigastric pain        HPI:   Rodney Myers is a 41 y.o. y/o male referred for consultation & management of Epigastric pain by Dr. Loura Pardon, MD.  This patient comes to see me after seeing his primary care provider in April for epigastric pain after falling off of her chair or changing a light bulb.  The patient then followed up earlier this month reporting epigastric pain and was sent for a GI referral. The patient was noted to be on a PPI and was told to not eat before his laying down and other antireflux measures. The patient had a x-ray sent off that did not show any fractures of his ribs. 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  He states that if he applies pressure with his hand to the area that hurts which is usually in the right side of his sternum that the pain will go away.  There is no report of any unexplained weight loss and the patient states that he is been tobacco free for 2 weeks and stopped red bull soda at the recommendation of his primary care physician.  Past Medical History:  Diagnosis Date   Allergy    Anxiety    GERD (gastroesophageal reflux disease)    Hypertension    Kidney calculi     Past Surgical History:  Procedure Laterality Date   ROOT CANAL  07/2014    Prior to Admission medications   Medication Sig Start Date End Date Taking? Authorizing Provider  citalopram (CELEXA) 20 MG tablet Take 1 tablet (20 mg total) by mouth daily. 10/19/21   Vigg, Avanti, MD  nicotine (NICODERM CQ) 14 mg/24hr patch Place 1 patch (14 mg total) onto the skin daily. 10/19/21   Vigg, Avanti, MD  nicotine (NICODERM CQ) 7 mg/24hr patch Place 1 patch (7 mg total) onto the skin  daily. 10/19/21   Vigg, Avanti, MD  omeprazole (PRILOSEC) 40 MG capsule Take 1 capsule (40 mg total) by mouth daily. 10/05/21   Loura Pardon, MD    Family History  Problem Relation Age of Onset   Cancer Maternal Grandfather        nasal   Hypertension Mother    Other Father    Diabetes Maternal Grandmother    Heart disease Paternal Grandfather      Social History   Tobacco Use   Smoking status: Every Day    Packs/day: 1.00    Types: Cigarettes   Smokeless tobacco: Never  Vaping Use   Vaping Use: Former  Substance Use Topics   Alcohol use: Yes    Alcohol/week: 0.0 standard drinks    Comment: pt states very rare   Drug use: Yes    Frequency: 12.0 times per week    Types: Marijuana    Comment: 1-2 times per day    Allergies as of 11/01/2021 - Review Complete 10/19/2021  Allergen Reaction Noted   Wellbutrin [bupropion] Other (See Comments) 08/13/2020    Review of Systems:    All systems reviewed and negative except where noted in HPI.   Physical Exam:  There were no vitals taken for  this visit. No LMP for male patient. General:   Alert,  Well-developed, well-nourished, pleasant and cooperative in NAD Head:  Normocephalic and atraumatic. Eyes:  Sclera clear, no icterus.   Conjunctiva pink. Ears:  Normal auditory acuity. Neck:  Supple; no masses or thyromegaly. Lungs:  Respirations even and unlabored.  Clear throughout to auscultation.   No wheezes, crackles, or rhonchi. No acute distress. Heart:  Regular rate and rhythm; no murmurs, clicks, rubs, or gallops. Abdomen:  Normal bowel sounds.  No bruits.  Soft, positive tenderness to 1 finger palpation while flexing the abdominal wall muscles and non-distended without masses, hepatosplenomegaly or hernias noted.  No guarding or rebound tenderness.  Positive Carnett sign.   Rectal:  Deferred.  Pulses:  Normal pulses noted. Extremities:  No clubbing or edema.  No cyanosis. Neurologic:  Alert and oriented x3;  grossly normal  neurologically. Skin:  Intact without significant lesions or rashes.  No jaundice. Lymph Nodes:  No significant cervical adenopathy. Psych:  Alert and cooperative. Normal mood and affect.  Imaging Studies: DG Ribs Unilateral Left  Result Date: 10/05/2021 CLINICAL DATA:  Rib tenderness, fall 1 month ago EXAM: LEFT RIBS - 2 VIEW; RIGHT RIBS - 2 VIEW COMPARISON:  None. FINDINGS: No displaced fracture or other bone lesions are seen involving the ribs. No acute abnormality of the lungs. IMPRESSION: 1.  No displaced rib fracture or other findings to explain pain. 2.  No acute abnormality of the lungs. Electronically Signed   By: Jearld Lesch M.D.   On: 10/05/2021 13:22   DG Ribs Unilateral Right  Result Date: 10/05/2021 CLINICAL DATA:  Rib tenderness, fall 1 month ago EXAM: LEFT RIBS - 2 VIEW; RIGHT RIBS - 2 VIEW COMPARISON:  None. FINDINGS: No displaced fracture or other bone lesions are seen involving the ribs. No acute abnormality of the lungs. IMPRESSION: 1.  No displaced rib fracture or other findings to explain pain. 2.  No acute abnormality of the lungs. Electronically Signed   By: Jearld Lesch M.D.   On: 10/05/2021 13:22    Assessment and Plan:   Rodney Myers is a 41 y.o. y/o male with a history of dyspepsia and a fall on his shoulder without any direct injury to his ribs but reports that the pain started at the time of the fall.  The patient clearly has worsening of his chest wall pain while flexing the abdominal wall muscles while raising his leg 6 inches above the exam table and palpating with 1 finger.  Due to the patient's dyspepsia and long history of heartburn prior to taking a PPI the patient will be set up for an upper endoscopy.  The patient has been explained to use warm compresses and anti-inflammatory medication for the musculoskeletal pain.  The patient has been explained the plan agrees with it.    Rodney Minium, MD. Rodney Myers    Note: This dictation was prepared with Dragon  dictation along with smaller phrase technology. Any transcriptional errors that result from this process are unintentional.

## 2021-10-31 NOTE — H&P (View-Only) (Signed)
Gastroenterology Consultation  Referring Provider:     Loura Pardon, MD Primary Care Physician:  Loura Pardon, MD Primary Gastroenterologist:  Dr. Servando Snare     Reason for Consultation:     Epigastric pain        HPI:   Rodney Myers is a 41 y.o. y/o male referred for consultation & management of Epigastric pain by Dr. Loura Pardon, MD.  This patient comes to see me after seeing his primary care provider in April for epigastric pain after falling off of her chair or changing a light bulb.  The patient then followed up earlier this month reporting epigastric pain and was sent for a GI referral. The patient was noted to be on a PPI and was told to not eat before his laying down and other antireflux measures. The patient had a x-ray sent off that did not show any fractures of his ribs. 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  He states that if he applies pressure with his hand to the area that hurts which is usually in the right side of his sternum that the pain will go away.  There is no report of any unexplained weight loss and the patient states that he is been tobacco free for 2 weeks and stopped red bull soda at the recommendation of his primary care physician.  Past Medical History:  Diagnosis Date   Allergy    Anxiety    GERD (gastroesophageal reflux disease)    Hypertension    Kidney calculi     Past Surgical History:  Procedure Laterality Date   ROOT CANAL  07/2014    Prior to Admission medications   Medication Sig Start Date End Date Taking? Authorizing Provider  citalopram (CELEXA) 20 MG tablet Take 1 tablet (20 mg total) by mouth daily. 10/19/21   Vigg, Avanti, MD  nicotine (NICODERM CQ) 14 mg/24hr patch Place 1 patch (14 mg total) onto the skin daily. 10/19/21   Vigg, Avanti, MD  nicotine (NICODERM CQ) 7 mg/24hr patch Place 1 patch (7 mg total) onto the skin  daily. 10/19/21   Vigg, Avanti, MD  omeprazole (PRILOSEC) 40 MG capsule Take 1 capsule (40 mg total) by mouth daily. 10/05/21   Loura Pardon, MD    Family History  Problem Relation Age of Onset   Cancer Maternal Grandfather        nasal   Hypertension Mother    Other Father    Diabetes Maternal Grandmother    Heart disease Paternal Grandfather      Social History   Tobacco Use   Smoking status: Every Day    Packs/day: 1.00    Types: Cigarettes   Smokeless tobacco: Never  Vaping Use   Vaping Use: Former  Substance Use Topics   Alcohol use: Yes    Alcohol/week: 0.0 standard drinks    Comment: pt states very rare   Drug use: Yes    Frequency: 12.0 times per week    Types: Marijuana    Comment: 1-2 times per day    Allergies as of 11/01/2021 - Review Complete 10/19/2021  Allergen Reaction Noted   Wellbutrin [bupropion] Other (See Comments) 08/13/2020    Review of Systems:    All systems reviewed and negative except where noted in HPI.   Physical Exam:  There were no vitals taken for  this visit. No LMP for male patient. General:   Alert,  Well-developed, well-nourished, pleasant and cooperative in NAD Head:  Normocephalic and atraumatic. Eyes:  Sclera clear, no icterus.   Conjunctiva pink. Ears:  Normal auditory acuity. Neck:  Supple; no masses or thyromegaly. Lungs:  Respirations even and unlabored.  Clear throughout to auscultation.   No wheezes, crackles, or rhonchi. No acute distress. Heart:  Regular rate and rhythm; no murmurs, clicks, rubs, or gallops. Abdomen:  Normal bowel sounds.  No bruits.  Soft, positive tenderness to 1 finger palpation while flexing the abdominal wall muscles and non-distended without masses, hepatosplenomegaly or hernias noted.  No guarding or rebound tenderness.  Positive Carnett sign.   Rectal:  Deferred.  Pulses:  Normal pulses noted. Extremities:  No clubbing or edema.  No cyanosis. Neurologic:  Alert and oriented x3;  grossly normal  neurologically. Skin:  Intact without significant lesions or rashes.  No jaundice. Lymph Nodes:  No significant cervical adenopathy. Psych:  Alert and cooperative. Normal mood and affect.  Imaging Studies: DG Ribs Unilateral Left  Result Date: 10/05/2021 CLINICAL DATA:  Rib tenderness, fall 1 month ago EXAM: LEFT RIBS - 2 VIEW; RIGHT RIBS - 2 VIEW COMPARISON:  None. FINDINGS: No displaced fracture or other bone lesions are seen involving the ribs. No acute abnormality of the lungs. IMPRESSION: 1.  No displaced rib fracture or other findings to explain pain. 2.  No acute abnormality of the lungs. Electronically Signed   By: Jearld Lesch M.D.   On: 10/05/2021 13:22   DG Ribs Unilateral Right  Result Date: 10/05/2021 CLINICAL DATA:  Rib tenderness, fall 1 month ago EXAM: LEFT RIBS - 2 VIEW; RIGHT RIBS - 2 VIEW COMPARISON:  None. FINDINGS: No displaced fracture or other bone lesions are seen involving the ribs. No acute abnormality of the lungs. IMPRESSION: 1.  No displaced rib fracture or other findings to explain pain. 2.  No acute abnormality of the lungs. Electronically Signed   By: Jearld Lesch M.D.   On: 10/05/2021 13:22    Assessment and Plan:   Rodney Myers is a 41 y.o. y/o male with a history of dyspepsia and a fall on his shoulder without any direct injury to his ribs but reports that the pain started at the time of the fall.  The patient clearly has worsening of his chest wall pain while flexing the abdominal wall muscles while raising his leg 6 inches above the exam table and palpating with 1 finger.  Due to the patient's dyspepsia and long history of heartburn prior to taking a PPI the patient will be set up for an upper endoscopy.  The patient has been explained to use warm compresses and anti-inflammatory medication for the musculoskeletal pain.  The patient has been explained the plan agrees with it.    [name], MD. [name]    Note: This dictation was prepared with Dragon  dictation along with smaller phrase technology. Any transcriptional errors that result from this process are unintentional.

## 2021-11-01 ENCOUNTER — Other Ambulatory Visit: Payer: Self-pay

## 2021-11-01 ENCOUNTER — Encounter: Payer: Self-pay | Admitting: Gastroenterology

## 2021-11-01 ENCOUNTER — Ambulatory Visit (INDEPENDENT_AMBULATORY_CARE_PROVIDER_SITE_OTHER): Payer: BC Managed Care – PPO | Admitting: Gastroenterology

## 2021-11-01 VITALS — BP 105/67 | HR 80 | Temp 97.7°F | Ht 63.0 in | Wt 169.4 lb

## 2021-11-01 DIAGNOSIS — R1013 Epigastric pain: Secondary | ICD-10-CM

## 2021-11-01 DIAGNOSIS — K219 Gastro-esophageal reflux disease without esophagitis: Secondary | ICD-10-CM | POA: Diagnosis not present

## 2021-11-02 DIAGNOSIS — F411 Generalized anxiety disorder: Secondary | ICD-10-CM | POA: Diagnosis not present

## 2021-11-03 ENCOUNTER — Encounter: Payer: Self-pay | Admitting: Gastroenterology

## 2021-11-04 ENCOUNTER — Encounter
Admission: RE | Disposition: A | Discharge status: Home / Self Care | Payer: Self-pay | Source: Home / Self Care | Attending: Gastroenterology

## 2021-11-04 ENCOUNTER — Ambulatory Visit
Admission: RE | Admit: 2021-11-04 | Discharge: 2021-11-04 | Disposition: A | Discharge status: Home / Self Care | Payer: BC Managed Care – PPO | Attending: Gastroenterology | Admitting: Gastroenterology

## 2021-11-04 ENCOUNTER — Encounter: Payer: Self-pay | Admitting: Gastroenterology

## 2021-11-04 ENCOUNTER — Ambulatory Visit: Payer: BC Managed Care – PPO | Admitting: Anesthesiology

## 2021-11-04 DIAGNOSIS — Z87891 Personal history of nicotine dependence: Secondary | ICD-10-CM | POA: Insufficient documentation

## 2021-11-04 DIAGNOSIS — F418 Other specified anxiety disorders: Secondary | ICD-10-CM | POA: Insufficient documentation

## 2021-11-04 DIAGNOSIS — R1013 Epigastric pain: Secondary | ICD-10-CM | POA: Diagnosis not present

## 2021-11-04 DIAGNOSIS — K219 Gastro-esophageal reflux disease without esophagitis: Secondary | ICD-10-CM | POA: Insufficient documentation

## 2021-11-04 DIAGNOSIS — K449 Diaphragmatic hernia without obstruction or gangrene: Secondary | ICD-10-CM | POA: Diagnosis not present

## 2021-11-04 HISTORY — DX: Personal history of urinary calculi: Z87.442

## 2021-11-04 HISTORY — PX: ESOPHAGOGASTRODUODENOSCOPY (EGD) WITH PROPOFOL: SHX5813

## 2021-11-04 SURGERY — ESOPHAGOGASTRODUODENOSCOPY (EGD) WITH PROPOFOL
Anesthesia: General

## 2021-11-04 MED ORDER — ONDANSETRON HCL 4 MG/2ML IJ SOLN
INTRAMUSCULAR | Status: AC
  Filled 2021-11-04: qty 2

## 2021-11-04 MED ORDER — PROPOFOL 10 MG/ML IV BOLUS
INTRAVENOUS | Status: DC | PRN
  Administered 2021-11-04: 80 mg via INTRAVENOUS
  Administered 2021-11-04: 20 mg via INTRAVENOUS
  Administered 2021-11-04: 40 mg via INTRAVENOUS

## 2021-11-04 MED ORDER — GLYCOPYRROLATE 0.2 MG/ML IJ SOLN
INTRAMUSCULAR | Status: AC
  Filled 2021-11-04: qty 1

## 2021-11-04 MED ORDER — SODIUM CHLORIDE 0.9 % IV SOLN
INTRAVENOUS | Status: DC
  Administered 2021-11-04: 1000 mL via INTRAVENOUS

## 2021-11-04 MED ORDER — DEXMEDETOMIDINE HCL IN NACL 200 MCG/50ML IV SOLN
INTRAVENOUS | Status: DC | PRN
  Administered 2021-11-04: 12 ug via INTRAVENOUS
  Administered 2021-11-04: 8 ug via INTRAVENOUS

## 2021-11-04 MED ORDER — GLYCOPYRROLATE 0.2 MG/ML IJ SOLN
INTRAMUSCULAR | Status: DC | PRN
  Administered 2021-11-04: .2 mg via INTRAVENOUS

## 2021-11-04 NOTE — Anesthesia Postprocedure Evaluation (Signed)
Anesthesia Post Note  Patient: Rodney Myers  Procedure(s) Performed: ESOPHAGOGASTRODUODENOSCOPY (EGD) WITH PROPOFOL  Patient location during evaluation: PACU Anesthesia Type: General Level of consciousness: awake and oriented Pain management: satisfactory to patient Vital Signs Assessment: post-procedure vital signs reviewed and stable Respiratory status: spontaneous breathing and respiratory function stable Cardiovascular status: stable Anesthetic complications: no   No notable events documented.   Last Vitals:  Vitals:   11/04/21 1156 11/04/21 1216  BP: 111/73 113/80  Pulse: 65   Resp: 19   Temp: (!) 36.3 C   SpO2: 95%     Last Pain:  Vitals:   11/04/21 1216  TempSrc:   PainSc: 0-No pain                 VAN STAVEREN,Jayvan Mcshan

## 2021-11-04 NOTE — Anesthesia Preprocedure Evaluation (Signed)
Anesthesia Evaluation  Patient identified by MRN, date of birth, ID band Patient awake    Reviewed: Allergy & Precautions, NPO status , Patient's Chart, lab work & pertinent test results  Airway Mallampati: III  TM Distance: >3 FB Neck ROM: Full    Dental  (+) Teeth Intact   Pulmonary neg pulmonary ROS, Patient abstained from smoking., former smoker,    Pulmonary exam normal  + decreased breath sounds      Cardiovascular Exercise Tolerance: Good hypertension, negative cardio ROS Normal cardiovascular exam Rhythm:Regular Rate:Normal     Neuro/Psych Anxiety Depression negative neurological ROS  negative psych ROS   GI/Hepatic negative GI ROS, Neg liver ROS, GERD  ,  Endo/Other  negative endocrine ROS  Renal/GU negative Renal ROS  negative genitourinary   Musculoskeletal   Abdominal Normal abdominal exam  (+)   Peds negative pediatric ROS (+)  Hematology negative hematology ROS (+)   Anesthesia Other Findings Past Medical History: No date: Allergy No date: Anxiety No date: GERD (gastroesophageal reflux disease) No date: History of kidney stones No date: Hypertension  Past Surgical History: 07/2014: ROOT CANAL  BMI    Body Mass Index: 27.51 kg/m      Reproductive/Obstetrics negative OB ROS                             Anesthesia Physical Anesthesia Plan  ASA: 2  Anesthesia Plan: General   Post-op Pain Management:    Induction: Intravenous  PONV Risk Score and Plan: Propofol infusion and TIVA  Airway Management Planned: Natural Airway  Additional Equipment:   Intra-op Plan:   Post-operative Plan:   Informed Consent: I have reviewed the patients History and Physical, chart, labs and discussed the procedure including the risks, benefits and alternatives for the proposed anesthesia with the patient or authorized representative who has indicated his/her understanding and  acceptance.     Dental Advisory Given  Plan Discussed with: CRNA and Surgeon  Anesthesia Plan Comments:         Anesthesia Quick Evaluation

## 2021-11-04 NOTE — Interval H&P Note (Signed)
Lucilla Lame, MD [address] [place], [place] [address] Phone:[phone] Fax : [phone]  Primary Care Physician:  Charlynne Cousins, MD Primary Gastroenterologist:  Dr. Allen Norris  Pre-Procedure History & Physical: HPI:  Rodney Myers is a 41 y.o. male is here for an endoscopy.   Past Medical History:  Diagnosis Date   Allergy    Anxiety    GERD (gastroesophageal reflux disease)    Hypertension    Kidney calculi     Past Surgical History:  Procedure Laterality Date   ROOT CANAL  07/2014    Prior to Admission medications   Medication Sig Start Date End Date Taking? Authorizing Provider  citalopram (CELEXA) 20 MG tablet Take 1 tablet (20 mg total) by mouth daily. 10/19/21   Vigg, Avanti, MD  nicotine (NICODERM CQ) 14 mg/24hr patch Place 1 patch (14 mg total) onto the skin daily. 10/19/21   Vigg, Avanti, MD  nicotine (NICODERM CQ) 7 mg/24hr patch Place 1 patch (7 mg total) onto the skin daily. 10/19/21   Vigg, Avanti, MD  omeprazole (PRILOSEC) 40 MG capsule Take 1 capsule (40 mg total) by mouth daily. 10/05/21   Charlynne Cousins, MD    Allergies as of 11/01/2021 - Review Complete 11/01/2021  Allergen Reaction Noted   Wellbutrin [bupropion] Other (See Comments) 08/13/2020    Family History  Problem Relation Age of Onset   Cancer Maternal Grandfather        nasal   Hypertension Mother    Other Father    Diabetes Maternal Grandmother    Heart disease Paternal Grandfather     Social History   Socioeconomic History   Marital status: Single    Spouse name: Not on file   Number of children: Not on file   Years of education: Not on file   Highest education level: Not on file  Occupational History   Not on file  Tobacco Use   Smoking status: Former    Packs/day: 1.00    Types: Cigarettes   Smokeless tobacco: Never  Vaping Use   Vaping Use: Former  Substance and Sexual Activity   Alcohol use: Yes    Alcohol/week: 0.0 standard drinks    Comment: pt states  very rare   Drug use: Yes    Frequency: 12.0 times per week    Types: Marijuana    Comment: 1-2 times per day   Sexual activity: Never  Other Topics Concern   Not on file  Social History Narrative   Not on file   Social Determinants of Health   Financial Resource Strain: Not on file  Food Insecurity: Not on file  Transportation Needs: Not on file  Physical Activity: Not on file  Stress: Not on file  Social Connections: Not on file  Intimate Partner Violence: Not on file    Review of Systems: See HPI, otherwise negative ROS  Physical Exam: There were no vitals taken for this visit. General:   Alert,  pleasant and cooperative in NAD Head:  Normocephalic and atraumatic. Neck:  Supple; no masses or thyromegaly. Lungs:  Clear throughout to auscultation.    Heart:  Regular rate and rhythm. Abdomen:  Soft, nontender and nondistended. Normal bowel sounds, without guarding, and without rebound.   Neurologic:  Alert and  oriented x4;  grossly normal neurologically.  Impression/Plan: Rodney Myers is here for an endoscopy to be performed for epigastric pain and dyspepsia  Risks, benefits, limitations, and alternatives regarding  endoscopy have been reviewed  with the patient.  Questions have been answered.  All parties agreeable.   Lucilla Lame, MD  11/04/2021, 11:16 AM

## 2021-11-04 NOTE — Transfer of Care (Signed)
Immediate Anesthesia Transfer of Care Note  Patient: EDUARD PENKALA  Procedure(s) Performed: ESOPHAGOGASTRODUODENOSCOPY (EGD) WITH PROPOFOL  Patient Location: PACU and Endoscopy Unit  Anesthesia Type:General  Level of Consciousness: drowsy and patient cooperative  Airway & Oxygen Therapy: Patient Spontanous Breathing  Post-op Assessment: Report given to RN and Post -op Vital signs reviewed and stable  Post vital signs: Reviewed and stable  Last Vitals:  Vitals Value Taken Time  BP 111/73 11/04/21 1157  Temp 36.3 C 11/04/21 1156  Pulse 64 11/04/21 1159  Resp 19 11/04/21 1159  SpO2 96 % 11/04/21 1159  Vitals shown include unvalidated device data.  Last Pain:  Vitals:   11/04/21 1156  TempSrc: Temporal  PainSc: Asleep         Complications: No notable events documented.

## 2021-11-04 NOTE — Op Note (Signed)
[facility] Gastroenterology Patient Name: Rodney Myers Procedure Date: 11/04/2021 11:41 AM MRN: [id number] Account #: [id number] Date of Birth: 07/16/1980 Admit Type: Outpatient Age: 41 Room: [facility] ENDO ROOM 4 Gender: Male Note Status: Finalized Instrument Name: Upper Endoscope [id number] Procedure:             Upper GI endoscopy Indications:           Epigastric abdominal pain, Dyspepsia Providers:             [name] MD, MD Referring MD:          [name] MD, MD (Referring MD), Avanti Vigg                         (Referring MD) Medicines:             Propofol per Anesthesia Complications:         No immediate complications. Procedure:             Pre-Anesthesia Assessment:                        - Prior to the procedure, a History and Physical was                         performed, and patient medications and allergies were                         reviewed. The patient's tolerance of previous                         anesthesia was also reviewed. The risks and benefits                         of the procedure and the sedation options and risks                         were discussed with the patient. All questions were                         answered, and informed consent was obtained. Prior                         Anticoagulants: The patient has taken no previous                         anticoagulant or antiplatelet agents. ASA Grade                         Assessment: II - A patient with mild systemic disease.                         After reviewing the risks and benefits, the patient                         was deemed in satisfactory condition to undergo the                         procedure.  After obtaining informed consent, the endoscope was                         passed under direct vision. Throughout the procedure,                         the patient's blood pressure, pulse, and oxygen                         saturations were  monitored continuously. The Endoscope                         was introduced through the mouth, and advanced to the                         second part of duodenum. The upper GI endoscopy was                         accomplished without difficulty. The patient tolerated                         the procedure well. Findings:      A small hiatal hernia was present.      The entire examined stomach was normal.      The examined duodenum was normal. Impression:            - Small hiatal hernia.                        - Normal stomach.                        - Normal examined duodenum.                        - No specimens collected. Recommendation:        - Discharge patient to home.                        - Resume previous diet.                        - Continue present medications. Procedure Code(s):     --- Professional ---                        [phone], Esophagogastroduodenoscopy, flexible,                         transoral; diagnostic, including collection of                         specimen(s) by brushing or washing, when performed                         (separate procedure) Diagnosis Code(s):     --- Professional ---                        R10.13, Epigastric pain CPT copyright 2019 American Medical Association. All rights reserved. The codes documented in this report are preliminary and upon coder review may  be revised  to meet current compliance requirements. Midge Miniumarren Jeaninne Lodico MD, MD 11/04/2021 11:56:01 AM This report has been signed electronically. Number of Addenda: 0 Note Initiated On: 11/04/2021 11:41 AM Estimated Blood Loss:  Estimated blood loss: none.      [facility]

## 2021-11-05 ENCOUNTER — Encounter: Payer: Self-pay | Admitting: Gastroenterology

## 2021-11-05 ENCOUNTER — Encounter: Payer: Self-pay | Admitting: Internal Medicine

## 2021-11-09 ENCOUNTER — Telehealth: Payer: BC Managed Care – PPO | Admitting: Internal Medicine

## 2021-11-09 ENCOUNTER — Encounter: Payer: Self-pay | Admitting: Internal Medicine

## 2021-11-09 DIAGNOSIS — M791 Myalgia, unspecified site: Secondary | ICD-10-CM | POA: Insufficient documentation

## 2021-11-09 NOTE — Progress Notes (Signed)
There were no vitals taken for this visit.   Subjective:    Patient ID: Rodney Myers, male    DOB: Mar 15, 1981, 41 y.o.   MRN: [id number]  Chief Complaint  Patient presents with  . Endoscope    Follow up after seeing GI, dx hiatal hernia    HPI: Rodney Myers is a 41 y.o. male  Pt has had an EGD - has a problem with soreness in his arms and body has been feeling tense. Has been sore in his body.  Was found to have an hiatal hernia.  Didn't take his temp.  Denies sore throat, no cough, no diarrhea, no n/ v/ abdominal pain.   Epigastric pain better with omeprazole.     Chief Complaint  Patient presents with  . Endoscope    Follow up after seeing GI, dx hiatal hernia    Relevant past medical, surgical, family and social history reviewed and updated as indicated. Interim medical history since our last visit reviewed. Allergies and medications reviewed and updated.  Review of Systems  HENT:  Negative for congestion, dental problem and drooling.   Respiratory:  Negative for apnea, cough, choking, chest tightness and shortness of breath.   Genitourinary:  Negative for difficulty urinating.  Neurological:  Negative for dizziness and facial asymmetry.   Per HPI unless specifically indicated above     Objective:    There were no vitals taken for this visit.  Wt Readings from Last 3 Encounters:  11/04/21 165 lb 5.5 oz (75 kg)  11/01/21 169 lb 6.4 oz (76.8 kg)  10/05/21 163 lb 12.8 oz (74.3 kg)    Physical Exam Constitutional:      General: He is not in acute distress.    Appearance: He is not ill-appearing, toxic-appearing or diaphoretic.  HENT:     Head: Normocephalic and atraumatic.     Nose: No congestion or rhinorrhea.  Neurological:     Mental Status: He is alert.  Psychiatric:        Behavior: Behavior normal.        Thought Content: Thought content normal.        Judgment: Judgment normal.    Results for orders placed or performed in visit on  10/05/21  Comprehensive metabolic panel  Result Value Ref Range   Glucose 103 (H) 70 - 99 mg/dL   BUN 12 6 - 24 mg/dL   Creatinine, Ser 1.01 0.76 - 1.27 mg/dL   eGFR 96 >59 mL/min/1.73   BUN/Creatinine Ratio 12 9 - 20   Sodium 142 134 - 144 mmol/L   Potassium 4.0 3.5 - 5.2 mmol/L   Chloride 105 96 - 106 mmol/L   CO2 21 20 - 29 mmol/L   Calcium 9.5 8.7 - 10.2 mg/dL   Total Protein 6.9 6.0 - 8.5 g/dL   Albumin 4.8 4.0 - 5.0 g/dL   Globulin, Total 2.1 1.5 - 4.5 g/dL   Albumin/Globulin Ratio 2.3 (H) 1.2 - 2.2   Bilirubin Total 0.6 0.0 - 1.2 mg/dL   Alkaline Phosphatase 93 44 - 121 IU/L   AST 30 0 - 40 IU/L   ALT 37 0 - 44 IU/L  CBC with Differential/Platelet  Result Value Ref Range   WBC 6.8 3.4 - 10.8 x10E3/uL   RBC 5.27 4.14 - 5.80 x10E6/uL   Hemoglobin 15.8 13.0 - 17.7 g/dL   Hematocrit 44.5 37.5 - 51.0 %   MCV 84 79 - 97 fL   MCH 30.0 26.6 -  33.0 pg   MCHC 35.5 31.5 - 35.7 g/dL   RDW 12.3 11.6 - 15.4 %   Platelets 218 150 - 450 x10E3/uL   Neutrophils 47 Not Estab. %   Lymphs 34 Not Estab. %   Monocytes 11 Not Estab. %   Eos 7 Not Estab. %   Basos 1 Not Estab. %   Neutrophils Absolute 3.2 1.4 - 7.0 x10E3/uL   Lymphocytes Absolute 2.3 0.7 - 3.1 x10E3/uL   Monocytes Absolute 0.7 0.1 - 0.9 x10E3/uL   EOS (ABSOLUTE) 0.5 (H) 0.0 - 0.4 x10E3/uL   Basophils Absolute 0.1 0.0 - 0.2 x10E3/uL   Immature Granulocytes 0 Not Estab. %   Immature Grans (Abs) 0.0 0.0 - 0.1 x10E3/uL        Current Outpatient Medications:  .  citalopram (CELEXA) 20 MG tablet, Take 1 tablet (20 mg total) by mouth daily., Disp: 30 tablet, Rfl: 1 .  nicotine (NICODERM CQ) 14 mg/24hr patch, Place 1 patch (14 mg total) onto the skin daily., Disp: 28 patch, Rfl: 0 .  nicotine (NICODERM CQ) 7 mg/24hr patch, Place 1 patch (7 mg total) onto the skin daily., Disp: 28 patch, Rfl: 2 .  omeprazole (PRILOSEC) 40 MG capsule, Take 1 capsule (40 mg total) by mouth daily., Disp: 30 capsule, Rfl: 2    Assessment &  Plan:  Hiatal hernia - will need to d/w dr. Allen Norris.   2. Muscle aches ? Sec to viral illness will need to check temp if he needs to get swabbed will need to come by tomorrow for for curbside swabbing for flu / COVID Denies URI symptoms today.  ? Sec to musculoskeletal pain will need to avoid NSAIDS, use tylenol 500 mg q 4-6 hrly.   Problem List Items Addressed This Visit       Other   Muscle soreness - Primary     Follow up plan: Return in about 3 months (around 02/09/2022).

## 2021-11-16 DIAGNOSIS — F411 Generalized anxiety disorder: Secondary | ICD-10-CM | POA: Diagnosis not present

## 2021-12-08 DIAGNOSIS — F411 Generalized anxiety disorder: Secondary | ICD-10-CM | POA: Diagnosis not present

## 2021-12-28 DIAGNOSIS — F411 Generalized anxiety disorder: Secondary | ICD-10-CM | POA: Diagnosis not present

## 2022-01-11 DIAGNOSIS — F411 Generalized anxiety disorder: Secondary | ICD-10-CM | POA: Diagnosis not present

## 2022-01-24 ENCOUNTER — Ambulatory Visit: Payer: BC Managed Care – PPO | Admitting: Gastroenterology

## 2022-01-25 DIAGNOSIS — F411 Generalized anxiety disorder: Secondary | ICD-10-CM | POA: Diagnosis not present

## 2022-02-03 ENCOUNTER — Encounter: Payer: Self-pay | Admitting: Family Medicine

## 2022-02-03 ENCOUNTER — Ambulatory Visit: Payer: BC Managed Care – PPO | Admitting: Family Medicine

## 2022-02-03 VITALS — BP 111/73 | HR 91 | Temp 97.8°F | Wt 180.2 lb

## 2022-02-03 DIAGNOSIS — K449 Diaphragmatic hernia without obstruction or gangrene: Secondary | ICD-10-CM | POA: Diagnosis not present

## 2022-02-03 DIAGNOSIS — F17290 Nicotine dependence, other tobacco product, uncomplicated: Secondary | ICD-10-CM

## 2022-02-03 DIAGNOSIS — F419 Anxiety disorder, unspecified: Secondary | ICD-10-CM | POA: Diagnosis not present

## 2022-02-03 DIAGNOSIS — F339 Major depressive disorder, recurrent, unspecified: Secondary | ICD-10-CM

## 2022-02-03 DIAGNOSIS — K219 Gastro-esophageal reflux disease without esophagitis: Secondary | ICD-10-CM

## 2022-02-03 MED ORDER — CITALOPRAM HYDROBROMIDE 20 MG PO TABS: 20.0000 mg | ORAL_TABLET | Freq: Every day | ORAL | 1 refills | Status: DC

## 2022-02-03 MED ORDER — NICOTINE 21 MG/24HR TD PT24: 21.0000 mg | MEDICATED_PATCH | Freq: Every day | TRANSDERMAL | 2 refills | Status: DC

## 2022-02-03 MED ORDER — OMEPRAZOLE 40 MG PO CPDR: 80.0000 mg | DELAYED_RELEASE_CAPSULE | Freq: Every day | ORAL | 1 refills | Status: DC

## 2022-02-03 NOTE — Progress Notes (Signed)
BP 111/73   Pulse 91   Temp 97.8 F (36.6 C)   Wt 180 lb 3.2 oz (81.7 kg)   SpO2 96%   BMI 29.99 kg/m    Subjective:    Patient ID: Rodney Myers, male    DOB: 04-03-1981, 41 y.o.   MRN: [id number]  HPI: Rodney Myers is a 41 y.o. male  Chief Complaint  Patient presents with   Hernia    Patient states he had EGD done by GI and a small hernia was noted. Per patient the pain is more consistent. Would like to know how to get a consult with a surgeon.    Depression   Gastroesophageal Reflux   [place] into a chair end of April, and was diagnosed with a hiatal hernia. He continues with pain and is wondering if he should see a Psychologist, sport and exercise. He notes that his pain is worse at lunch. Not bad in the AM. 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   GERD GERD control status: uncontrolled Satisfied with current treatment? no Heartburn frequency: most afternoons Medication side effects: no  Medication compliance: excellent Dysphagia: no Odynophagia:  no Hematemesis: no Blood in stool: no EGD: yes  DEPRESSION Mood status: stable Satisfied with current treatment?: yes Symptom severity: mild  Duration of current treatment : chronic Side effects: no Medication compliance: excellent compliance Psychotherapy/counseling: no  Previous psychiatric medications: celexa Depressed mood: yes Anxious mood: yes Anhedonia: no Significant weight loss or gain: no Insomnia: no  Fatigue: yes Feelings of worthlessness or guilt: no Impaired concentration/indecisiveness: no Suicidal ideations: no Hopelessness: no Crying spells: no    02/03/2022    4:26 PM 11/09/2021    3:42 PM 10/05/2021   11:10 AM 09/14/2021    3:46 PM 06/30/2021    3:21 PM  Depression screen PHQ 2/9  Decreased Interest 0 0 0 0 0  Down, Depressed, Hopeless 1 1 0 0 0  PHQ - 2 Score 1 1 0 0 0  Altered sleeping 0 0 0 0 1  Tired, decreased energy 1 1 0 0  0  Change in appetite 0 0 0 1 0  Feeling bad or failure about yourself  0 0 0 0 1  Trouble concentrating 0 0 0 1 0  Moving slowly or fidgety/restless 0 0 0 0 0  Suicidal thoughts 0 0 0 0 0  PHQ-9 Score 2 2 0 2 2  Difficult doing work/chores   Not difficult at all Not difficult at all    SMOKING CESSATION Smoking Status: current every day smoker Smoking Amount: 1ppd Smoking Onset: 22 Smoking Quit Date: not set Smoking triggers: stress Type of tobacco use: cigarettes Children in the house: no Other household members who smoke: no Treatments attempted: chantix- did not do well Pneumovax: not up to date  Relevant past medical, surgical, family and social history reviewed and updated as indicated. Interim medical history since our last visit reviewed. Allergies and medications reviewed and updated.  Review of Systems  Constitutional: Negative.   Respiratory: Negative.    Cardiovascular: Negative.   Gastrointestinal:  Positive for abdominal pain. Negative for abdominal distention, anal bleeding, blood in stool, constipation, diarrhea, nausea, rectal pain and vomiting.  Musculoskeletal: Negative.   Psychiatric/Behavioral: Negative.      Per HPI unless specifically indicated above     Objective:    BP 111/73   Pulse 91   Temp  97.8 F (36.6 C)   Wt 180 lb 3.2 oz (81.7 kg)   SpO2 96%   BMI 29.99 kg/m   Wt Readings from Last 3 Encounters:  02/03/22 180 lb 3.2 oz (81.7 kg)  11/04/21 165 lb 5.5 oz (75 kg)  11/01/21 169 lb 6.4 oz (76.8 kg)    Physical Exam Vitals and nursing note reviewed.  Constitutional:      General: He is not in acute distress.    Appearance: Normal appearance. He is normal weight. He is not ill-appearing, toxic-appearing or diaphoretic.  HENT:     Head: Normocephalic and atraumatic.     Right Ear: External ear normal.     Left Ear: External ear normal.     Nose: Nose normal.     Mouth/Throat:     Mouth: Mucous membranes are moist.     Pharynx:  Oropharynx is clear.  Eyes:     General: No scleral icterus.       Right eye: No discharge.        Left eye: No discharge.     Extraocular Movements: Extraocular movements intact.     Conjunctiva/sclera: Conjunctivae normal.     Pupils: Pupils are equal, round, and reactive to light.  Cardiovascular:     Rate and Rhythm: Normal rate and regular rhythm.     Pulses: Normal pulses.     Heart sounds: Normal heart sounds. No murmur heard.    No friction rub. No gallop.  Pulmonary:     Effort: Pulmonary effort is normal. No respiratory distress.     Breath sounds: Normal breath sounds. No stridor. No wheezing, rhonchi or rales.  Chest:     Chest wall: No tenderness.  Musculoskeletal:        General: Normal range of motion.     Cervical back: Normal range of motion and neck supple.  Skin:    General: Skin is warm and dry.     Capillary Refill: Capillary refill takes less than 2 seconds.     Coloration: Skin is not jaundiced or pale.     Findings: No bruising, erythema, lesion or rash.  Neurological:     General: No focal deficit present.     Mental Status: He is alert and oriented to person, place, and time. Mental status is at baseline.  Psychiatric:        Mood and Affect: Mood normal.        Behavior: Behavior normal.        Thought Content: Thought content normal.        Judgment: Judgment normal.     Results for orders placed or performed in visit on 10/05/21  Comprehensive metabolic panel  Result Value Ref Range   Glucose 103 (H) 70 - 99 mg/dL   BUN 12 6 - 24 mg/dL   Creatinine, Ser 1.01 0.76 - 1.27 mg/dL   eGFR 96 >59 mL/min/1.73   BUN/Creatinine Ratio 12 9 - 20   Sodium 142 134 - 144 mmol/L   Potassium 4.0 3.5 - 5.2 mmol/L   Chloride 105 96 - 106 mmol/L   CO2 21 20 - 29 mmol/L   Calcium 9.5 8.7 - 10.2 mg/dL   Total Protein 6.9 6.0 - 8.5 g/dL   Albumin 4.8 4.0 - 5.0 g/dL   Globulin, Total 2.1 1.5 - 4.5 g/dL   Albumin/Globulin Ratio 2.3 (H) 1.2 - 2.2   Bilirubin  Total 0.6 0.0 - 1.2 mg/dL   Alkaline Phosphatase 93 44 -  121 IU/L   AST 30 0 - 40 IU/L   ALT 37 0 - 44 IU/L  CBC with Differential/Platelet  Result Value Ref Range   WBC 6.8 3.4 - 10.8 x10E3/uL   RBC 5.27 4.14 - 5.80 x10E6/uL   Hemoglobin 15.8 13.0 - 17.7 g/dL   Hematocrit 44.5 37.5 - 51.0 %   MCV 84 79 - 97 fL   MCH 30.0 26.6 - 33.0 pg   MCHC 35.5 31.5 - 35.7 g/dL   RDW 12.3 11.6 - 15.4 %   Platelets 218 150 - 450 x10E3/uL   Neutrophils 47 Not Estab. %   Lymphs 34 Not Estab. %   Monocytes 11 Not Estab. %   Eos 7 Not Estab. %   Basos 1 Not Estab. %   Neutrophils Absolute 3.2 1.4 - 7.0 x10E3/uL   Lymphocytes Absolute 2.3 0.7 - 3.1 x10E3/uL   Monocytes Absolute 0.7 0.1 - 0.9 x10E3/uL   EOS (ABSOLUTE) 0.5 (H) 0.0 - 0.4 x10E3/uL   Basophils Absolute 0.1 0.0 - 0.2 x10E3/uL   Immature Granulocytes 0 Not Estab. %   Immature Grans (Abs) 0.0 0.0 - 0.1 x10E3/uL      Assessment & Plan:   Problem List Items Addressed This Visit       Respiratory   Hiatal hernia - Primary    Does not want to have surgery just yet- will see about treating GERD and if not getting better we will get him into general surgery.        Digestive   GERD (gastroesophageal reflux disease)    Will have him increase his omeprazole to 77m and recheck 6 weeks. If not getting better, will refer to general surgery for hiatal hernia.       Relevant Medications   omeprazole (PRILOSEC) 40 MG capsule     Other   Anxiety    Under good control on current regimen. Continue current regimen. Continue to monitor. Call with any concerns. Refills given.        Relevant Medications   citalopram (CELEXA) 20 MG tablet   Other tobacco product nicotine dependence, uncomplicated    Will continue to cut down and use nicotine patches. Call with any concerns. Continue to monitor.       Relevant Medications   nicotine (NICODERM CQ) 21 mg/24hr patch   [place]    Under good control on current regimen.  Continue current regimen. Continue to monitor. Call with any concerns. Refills given.       Relevant Medications   citalopram (CELEXA) 20 MG tablet     Follow up plan: Return in about 6 weeks (around 03/17/2022) for physical.

## 2022-02-04 ENCOUNTER — Encounter: Payer: Self-pay | Admitting: Family Medicine

## 2022-02-04 DIAGNOSIS — K449 Diaphragmatic hernia without obstruction or gangrene: Secondary | ICD-10-CM | POA: Insufficient documentation

## 2022-02-04 NOTE — Assessment & Plan Note (Signed)
Will continue to cut down and use nicotine patches. Call with any concerns. Continue to monitor.

## 2022-02-04 NOTE — Assessment & Plan Note (Signed)
Will have him increase his omeprazole to 80mg  and recheck 6 weeks. If not getting better, will refer to general surgery for hiatal hernia.

## 2022-02-04 NOTE — Assessment & Plan Note (Signed)
Does not want to have surgery just yet- will see about treating GERD and if not getting better we will get him into general surgery.

## 2022-02-04 NOTE — Assessment & Plan Note (Signed)
Under good control on current regimen. Continue current regimen. Continue to monitor. Call with any concerns. Refills given.   

## 2022-03-01 ENCOUNTER — Encounter: Payer: Self-pay | Admitting: Gastroenterology

## 2022-03-01 ENCOUNTER — Ambulatory Visit (INDEPENDENT_AMBULATORY_CARE_PROVIDER_SITE_OTHER): Payer: BC Managed Care – PPO | Admitting: Gastroenterology

## 2022-03-01 VITALS — BP 126/86 | HR 81 | Temp 98.2°F | Wt 181.0 lb

## 2022-03-01 DIAGNOSIS — K219 Gastro-esophageal reflux disease without esophagitis: Secondary | ICD-10-CM

## 2022-03-01 NOTE — Progress Notes (Signed)
Primary Care Physician: Valerie Roys, DO  Primary Gastroenterologist:  Dr. Lucilla Lame  Chief Complaint  Patient presents with   Abdominal Pain    epigastric   Gastroesophageal Reflux    Pt reports he has been taking 1 capsule QD, not 2     HPI: Rodney Myers is a 41 y.o. male here for follow-up of his abdominal pain.  The patient states that he has had acid breakthrough once after having some pizza but otherwise his GERD has been under control.  He reports that he does not want to consider surgery for the hiatal hernia or reflux.  His pain continues to be epigastric.  Past Medical History:  Diagnosis Date   Allergy    Anxiety    GERD (gastroesophageal reflux disease)    History of kidney stones    Hypertension     Current Outpatient Medications  Medication Sig Dispense Refill   citalopram (CELEXA) 20 MG tablet Take 1 tablet (20 mg total) by mouth daily. 90 tablet 1   nicotine (NICODERM CQ) 21 mg/24hr patch Place 1 patch (21 mg total) onto the skin daily. 28 patch 2   omeprazole (PRILOSEC) 40 MG capsule Take 2 capsules (80 mg total) by mouth daily. 180 capsule 1   No current facility-administered medications for this visit.    Allergies as of 03/01/2022 - Review Complete 03/01/2022  Allergen Reaction Noted   Wellbutrin [bupropion] Other (See Comments) 08/13/2020    ROS:  General: Negative for anorexia, weight loss, fever, chills, fatigue, weakness. ENT: Negative for hoarseness, difficulty swallowing , nasal congestion. CV: Negative for chest pain, angina, palpitations, dyspnea on exertion, peripheral edema.  Respiratory: Negative for dyspnea at rest, dyspnea on exertion, cough, sputum, wheezing.  GI: See history of present illness. GU:  Negative for dysuria, hematuria, urinary incontinence, urinary frequency, nocturnal urination.  Endo: Negative for unusual weight change.    Physical Examination:   BP 126/86   Pulse 81   Temp 98.2 F (36.8 C) (Oral)    Wt 181 lb (82.1 kg)   BMI 30.12 kg/m   General: Well-nourished, well-developed in no acute distress.  Eyes: No icterus. Conjunctivae pink. Abdomen: Bowel sounds are normal, positive tenderness to 1 finger palpation while flexing the abdominal wall muscles, nondistended, no hepatosplenomegaly or masses, no abdominal bruits or hernia , no rebound or guarding.   Extremities: No lower extremity edema. No clubbing or deformities. Neuro: Alert and oriented x 3.  Grossly intact. Skin: Warm and dry, no jaundice.   Psych: Alert and cooperative, normal mood and affect.  Labs:    Imaging Studies: No results found.  Assessment and Plan:   Rodney Myers is a 41 y.o. y/o male who comes in today with good control of his GERD on his present medication.  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  The patient had clear musculoskeletal pain with exacerbation while flexing the abdominal wall muscles while palpating with 1 finger.  The patient has been told that his upper endoscopy did not show any cause for his symptoms and his pain is consistent with musculoskeletal pain.  The patient has been explained the plan and agrees with it.     Lucilla Lame, MD. Marval Regal    Note: This dictation was prepared with Dragon dictation along with smaller phrase technology. Any transcriptional errors that result from this process are  unintentional.

## 2022-03-18 ENCOUNTER — Ambulatory Visit (INDEPENDENT_AMBULATORY_CARE_PROVIDER_SITE_OTHER): Payer: BC Managed Care – PPO | Admitting: Family Medicine

## 2022-03-18 ENCOUNTER — Encounter: Payer: Self-pay | Admitting: Family Medicine

## 2022-03-18 VITALS — BP 129/84 | HR 85 | Temp 98.0°F | Ht 65.0 in | Wt 185.0 lb

## 2022-03-18 DIAGNOSIS — F339 Major depressive disorder, recurrent, unspecified: Secondary | ICD-10-CM

## 2022-03-18 DIAGNOSIS — Z0000 Encounter for general adult medical examination without abnormal findings: Secondary | ICD-10-CM

## 2022-03-18 DIAGNOSIS — H6123 Impacted cerumen, bilateral: Secondary | ICD-10-CM | POA: Diagnosis not present

## 2022-03-18 DIAGNOSIS — Z136 Encounter for screening for cardiovascular disorders: Secondary | ICD-10-CM | POA: Diagnosis not present

## 2022-03-18 DIAGNOSIS — K219 Gastro-esophageal reflux disease without esophagitis: Secondary | ICD-10-CM

## 2022-03-18 DIAGNOSIS — F419 Anxiety disorder, unspecified: Secondary | ICD-10-CM | POA: Diagnosis not present

## 2022-03-18 LAB — URINALYSIS, ROUTINE W REFLEX MICROSCOPIC
Bilirubin, UA: NEGATIVE
Glucose, UA: NEGATIVE
Ketones, UA: NEGATIVE
Leukocytes,UA: NEGATIVE
Nitrite, UA: NEGATIVE
Protein,UA: NEGATIVE
RBC, UA: NEGATIVE
Specific Gravity, UA: >1.03 — ABNORMAL HIGH (ref 1.005–1.030)
Urobilinogen, Ur: 0.2 mg/dL (ref 0.2–1.0)
pH, UA: 5.5 (ref 5.0–7.5)

## 2022-03-18 MED ORDER — OMEPRAZOLE 40 MG PO CPDR: 80.0000 mg | DELAYED_RELEASE_CAPSULE | Freq: Every day | ORAL | 0 refills | Status: DC

## 2022-03-18 MED ORDER — CITALOPRAM HYDROBROMIDE 20 MG PO TABS: 20.0000 mg | ORAL_TABLET | Freq: Every day | ORAL | 0 refills | Status: DC

## 2022-03-18 MED ORDER — NICOTINE 14 MG/24HR TD PT24: 14.0000 mg | MEDICATED_PATCH | Freq: Every day | TRANSDERMAL | 3 refills | Status: DC

## 2022-03-18 NOTE — Progress Notes (Signed)
BP 129/84   Pulse 85   Temp 98 F (36.7 C)   Ht 5\' 5"  (1.651 m)   Wt 185 lb (83.9 kg)   SpO2 98%   BMI 30.79 kg/m    Subjective:    Patient ID: Rodney MassedNicholas J Myers, male    DOB: 17-Apr-1981, 41 y.o.   MRN: [id number]  HPI: Rodney Myers is a 41 y.o. male presenting on 03/18/2022 for comprehensive medical examination. Current medical complaints include:  GERD GERD control status: stable Satisfied with current treatment? yes Heartburn frequency: occasionally  Medication side effects: no  Medication compliance: occasionally Dysphagia: no Odynophagia:  no Hematemesis: no Blood in stool: no EGD: no  ANXIETY/DEPRESSION Duration: chronic Status:stable Anxious mood: no  Excessive worrying: no Irritability: no  Sweating: no Nausea: no Palpitations:no Hyperventilation: no Panic attacks: no Agoraphobia: no  Obscessions/compulsions: no Depressed mood: no    03/18/2022    3:53 PM 02/03/2022    4:26 PM 11/09/2021    3:42 PM 10/05/2021   11:10 AM 09/14/2021    3:46 PM  Depression screen PHQ 2/9  Decreased Interest 0 0 0 0 0  Down, Depressed, Hopeless 1 1 1  0 0  PHQ - 2 Score 1 1 1  0 0  Altered sleeping 0 0 0 0 0  Tired, decreased energy 1 1 1  0 0  Change in appetite 0 0 0 0 1  Feeling bad or failure about yourself  0 0 0 0 0  Trouble concentrating 0 0 0 0 1  Moving slowly or fidgety/restless 0 0 0 0 0  Suicidal thoughts 0 0 0 0 0  PHQ-9 Score 2 2 2  0 2  Difficult doing work/chores Not difficult at all   Not difficult at all Not difficult at all      03/18/2022    3:53 PM 02/03/2022    4:26 PM 11/09/2021    3:43 PM 10/05/2021   11:10 AM  GAD 7 : Generalized Anxiety Score  Nervous, Anxious, on Edge 0 1 0 0  Control/stop worrying 0 0 0 0  Worry too much - different things 1 1 0 0  Trouble relaxing 1 1 0 1  Restless 0 0 0 0  Easily annoyed or irritable 1 2 0 1  Afraid - awful might happen 0 0 0 0  Total GAD 7 Score 3 5 0 2  Anxiety Difficulty  Not difficult at all  Not difficult at all Not difficult at all   Anhedonia: no Weight changes: no Insomnia: no   Hypersomnia: no Fatigue/loss of energy: no Feelings of worthlessness: no Feelings of guilt: no Impaired concentration/indecisiveness: no Suicidal ideations: no  Crying spells: no Recent Stressors/Life Changes: no   Relationship problems: no   Family stress: no     Financial stress: no    Job stress: no    Recent death/loss: no  Interim Problems from his last visit: no  Depression Screen done today and results listed below:     03/18/2022    3:53 PM 02/03/2022    4:26 PM 11/09/2021    3:42 PM 10/05/2021   11:10 AM 09/14/2021    3:46 PM  Depression screen PHQ 2/9  Decreased Interest 0 0 0 0 0  Down, Depressed, Hopeless 1 1 1  0 0  PHQ - 2 Score 1 1 1  0 0  Altered sleeping 0 0 0 0 0  Tired, decreased energy 1 1 1  0 0  Change in appetite 0 0 0  0 1  Feeling bad or failure about yourself  0 0 0 0 0  Trouble concentrating 0 0 0 0 1  Moving slowly or fidgety/restless 0 0 0 0 0  Suicidal thoughts 0 0 0 0 0  PHQ-9 Score 2 2 2  0 2  Difficult doing work/chores Not difficult at all   Not difficult at all Not difficult at all    Past Medical History:  Past Medical History:  Diagnosis Date   Allergy    Anxiety    GERD (gastroesophageal reflux disease)    History of kidney stones    Hypertension     Surgical History:  Past Surgical History:  Procedure Laterality Date   ESOPHAGOGASTRODUODENOSCOPY (EGD) WITH PROPOFOL N/A 11/04/2021   Procedure: ESOPHAGOGASTRODUODENOSCOPY (EGD) WITH PROPOFOL;  Surgeon: 11/06/2021, MD;  Location: [facility] END[facility];  Service: Endoscopy;  Laterality: N/A;   ROOT CANAL  07/2014    Medications:  Current Outpatient Medications on File Prior to Visit  Medication Sig   nicotine (NICODERM CQ) 21 mg/24hr patch Place 1 patch (21 mg total) onto the skin daily.   No current facility-administered medications on file prior to visit.    Allergies:  Allergies   Allergen Reactions   Wellbutrin [Bupropion] Other (See Comments)    irritablity    Social History:  Social History   Socioeconomic History   Marital status: Single    Spouse name: Not on file   Number of children: Not on file   Years of education: Not on file   Highest education level: Not on file  Occupational History   Not on file  Tobacco Use   Smoking status: Every Day    Packs/day: 1.00    Types: Cigarettes   Smokeless tobacco: Never  Vaping Use   Vaping Use: Former  Substance and Sexual Activity   Alcohol use: Not Currently    Comment: pt states very rare   Drug use: Yes    Frequency: 12.0 times per week    Types: Marijuana    Comment: daily   Sexual activity: Never  Other Topics Concern   Not on file  Social History Narrative   Not on file   Social Determinants of Health   Financial Resource Strain: Not on file  Food Insecurity: Not on file  Transportation Needs: Not on file  Physical Activity: Not on file  Stress: Not on file  Social Connections: Not on file  Intimate Partner Violence: Not on file   Social History   Tobacco Use  Smoking Status Every Day   Packs/day: 1.00   Types: Cigarettes  Smokeless Tobacco Never   Social History   Substance and Sexual Activity  Alcohol Use Not Currently   Comment: pt states very rare    Family History:  Family History  Problem Relation Age of Onset   Cancer Maternal Grandfather        nasal   Hypertension Mother    Other Father    Diabetes Maternal Grandmother    Heart disease Paternal Grandfather     Past medical history, surgical history, medications, allergies, family history and social history reviewed with patient today and changes made to appropriate areas of the chart.   Review of Systems  Constitutional:  Positive for diaphoresis. Negative for chills, fever, malaise/fatigue and weight loss.  HENT: Negative.    Eyes: Negative.   Respiratory: Negative.    Cardiovascular: Negative.    Gastrointestinal:  Positive for abdominal pain, heartburn and nausea (a few  weeks ago, resolved now). Negative for blood in stool, constipation, diarrhea, melena and vomiting.  Genitourinary: Negative.   Musculoskeletal:  Positive for myalgias. Negative for back pain, falls, joint pain and neck pain.  Skin: Negative.   Endo/Heme/Allergies: Negative.    All other ROS negative except what is listed above and in the HPI.      Objective:    BP 129/84   Pulse 85   Temp 98 F (36.7 C)   Ht 5\' 5"  (1.651 m)   Wt 185 lb (83.9 kg)   SpO2 98%   BMI 30.79 kg/m   Wt Readings from Last 3 Encounters:  03/18/22 185 lb (83.9 kg)  03/01/22 181 lb (82.1 kg)  02/03/22 180 lb 3.2 oz (81.7 kg)    Physical Exam Vitals and nursing note reviewed.  Constitutional:      General: He is not in acute distress.    Appearance: Normal appearance. He is obese. He is not ill-appearing, toxic-appearing or diaphoretic.  HENT:     Head: Normocephalic and atraumatic.     Right Ear: Ear canal and external ear normal. There is impacted cerumen.     Left Ear: Ear canal and external ear normal. There is impacted cerumen.     Nose: Nose normal. No congestion or rhinorrhea.     Mouth/Throat:     Mouth: Mucous membranes are moist.     Pharynx: Oropharynx is clear. No oropharyngeal exudate or posterior oropharyngeal erythema.  Eyes:     General: No scleral icterus.       Right eye: No discharge.        Left eye: No discharge.     Extraocular Movements: Extraocular movements intact.     Conjunctiva/sclera: Conjunctivae normal.     Pupils: Pupils are equal, round, and reactive to light.  Neck:     Vascular: No carotid bruit.  Cardiovascular:     Rate and Rhythm: Normal rate and regular rhythm.     Pulses: Normal pulses.     Heart sounds: No murmur heard.    No friction rub. No gallop.  Pulmonary:     Effort: Pulmonary effort is normal. No respiratory distress.     Breath sounds: Normal breath sounds. No  stridor. No wheezing, rhonchi or rales.  Chest:     Chest wall: No tenderness.  Abdominal:     General: Abdomen is flat. Bowel sounds are normal. There is no distension.     Palpations: Abdomen is soft. There is no mass.     Tenderness: There is no abdominal tenderness. There is no right CVA tenderness, left CVA tenderness, guarding or rebound.     Hernia: No hernia is present.  Genitourinary:    Comments: Genital exam deferred with shared decision making Musculoskeletal:        General: No swelling, tenderness, deformity or signs of injury.     Cervical back: Normal range of motion and neck supple. No rigidity. No muscular tenderness.     Right lower leg: No edema.     Left lower leg: No edema.  Lymphadenopathy:     Cervical: No cervical adenopathy.  Skin:    General: Skin is warm and dry.     Capillary Refill: Capillary refill takes less than 2 seconds.     Coloration: Skin is not jaundiced or pale.     Findings: No bruising, erythema, lesion or rash.  Neurological:     General: No focal deficit present.     Mental  Status: He is alert and oriented to person, place, and time.     Cranial Nerves: No cranial nerve deficit.     Sensory: No sensory deficit.     Motor: No weakness.     Coordination: Coordination normal.     Gait: Gait normal.     Deep Tendon Reflexes: Reflexes normal.  Psychiatric:        Mood and Affect: Mood normal.        Behavior: Behavior normal.        Thought Content: Thought content normal.        Judgment: Judgment normal.     Results for orders placed or performed in visit on 03/18/22  Urinalysis, Routine w reflex microscopic  Result Value Ref Range   Specific Gravity, UA >1.030 (H) 1.005 - 1.030   pH, UA 5.5 5.0 - 7.5   Color, UA Yellow Yellow   Appearance Ur Clear Clear   Leukocytes,UA Negative Negative   Protein,UA Negative Negative/Trace   Glucose, UA Negative Negative   Ketones, UA Negative Negative   RBC, UA Negative Negative    Bilirubin, UA Negative Negative   Urobilinogen, Ur 0.2 0.2 - 1.0 mg/dL   Nitrite, UA Negative Negative      Assessment & Plan:   Problem List Items Addressed This Visit       Digestive   GERD (gastroesophageal reflux disease)    Doing well on 40mg  omeprazole. Does not want to see surgery. Continue to monitor. Call with any concerns.       Relevant Medications   omeprazole (PRILOSEC) 40 MG capsule     Other   Anxiety    Under good control on current regimen. Continue current regimen. Continue to monitor. Call with any concerns. Refills given.        Relevant Medications   citalopram (CELEXA) 20 MG tablet   Depression, recurrent (HCC)    Under good control on current regimen. Continue current regimen. Continue to monitor. Call with any concerns. Refills given.        Relevant Medications   citalopram (CELEXA) 20 MG tablet   Other Visit Diagnoses     Routine general medical examination at a health care facility    -  Primary   Vaccines up to date. Screening labs checked today. Continue diet and exercise. Call with any concerns.    Relevant Orders   Comprehensive metabolic panel   CBC with Differential/Platelet   Lipid Panel w/o Chol/HDL Ratio   TSH   Urinalysis, Routine w reflex microscopic (Completed)   Hearing loss due to cerumen impaction, bilateral       Ears flushed today with good results.         LABORATORY TESTING:  Health maintenance labs ordered today as discussed above.    IMMUNIZATIONS:   - Tdap: Tetanus vaccination status reviewed: last tetanus booster within 10 years. - Influenza: Given elsewhere - Pneumovax: Not applicable - Prevnar: Not applicable - COVID: Up to date - HPV: Not applicable  PATIENT COUNSELING:    Sexuality: Discussed sexually transmitted diseases, partner selection, use of condoms, avoidance of unintended pregnancy  and contraceptive alternatives.   Advised to avoid cigarette smoking.  I discussed with the patient that  most people either abstain from alcohol or drink within safe limits (<=14/week and <=4 drinks/occasion for males, <=7/weeks and <= 3 drinks/occasion for females) and that the risk for alcohol disorders and other health effects rises proportionally with the number of drinks per week and  how often a drinker exceeds daily limits.  Discussed cessation/primary prevention of drug use and availability of treatment for abuse.   Diet: Encouraged to adjust caloric intake to maintain  or achieve ideal body weight, to reduce intake of dietary saturated fat and total fat, to limit sodium intake by avoiding high sodium foods and not adding table salt, and to maintain adequate dietary potassium and calcium preferably from fresh fruits, vegetables, and low-fat dairy products.    stressed the importance of regular exercise  Injury prevention: Discussed safety belts, safety helmets, smoke detector, smoking near bedding or upholstery.   Dental health: Discussed importance of regular tooth brushing, flossing, and dental visits.   Follow up plan: NEXT PREVENTATIVE PHYSICAL DUE IN 1 YEAR. Return in about 6 months (around 09/17/2022).

## 2022-03-18 NOTE — Assessment & Plan Note (Signed)
Under good control on current regimen. Continue current regimen. Continue to monitor. Call with any concerns. Refills given.   

## 2022-03-18 NOTE — Assessment & Plan Note (Signed)
Doing well on 40mg  omeprazole. Does not want to see surgery. Continue to monitor. Call with any concerns.

## 2022-03-19 LAB — COMPREHENSIVE METABOLIC PANEL
ALT: 46 IU/L — ABNORMAL HIGH (ref 0–44)
AST: 22 IU/L (ref 0–40)
Albumin/Globulin Ratio: 1.8 (ref 1.2–2.2)
Albumin: 4.6 g/dL (ref 4.1–5.1)
Alkaline Phosphatase: 120 IU/L (ref 44–121)
BUN/Creatinine Ratio: 13 (ref 9–20)
BUN: 13 mg/dL (ref 6–24)
Bilirubin Total: 0.5 mg/dL (ref 0.0–1.2)
CO2: 19 mmol/L — ABNORMAL LOW (ref 20–29)
Calcium: 9.4 mg/dL (ref 8.7–10.2)
Chloride: 103 mmol/L (ref 96–106)
Creatinine, Ser: 1.04 mg/dL (ref 0.76–1.27)
Globulin, Total: 2.6 g/dL (ref 1.5–4.5)
Glucose: 119 mg/dL — ABNORMAL HIGH (ref 70–99)
Potassium: 3.9 mmol/L (ref 3.5–5.2)
Sodium: 141 mmol/L (ref 134–144)
Total Protein: 7.2 g/dL (ref 6.0–8.5)
eGFR: 93 mL/min/{1.73_m2} (ref >59)

## 2022-03-19 LAB — CBC WITH DIFFERENTIAL/PLATELET
Basophils Absolute: 0.1 10*3/uL (ref 0.0–0.2)
Basos: 1 %
EOS (ABSOLUTE): 0.6 10*3/uL — ABNORMAL HIGH (ref 0.0–0.4)
Eos: 8 %
Hematocrit: 41.9 % (ref 37.5–51.0)
Hemoglobin: 14.7 g/dL (ref 13.0–17.7)
Immature Grans (Abs): 0 10*3/uL (ref 0.0–0.1)
Immature Granulocytes: 0 %
Lymphocytes Absolute: 2.4 10*3/uL (ref 0.7–3.1)
Lymphs: 30 %
MCH: 28.2 pg (ref 26.6–33.0)
MCHC: 35.1 g/dL (ref 31.5–35.7)
MCV: 80 fL (ref 79–97)
Monocytes Absolute: 0.6 10*3/uL (ref 0.1–0.9)
Monocytes: 8 %
Neutrophils Absolute: 4.2 10*3/uL (ref 1.4–7.0)
Neutrophils: 53 %
Platelets: 256 10*3/uL (ref 150–450)
RBC: 5.21 x10E6/uL (ref 4.14–5.80)
RDW: 13.5 % (ref 11.6–15.4)
WBC: 7.9 10*3/uL (ref 3.4–10.8)

## 2022-03-19 LAB — LIPID PANEL W/O CHOL/HDL RATIO
Cholesterol, Total: 206 mg/dL — ABNORMAL HIGH (ref 100–199)
HDL: 28 mg/dL — ABNORMAL LOW (ref >39)
LDL Chol Calc (NIH): 112 mg/dL — ABNORMAL HIGH (ref 0–99)
Triglycerides: 380 mg/dL — ABNORMAL HIGH (ref 0–149)
VLDL Cholesterol Cal: 66 mg/dL — ABNORMAL HIGH (ref 5–40)

## 2022-03-19 LAB — TSH: TSH: 1.75 u[IU]/mL (ref 0.450–4.500)

## 2022-03-22 ENCOUNTER — Other Ambulatory Visit (HOSPITAL_BASED_OUTPATIENT_CLINIC_OR_DEPARTMENT_OTHER): Payer: Self-pay

## 2022-03-25 ENCOUNTER — Other Ambulatory Visit (HOSPITAL_BASED_OUTPATIENT_CLINIC_OR_DEPARTMENT_OTHER): Payer: Self-pay

## 2022-03-25 MED ORDER — FLUARIX QUADRIVALENT 0.5 ML IM SUSY
PREFILLED_SYRINGE | INTRAMUSCULAR | 0 refills | Status: DC
  Filled 2022-03-25: qty 0.5, 1d supply, fill #0

## 2022-03-28 ENCOUNTER — Encounter: Payer: Self-pay | Admitting: Nurse Practitioner

## 2022-03-28 ENCOUNTER — Telehealth: Payer: Self-pay | Admitting: Family Medicine

## 2022-03-28 ENCOUNTER — Ambulatory Visit: Payer: BC Managed Care – PPO | Admitting: Nurse Practitioner

## 2022-03-28 DIAGNOSIS — H6690 Otitis media, unspecified, unspecified ear: Secondary | ICD-10-CM | POA: Insufficient documentation

## 2022-03-28 DIAGNOSIS — H6502 Acute serous otitis media, left ear: Secondary | ICD-10-CM | POA: Diagnosis not present

## 2022-03-28 MED ORDER — CLOTRIMAZOLE 1 % EX SOLN: CUTANEOUS | 0 refills | Status: DC

## 2022-03-28 MED ORDER — AMOXICILLIN-POT CLAVULANATE 875-125 MG PO TABS: 1.0000 | ORAL_TABLET | Freq: Two times a day (BID) | ORAL | 0 refills | Status: DC

## 2022-03-28 NOTE — Progress Notes (Signed)
BP 131/76   Pulse (!) 56   Temp 98 F (36.7 C) (Oral)   Ht 5' 5"  (1.651 m)   Wt 181 lb 12.8 oz (82.5 kg)   SpO2 98%   BMI 30.25 kg/m    Subjective:    Patient ID: Rodney Myers, male    DOB: [birth year], 41 y.o.   MRN: [id number]  HPI: Rodney Myers is a 41 y.o. male  Chief Complaint  Patient presents with   Ear Problem    Patient says two weeks ago, he came in for a physical and had his ear cleaned. Patient says the following Monday after your physical. He woke up very dizzy. Patient says he went to have the candling perform at a licensed salon on his ear and was informed by the specialist says it was infected. Patient says his ear has been oozing out green stuff and he has been having to wear a cotton ball at nighttime. Patient says he has not been able to hear out of his L ear.    EAR INFECTION Had ears cleaned in office at recent physical, reports he then went to have candling done at licensed salon and they reported left ear was infected.  He states there has been green stuff coming out of ear and using cotton ball at night.  Symptoms have been present  for one week he reports. Duration: weeks Involved ear(s): left Severity:  mild  Quality:  dull, aching, and throbbing Fever: no Otorrhea: yes greenish Upper respiratory infection symptoms: no Pruritus: no Hearing loss: yes Water immersion  cleaned his ears recently in office Using Q-tips: yes Recurrent otitis media: no -- had one ear infection as a child Status: fluctuating Treatments attempted: none   Relevant past medical, surgical, family and social history reviewed and updated as indicated. Interim medical history since our last visit reviewed. Allergies and medications reviewed and updated.  Review of Systems  Constitutional:  Negative for activity change, diaphoresis, fatigue and fever.  HENT:  Positive for ear discharge, ear pain and hearing loss. Negative for congestion, postnasal drip, rhinorrhea, sinus  pressure and sinus pain.   Respiratory: Negative.    Cardiovascular: Negative.   Musculoskeletal: Negative.   Skin: Negative.   Neurological: Negative.   Psychiatric/Behavioral: Negative.      Per HPI unless specifically indicated above     Objective:    BP 131/76   Pulse (!) 56   Temp 98 F (36.7 C) (Oral)   Ht 5' 5"  (1.651 m)   Wt 181 lb 12.8 oz (82.5 kg)   SpO2 98%   BMI 30.25 kg/m   Wt Readings from Last 3 Encounters:  03/28/22 181 lb 12.8 oz (82.5 kg)  03/18/22 185 lb (83.9 kg)  03/01/22 181 lb (82.1 kg)    Physical Exam Vitals and nursing note reviewed.  Constitutional:      General: He is awake. He is not in acute distress.    Appearance: He is well-developed. He is obese. He is not ill-appearing or toxic-appearing.  HENT:     Head: Normocephalic and atraumatic.     Right Ear: Hearing, tympanic membrane, ear canal and external ear normal. No decreased hearing noted. No drainage.     Left Ear: Decreased hearing noted. Drainage (yeast-like drainage noted in ear canal, thick and yellowish) present. A middle ear effusion is present. There is no impacted cerumen. No foreign body. Tympanic membrane is injected. Tympanic membrane is not perforated.  Ears:     Comments: TM intact to both sides.    Mouth/Throat:     Pharynx: Uvula midline.  Eyes:     General: Lids are normal.        Right eye: No discharge.        Left eye: No discharge.     Conjunctiva/sclera: Conjunctivae normal.     Pupils: Pupils are equal, round, and reactive to light.  Cardiovascular:     Rate and Rhythm: Normal rate and regular rhythm.     Heart sounds: Normal heart sounds, S1 normal and S2 normal. No murmur heard.    No gallop.  Pulmonary:     Effort: Pulmonary effort is normal. No accessory muscle usage or respiratory distress.     Breath sounds: Normal breath sounds.  Abdominal:     General: Bowel sounds are normal.     Palpations: Abdomen is soft.  Musculoskeletal:        General:  Normal range of motion.     Cervical back: Normal range of motion and neck supple.     Right lower leg: No edema.     Left lower leg: No edema.  Skin:    General: Skin is warm and dry.     Capillary Refill: Capillary refill takes less than 2 seconds.  Neurological:     Mental Status: He is alert and oriented to person, place, and time.  Psychiatric:        Mood and Affect: Mood normal.        Behavior: Behavior normal. Behavior is cooperative.        Thought Content: Thought content normal.        Judgment: Judgment normal.    Results for orders placed or performed in visit on 03/18/22  Comprehensive metabolic panel  Result Value Ref Range   Glucose 119 (H) 70 - 99 mg/dL   BUN 13 6 - 24 mg/dL   Creatinine, Ser 1.04 0.76 - 1.27 mg/dL   eGFR 93 >59 mL/min/1.73   BUN/Creatinine Ratio 13 9 - 20   Sodium 141 134 - 144 mmol/L   Potassium 3.9 3.5 - 5.2 mmol/L   Chloride 103 96 - 106 mmol/L   CO2 19 (L) 20 - 29 mmol/L   Calcium 9.4 8.7 - 10.2 mg/dL   Total Protein 7.2 6.0 - 8.5 g/dL   Albumin 4.6 4.1 - 5.1 g/dL   Globulin, Total 2.6 1.5 - 4.5 g/dL   Albumin/Globulin Ratio 1.8 1.2 - 2.2   Bilirubin Total 0.5 0.0 - 1.2 mg/dL   Alkaline Phosphatase 120 44 - 121 IU/L   AST 22 0 - 40 IU/L   ALT 46 (H) 0 - 44 IU/L  CBC with Differential/Platelet  Result Value Ref Range   WBC 7.9 3.4 - 10.8 x10E3/uL   RBC 5.21 4.14 - 5.80 x10E6/uL   Hemoglobin 14.7 13.0 - 17.7 g/dL   Hematocrit 41.9 37.5 - 51.0 %   MCV 80 79 - 97 fL   MCH 28.2 26.6 - 33.0 pg   MCHC 35.1 31.5 - 35.7 g/dL   RDW 13.5 11.6 - 15.4 %   Platelets 256 150 - 450 x10E3/uL   Neutrophils 53 Not Estab. %   Lymphs 30 Not Estab. %   Monocytes 8 Not Estab. %   Eos 8 Not Estab. %   Basos 1 Not Estab. %   Neutrophils Absolute 4.2 1.4 - 7.0 x10E3/uL   Lymphocytes Absolute 2.4 0.7 - 3.1 x10E3/uL  Monocytes Absolute 0.6 0.1 - 0.9 x10E3/uL   EOS (ABSOLUTE) 0.6 (H) 0.0 - 0.4 x10E3/uL   Basophils Absolute 0.1 0.0 - 0.2 x10E3/uL    Immature Granulocytes 0 Not Estab. %   Immature Grans (Abs) 0.0 0.0 - 0.1 x10E3/uL  Lipid Panel w/o Chol/HDL Ratio  Result Value Ref Range   Cholesterol, Total 206 (H) 100 - 199 mg/dL   Triglycerides 380 (H) 0 - 149 mg/dL   HDL 28 (L) >39 mg/dL   VLDL Cholesterol Cal 66 (H) 5 - 40 mg/dL   LDL Chol Calc (NIH) 112 (H) 0 - 99 mg/dL  TSH  Result Value Ref Range   TSH 1.750 0.450 - 4.500 uIU/mL  Urinalysis, Routine w reflex microscopic  Result Value Ref Range   Specific Gravity, UA >1.030 (H) 1.005 - 1.030   pH, UA 5.5 5.0 - 7.5   Color, UA Yellow Yellow   Appearance Ur Clear Clear   Leukocytes,UA Negative Negative   Protein,UA Negative Negative/Trace   Glucose, UA Negative Negative   Ketones, UA Negative Negative   RBC, UA Negative Negative   Bilirubin, UA Negative Negative   Urobilinogen, Ur 0.2 0.2 - 1.0 mg/dL   Nitrite, UA Negative Negative      Assessment & Plan:   Problem List Items Addressed This Visit       Nervous and Auditory   Otitis media    Acute with intact TM + yeast-like drainage in ear canal with irritation.  Will treat at this time with Augmentin BID for 7 days + send in Lotrimin solution to gently place in left ear canal with Q-tip BID for 10 days.  Recommend to avoid swimming or getting water in ear.  Wear ear plugs if going in water.  May take Tylenol as needed for discomfort.  Return in one week for ear check.      Relevant Medications   amoxicillin-clavulanate (AUGMENTIN) 875-125 MG tablet     Follow up plan: Return in about 1 week (around 04/04/2022) for Ear check.

## 2022-03-28 NOTE — Telephone Encounter (Signed)
Per pharmacist medication will likely be out of stock at other pharmacies, medication is OTC and has likely been discontinued. Alternative medication is tolnaftate 1% cream, also OTC.

## 2022-03-28 NOTE — Telephone Encounter (Signed)
PT states pharmacy is saying there is a problem with the medication Clotrimazole ointment that was prescribed today during office visit.  Came back into office and said that the pharmacy informed him that there was a problem with it and they kept trying to call the office.  However none of front end has received any pertaining to this.  He said he thought that they may not have it in stock he didn't know.  Told him I would send a message back to see if it could be resolved.

## 2022-03-28 NOTE — Assessment & Plan Note (Signed)
Acute with intact TM + yeast-like drainage in ear canal with irritation.  Will treat at this time with Augmentin BID for 7 days + send in Lotrimin solution to gently place in left ear canal with Q-tip BID for 10 days.  Recommend to avoid swimming or getting water in ear.  Wear ear plugs if going in water.  May take Tylenol as needed for discomfort.  Return in one week for ear check.

## 2022-03-28 NOTE — Telephone Encounter (Signed)
Per pharmacist, medication is not available. Has not been available for several months. Pharmacist recommends something else be sent in.

## 2022-03-28 NOTE — Patient Instructions (Signed)
Otitis Externa  Otitis externa is an infection of the outer ear canal. The outer ear canal is the area between the outside of the ear and the eardrum. Otitis externa is sometimes called swimmer's ear. What are the causes? Common causes of this condition include: Swimming in dirty water. Moisture in the ear. An injury to the inside of the ear. An object stuck in the ear. A cut or scrape on the outside of the ear or in the ear canal. What increases the risk? You are more likely to get this condition if you go swimming often. What are the signs or symptoms? Itching in the ear. This is often the first symptom. Swelling of the ear. Redness in the ear. Ear pain. The pain may get worse when you pull on your ear. Pus coming from the ear. How is this treated? This condition may be treated with: Antibiotic ear drops. These are often given for 10-14 days. Medicines to reduce itching and swelling. Follow these instructions at home: If you were prescribed antibiotic ear drops, use them as told by your doctor. Do not stop using them even if you start to feel better. Take over-the-counter and prescription medicines only as told by your doctor. Avoid getting water in your ears as told by your doctor. You may be told to avoid swimming or water sports for a few days. Keep all follow-up visits. How is this prevented? Keep your ears dry. Use the corner of a towel to dry your ears after you swim or bathe. Try not to scratch or put things in your ear. Doing these things makes it easier for germs to grow in your ear. Avoid swimming in lakes, dirty water, or swimming pools that may not have the right amount of a chemical called chlorine. Contact a doctor if: You have a fever. Your ear is still red, swollen, or painful after 3 days. You still have pus coming from your ear after 3 days. Your redness, swelling, or pain gets worse. You have a very bad headache. Get help right away if: You have redness,  swelling, and pain or tenderness behind your ear. Summary Otitis externa is an infection of the outer ear canal. Symptoms include pain, redness, and swelling of the ear. If you were prescribed antibiotic ear drops, use them as told by your doctor. Do not stop using them even if you start to feel better. Try not to scratch or put things in your ear. This information is not intended to replace advice given to you by your health care provider. Make sure you discuss any questions you have with your health care provider. Document Revised: 08/12/2020 Document Reviewed: 08/12/2020 Elsevier Patient Education  2023 [employer]  

## 2022-03-29 NOTE — Telephone Encounter (Signed)
Patient aware of medication alternative. No further question.

## 2022-04-04 ENCOUNTER — Ambulatory Visit (INDEPENDENT_AMBULATORY_CARE_PROVIDER_SITE_OTHER): Payer: BC Managed Care – PPO | Admitting: Family Medicine

## 2022-04-04 ENCOUNTER — Encounter: Payer: Self-pay | Admitting: Family Medicine

## 2022-04-04 VITALS — BP 115/74 | HR 72 | Temp 97.8°F | Wt 182.9 lb

## 2022-04-04 DIAGNOSIS — H60332 Swimmer's ear, left ear: Secondary | ICD-10-CM

## 2022-04-04 MED ORDER — CIPROFLOXACIN-DEXAMETHASONE 0.3-0.1 % OT SUSP: 4.0000 [drp] | Freq: Two times a day (BID) | OTIC | 0 refills | Status: DC

## 2022-04-04 NOTE — Progress Notes (Signed)
BP 115/74   Pulse 72   Temp 97.8 F (36.6 C)   Wt 182 lb 14.4 oz (83 kg)   SpO2 98%   BMI 30.44 kg/m    Subjective:    Patient ID: Rodney Myers, male    DOB: 03/02/1981, 41 y.o.   MRN: [id number]  HPI: Rodney Myers is a 41 y.o. male  Chief Complaint  Patient presents with   Ear Pain    Patient here to follow up on left ear infection, has completed abx and still using OTC fungal cream for ear. Patient states pain is a little better, but ear is still draining and can't hear.    EAR PAIN Duration: weeks Involved ear(s): left Severity:  moderate  Quality:  aching and sore Fever: no Otorrhea: yes Upper respiratory infection symptoms: no Pruritus: yes Hearing loss: yes Water immersion no Using Q-tips: yes Recurrent otitis media: no Status: worse Treatments attempted: augmentin and lotrimin   Relevant past medical, surgical, family and social history reviewed and updated as indicated. Interim medical history since our last visit reviewed. Allergies and medications reviewed and updated.  Review of Systems  Constitutional: Negative.   HENT:  Positive for ear pain and hearing loss. Negative for congestion, dental problem, drooling, ear discharge, facial swelling, mouth sores, nosebleeds, postnasal drip, rhinorrhea, sinus pressure, sinus pain, sneezing, sore throat, tinnitus, trouble swallowing and voice change.   Eyes: Negative.   Respiratory: Negative.    Cardiovascular: Negative.   Musculoskeletal: Negative.   Psychiatric/Behavioral: Negative.      Per HPI unless specifically indicated above     Objective:    BP 115/74   Pulse 72   Temp 97.8 F (36.6 C)   Wt 182 lb 14.4 oz (83 kg)   SpO2 98%   BMI 30.44 kg/m   Wt Readings from Last 3 Encounters:  04/04/22 182 lb 14.4 oz (83 kg)  03/28/22 181 lb 12.8 oz (82.5 kg)  03/18/22 185 lb (83.9 kg)    Physical Exam Vitals and nursing note reviewed.  Constitutional:      General: He is not in acute  distress.    Appearance: Normal appearance. He is not ill-appearing, toxic-appearing or diaphoretic.  HENT:     Head: Normocephalic and atraumatic.     Right Ear: Tympanic membrane, ear canal and external ear normal.     Left Ear: External ear normal.     Ears:     Comments: Copius amounts of pus and swelling in L EAC    Nose: Nose normal.     Mouth/Throat:     Mouth: Mucous membranes are moist.     Pharynx: Oropharynx is clear.  Eyes:     General: No scleral icterus.       Right eye: No discharge.        Left eye: No discharge.     Extraocular Movements: Extraocular movements intact.     Conjunctiva/sclera: Conjunctivae normal.     Pupils: Pupils are equal, round, and reactive to light.  Cardiovascular:     Rate and Rhythm: Normal rate and regular rhythm.     Pulses: Normal pulses.     Heart sounds: Normal heart sounds. No murmur heard.    No friction rub. No gallop.  Pulmonary:     Effort: Pulmonary effort is normal. No respiratory distress.     Breath sounds: Normal breath sounds. No stridor. No wheezing, rhonchi or rales.  Chest:     Chest wall: No  tenderness.  Musculoskeletal:        General: Normal range of motion.     Cervical back: Normal range of motion and neck supple.  Skin:    General: Skin is warm and dry.     Capillary Refill: Capillary refill takes less than 2 seconds.     Coloration: Skin is not jaundiced or pale.     Findings: No bruising, erythema, lesion or rash.  Neurological:     General: No focal deficit present.     Mental Status: He is alert and oriented to person, place, and time. Mental status is at baseline.  Psychiatric:        Mood and Affect: Mood normal.        Behavior: Behavior normal.        Thought Content: Thought content normal.        Judgment: Judgment normal.     Results for orders placed or performed in visit on 03/18/22  Comprehensive metabolic panel  Result Value Ref Range   Glucose 119 (H) 70 - 99 mg/dL   BUN 13 6 - 24  mg/dL   Creatinine, Ser 1.04 0.76 - 1.27 mg/dL   eGFR 93 >59 mL/min/1.73   BUN/Creatinine Ratio 13 9 - 20   Sodium 141 134 - 144 mmol/L   Potassium 3.9 3.5 - 5.2 mmol/L   Chloride 103 96 - 106 mmol/L   CO2 19 (L) 20 - 29 mmol/L   Calcium 9.4 8.7 - 10.2 mg/dL   Total Protein 7.2 6.0 - 8.5 g/dL   Albumin 4.6 4.1 - 5.1 g/dL   Globulin, Total 2.6 1.5 - 4.5 g/dL   Albumin/Globulin Ratio 1.8 1.2 - 2.2   Bilirubin Total 0.5 0.0 - 1.2 mg/dL   Alkaline Phosphatase 120 44 - 121 IU/L   AST 22 0 - 40 IU/L   ALT 46 (H) 0 - 44 IU/L  CBC with Differential/Platelet  Result Value Ref Range   WBC 7.9 3.4 - 10.8 x10E3/uL   RBC 5.21 4.14 - 5.80 x10E6/uL   Hemoglobin 14.7 13.0 - 17.7 g/dL   Hematocrit 41.9 37.5 - 51.0 %   MCV 80 79 - 97 fL   MCH 28.2 26.6 - 33.0 pg   MCHC 35.1 31.5 - 35.7 g/dL   RDW 13.5 11.6 - 15.4 %   Platelets 256 150 - 450 x10E3/uL   Neutrophils 53 Not Estab. %   Lymphs 30 Not Estab. %   Monocytes 8 Not Estab. %   Eos 8 Not Estab. %   Basos 1 Not Estab. %   Neutrophils Absolute 4.2 1.4 - 7.0 x10E3/uL   Lymphocytes Absolute 2.4 0.7 - 3.1 x10E3/uL   Monocytes Absolute 0.6 0.1 - 0.9 x10E3/uL   EOS (ABSOLUTE) 0.6 (H) 0.0 - 0.4 x10E3/uL   Basophils Absolute 0.1 0.0 - 0.2 x10E3/uL   Immature Granulocytes 0 Not Estab. %   Immature Grans (Abs) 0.0 0.0 - 0.1 x10E3/uL  Lipid Panel w/o Chol/HDL Ratio  Result Value Ref Range   Cholesterol, Total 206 (H) 100 - 199 mg/dL   Triglycerides 380 (H) 0 - 149 mg/dL   HDL 28 (L) >39 mg/dL   VLDL Cholesterol Cal 66 (H) 5 - 40 mg/dL   LDL Chol Calc (NIH) 112 (H) 0 - 99 mg/dL  TSH  Result Value Ref Range   TSH 1.750 0.450 - 4.500 uIU/mL  Urinalysis, Routine w reflex microscopic  Result Value Ref Range   Specific Gravity, UA >1.030 (H) 1.005 -  1.030   pH, UA 5.5 5.0 - 7.5   Color, UA Yellow Yellow   Appearance Ur Clear Clear   Leukocytes,UA Negative Negative   Protein,UA Negative Negative/Trace   Glucose, UA Negative Negative    Ketones, UA Negative Negative   RBC, UA Negative Negative   Bilirubin, UA Negative Negative   Urobilinogen, Ur 0.2 0.2 - 1.0 mg/dL   Nitrite, UA Negative Negative      Assessment & Plan:   Problem List Items Addressed This Visit   None Visit Diagnoses     Acute swimmer's ear of left side    -  Primary   Stop lotramin. Start ciprodex. Recheck 1 week, if not significantly better by Thursday will send to ENT        Follow up plan: Return in about 1 week (around 04/11/2022).

## 2022-04-14 ENCOUNTER — Encounter: Payer: Self-pay | Admitting: Family Medicine

## 2022-04-14 ENCOUNTER — Ambulatory Visit: Payer: BC Managed Care – PPO | Admitting: Family Medicine

## 2022-04-14 VITALS — BP 116/71 | HR 64 | Temp 97.7°F | Wt 181.0 lb

## 2022-04-14 DIAGNOSIS — H60332 Swimmer's ear, left ear: Secondary | ICD-10-CM | POA: Diagnosis not present

## 2022-04-14 DIAGNOSIS — H6991 Unspecified Eustachian tube disorder, right ear: Secondary | ICD-10-CM | POA: Diagnosis not present

## 2022-04-14 MED ORDER — TRIAMCINOLONE ACETONIDE 40 MG/ML IJ SUSP
40.0000 mg | Freq: Once | INTRAMUSCULAR | Status: AC
  Administered 2022-04-14: 40 mg via INTRAMUSCULAR

## 2022-04-14 MED ORDER — NEOMYCIN-POLYMYXIN-HC 3.5-10000-1 OT SOLN: 4.0000 [drp] | Freq: Four times a day (QID) | OTIC | 0 refills | Status: DC

## 2022-04-14 MED ORDER — PREDNISONE 50 MG PO TABS: 50.0000 mg | ORAL_TABLET | Freq: Every day | ORAL | 0 refills | Status: DC

## 2022-04-14 NOTE — Progress Notes (Signed)
BP 116/71   Pulse 64   Temp 97.7 F (36.5 C)   Wt 181 lb (82.1 kg)   SpO2 97%   BMI 30.12 kg/m    Subjective:    Patient ID: Rodney Myers, male    DOB: 13-Dec-1980, 41 y.o.   MRN: [id number]  HPI: Rodney Myers is a 41 y.o. male  Chief Complaint  Patient presents with   Acute swimmer's ear of left side    Patient states his ear is getting better no longer having drainage. Per patient still has ringing in his ear and feels like his ear is clogged. Patient states now his right ear is draining and feels clogged.    EAR PAIN Duration: 2-3 weeks Involved ear(s): right Severity:  moderate  Quality:  popping  Fever: no Otorrhea: yes Upper respiratory infection symptoms: no Pruritus: no Hearing loss: yes Water immersion no Using Q-tips: no Recurrent otitis media: no Status: better Treatments attempted: ciprodex  Relevant past medical, surgical, family and social history reviewed and updated as indicated. Interim medical history since our last visit reviewed. Allergies and medications reviewed and updated.  Review of Systems  Constitutional: Negative.   HENT:  Positive for ear discharge and ear pain. Negative for congestion, dental problem, drooling, facial swelling, hearing loss, mouth sores, nosebleeds, postnasal drip, rhinorrhea, sinus pressure, sinus pain, sneezing, sore throat, tinnitus, trouble swallowing and voice change.   Eyes: Negative.   Respiratory: Negative.    Cardiovascular: Negative.   Gastrointestinal: Negative.   Musculoskeletal: Negative.   Psychiatric/Behavioral: Negative.      Per HPI unless specifically indicated above     Objective:    BP 116/71   Pulse 64   Temp 97.7 F (36.5 C)   Wt 181 lb (82.1 kg)   SpO2 97%   BMI 30.12 kg/m   Wt Readings from Last 3 Encounters:  04/14/22 181 lb (82.1 kg)  04/04/22 182 lb 14.4 oz (83 kg)  03/28/22 181 lb 12.8 oz (82.5 kg)    Physical Exam Vitals and nursing note reviewed.   Constitutional:      General: He is not in acute distress.    Appearance: Normal appearance. He is normal weight. He is not ill-appearing, toxic-appearing or diaphoretic.  HENT:     Head: Normocephalic and atraumatic.     Right Ear: Tympanic membrane, ear canal and external ear normal.     Left Ear: External ear normal.     Ears:     Comments: L ear EAC about 75% better, still has swelling and pus in canal    Nose: Nose normal. No congestion or rhinorrhea.     Mouth/Throat:     Mouth: Mucous membranes are moist.     Pharynx: Oropharynx is clear. No oropharyngeal exudate or posterior oropharyngeal erythema.  Eyes:     General: No scleral icterus.       Right eye: No discharge.        Left eye: No discharge.     Extraocular Movements: Extraocular movements intact.     Conjunctiva/sclera: Conjunctivae normal.     Pupils: Pupils are equal, round, and reactive to light.  Cardiovascular:     Rate and Rhythm: Normal rate and regular rhythm.     Pulses: Normal pulses.     Heart sounds: Normal heart sounds. No murmur heard.    No friction rub. No gallop.  Pulmonary:     Effort: Pulmonary effort is normal. No respiratory distress.  Breath sounds: Normal breath sounds. No stridor. No wheezing, rhonchi or rales.  Chest:     Chest wall: No tenderness.  Musculoskeletal:        General: Normal range of motion.     Cervical back: Normal range of motion and neck supple.  Skin:    General: Skin is warm and dry.     Capillary Refill: Capillary refill takes less than 2 seconds.     Coloration: Skin is not jaundiced or pale.     Findings: No bruising, erythema, lesion or rash.  Neurological:     General: No focal deficit present.     Mental Status: He is alert and oriented to person, place, and time. Mental status is at baseline.  Psychiatric:        Mood and Affect: Mood normal.        Behavior: Behavior normal.        Thought Content: Thought content normal.        Judgment: Judgment  normal.     Results for orders placed or performed in visit on 03/18/22  Comprehensive metabolic panel  Result Value Ref Range   Glucose 119 (H) 70 - 99 mg/dL   BUN 13 6 - 24 mg/dL   Creatinine, Ser 1.04 0.76 - 1.27 mg/dL   eGFR 93 >59 mL/min/1.73   BUN/Creatinine Ratio 13 9 - 20   Sodium 141 134 - 144 mmol/L   Potassium 3.9 3.5 - 5.2 mmol/L   Chloride 103 96 - 106 mmol/L   CO2 19 (L) 20 - 29 mmol/L   Calcium 9.4 8.7 - 10.2 mg/dL   Total Protein 7.2 6.0 - 8.5 g/dL   Albumin 4.6 4.1 - 5.1 g/dL   Globulin, Total 2.6 1.5 - 4.5 g/dL   Albumin/Globulin Ratio 1.8 1.2 - 2.2   Bilirubin Total 0.5 0.0 - 1.2 mg/dL   Alkaline Phosphatase 120 44 - 121 IU/L   AST 22 0 - 40 IU/L   ALT 46 (H) 0 - 44 IU/L  CBC with Differential/Platelet  Result Value Ref Range   WBC 7.9 3.4 - 10.8 x10E3/uL   RBC 5.21 4.14 - 5.80 x10E6/uL   Hemoglobin 14.7 13.0 - 17.7 g/dL   Hematocrit 41.9 37.5 - 51.0 %   MCV 80 79 - 97 fL   MCH 28.2 26.6 - 33.0 pg   MCHC 35.1 31.5 - 35.7 g/dL   RDW 13.5 11.6 - 15.4 %   Platelets 256 150 - 450 x10E3/uL   Neutrophils 53 Not Estab. %   Lymphs 30 Not Estab. %   Monocytes 8 Not Estab. %   Eos 8 Not Estab. %   Basos 1 Not Estab. %   Neutrophils Absolute 4.2 1.4 - 7.0 x10E3/uL   Lymphocytes Absolute 2.4 0.7 - 3.1 x10E3/uL   Monocytes Absolute 0.6 0.1 - 0.9 x10E3/uL   EOS (ABSOLUTE) 0.6 (H) 0.0 - 0.4 x10E3/uL   Basophils Absolute 0.1 0.0 - 0.2 x10E3/uL   Immature Granulocytes 0 Not Estab. %   Immature Grans (Abs) 0.0 0.0 - 0.1 x10E3/uL  Lipid Panel w/o Chol/HDL Ratio  Result Value Ref Range   Cholesterol, Total 206 (H) 100 - 199 mg/dL   Triglycerides 380 (H) 0 - 149 mg/dL   HDL 28 (L) >39 mg/dL   VLDL Cholesterol Cal 66 (H) 5 - 40 mg/dL   LDL Chol Calc (NIH) 112 (H) 0 - 99 mg/dL  TSH  Result Value Ref Range   TSH 1.750 0.450 -  4.500 uIU/mL  Urinalysis, Routine w reflex microscopic  Result Value Ref Range   Specific Gravity, UA >1.030 (H) 1.005 - 1.030   pH, UA  5.5 5.0 - 7.5   Color, UA Yellow Yellow   Appearance Ur Clear Clear   Leukocytes,UA Negative Negative   Protein,UA Negative Negative/Trace   Glucose, UA Negative Negative   Ketones, UA Negative Negative   RBC, UA Negative Negative   Bilirubin, UA Negative Negative   Urobilinogen, Ur 0.2 0.2 - 1.0 mg/dL   Nitrite, UA Negative Negative      Assessment & Plan:   Problem List Items Addressed This Visit   None Visit Diagnoses     Dysfunction of right eustachian tube    -  Primary   Will treat with triamcinalone followed by prednisone. Call if not getting better.   Relevant Medications   triamcinolone acetonide (KENALOG-40) injection 40 mg (Start on 04/14/2022  3:30 PM)   Acute swimmer's ear of left side       75% better following ciprodex, but not 100%. Would like to avoid ENT if possible. Will do a course of cortisporin. If not better next week will get into ENT.        Follow up plan: Return in about 1 week (around 04/21/2022).

## 2022-04-21 ENCOUNTER — Ambulatory Visit: Payer: BC Managed Care – PPO | Admitting: Family Medicine

## 2022-04-21 ENCOUNTER — Encounter: Payer: Self-pay | Admitting: Family Medicine

## 2022-04-21 VITALS — BP 117/76 | HR 69 | Temp 97.8°F | Wt 185.8 lb

## 2022-04-21 DIAGNOSIS — H60332 Swimmer's ear, left ear: Secondary | ICD-10-CM

## 2022-04-21 DIAGNOSIS — H6992 Unspecified Eustachian tube disorder, left ear: Secondary | ICD-10-CM

## 2022-04-21 MED ORDER — NICOTINE 7 MG/24HR TD PT24: 7.0000 mg | MEDICATED_PATCH | Freq: Every day | TRANSDERMAL | 3 refills | Status: DC

## 2022-04-21 MED ORDER — FLUTICASONE PROPIONATE 50 MCG/ACT NA SUSP: 2.0000 | Freq: Every day | NASAL | 6 refills | Status: DC

## 2022-04-21 NOTE — Progress Notes (Signed)
BP 117/76   Pulse 69   Temp 97.8 F (36.6 C) (Oral)   Wt 185 lb 12.8 oz (84.3 kg)   SpO2 98%   BMI 30.92 kg/m    Subjective:    Patient ID: Rodney Myers, male    DOB: 1981-03-19, 41 y.o.   MRN: [id number]  HPI: Rodney Myers is a 41 y.o. male  Chief Complaint  Patient presents with   Ear Pain    1 week f/up- pt states his ear is feeling a little bit better and he can hear a little better   Feeling about 90% better. No more drainage. Ear is feeling back to normal, but still having some pressure in it and having trouble getting it to pop. No other concerns or complaints at this time.   Relevant past medical, surgical, family and social history reviewed and updated as indicated. Interim medical history since our last visit reviewed. Allergies and medications reviewed and updated.  Review of Systems  Constitutional: Negative.   HENT:  Positive for congestion, ear pain and hearing loss. Negative for dental problem, drooling, ear discharge, facial swelling, mouth sores, nosebleeds, postnasal drip, rhinorrhea, sinus pressure, sinus pain, sneezing, sore throat, tinnitus, trouble swallowing and voice change.   Respiratory: Negative.    Cardiovascular: Negative.   Gastrointestinal: Negative.   Musculoskeletal: Negative.   Psychiatric/Behavioral: Negative.      Per HPI unless specifically indicated above     Objective:    BP 117/76   Pulse 69   Temp 97.8 F (36.6 C) (Oral)   Wt 185 lb 12.8 oz (84.3 kg)   SpO2 98%   BMI 30.92 kg/m   Wt Readings from Last 3 Encounters:  04/21/22 185 lb 12.8 oz (84.3 kg)  04/14/22 181 lb (82.1 kg)  04/04/22 182 lb 14.4 oz (83 kg)    Physical Exam Vitals and nursing note reviewed.  Constitutional:      General: He is not in acute distress.    Appearance: Normal appearance. He is normal weight. He is not ill-appearing, toxic-appearing or diaphoretic.  HENT:     Head: Normocephalic and atraumatic.     Right Ear: External ear  normal.     Left Ear: External ear normal.     Nose: Nose normal.     Mouth/Throat:     Mouth: Mucous membranes are moist.     Pharynx: Oropharynx is clear.  Eyes:     General: No scleral icterus.       Right eye: No discharge.        Left eye: No discharge.     Extraocular Movements: Extraocular movements intact.     Conjunctiva/sclera: Conjunctivae normal.     Pupils: Pupils are equal, round, and reactive to light.  Cardiovascular:     Rate and Rhythm: Normal rate and regular rhythm.     Pulses: Normal pulses.     Heart sounds: Normal heart sounds. No murmur heard.    No friction rub. No gallop.  Pulmonary:     Effort: Pulmonary effort is normal. No respiratory distress.     Breath sounds: Normal breath sounds. No stridor. No wheezing, rhonchi or rales.  Chest:     Chest wall: No tenderness.  Musculoskeletal:        General: Normal range of motion.     Cervical back: Normal range of motion and neck supple.  Skin:    General: Skin is warm and dry.     Capillary Refill:  Capillary refill takes less than 2 seconds.     Coloration: Skin is not jaundiced or pale.     Findings: No bruising, erythema, lesion or rash.  Neurological:     General: No focal deficit present.     Mental Status: He is alert and oriented to person, place, and time. Mental status is at baseline.  Psychiatric:        Mood and Affect: Mood normal.        Behavior: Behavior normal.        Thought Content: Thought content normal.        Judgment: Judgment normal.     Results for orders placed or performed in visit on 03/18/22  Comprehensive metabolic panel  Result Value Ref Range   Glucose 119 (H) 70 - 99 mg/dL   BUN 13 6 - 24 mg/dL   Creatinine, Ser 1.04 0.76 - 1.27 mg/dL   eGFR 93 >59 mL/min/1.73   BUN/Creatinine Ratio 13 9 - 20   Sodium 141 134 - 144 mmol/L   Potassium 3.9 3.5 - 5.2 mmol/L   Chloride 103 96 - 106 mmol/L   CO2 19 (L) 20 - 29 mmol/L   Calcium 9.4 8.7 - 10.2 mg/dL   Total Protein  7.2 6.0 - 8.5 g/dL   Albumin 4.6 4.1 - 5.1 g/dL   Globulin, Total 2.6 1.5 - 4.5 g/dL   Albumin/Globulin Ratio 1.8 1.2 - 2.2   Bilirubin Total 0.5 0.0 - 1.2 mg/dL   Alkaline Phosphatase 120 44 - 121 IU/L   AST 22 0 - 40 IU/L   ALT 46 (H) 0 - 44 IU/L  CBC with Differential/Platelet  Result Value Ref Range   WBC 7.9 3.4 - 10.8 x10E3/uL   RBC 5.21 4.14 - 5.80 x10E6/uL   Hemoglobin 14.7 13.0 - 17.7 g/dL   Hematocrit 41.9 37.5 - 51.0 %   MCV 80 79 - 97 fL   MCH 28.2 26.6 - 33.0 pg   MCHC 35.1 31.5 - 35.7 g/dL   RDW 13.5 11.6 - 15.4 %   Platelets 256 150 - 450 x10E3/uL   Neutrophils 53 Not Estab. %   Lymphs 30 Not Estab. %   Monocytes 8 Not Estab. %   Eos 8 Not Estab. %   Basos 1 Not Estab. %   Neutrophils Absolute 4.2 1.4 - 7.0 x10E3/uL   Lymphocytes Absolute 2.4 0.7 - 3.1 x10E3/uL   Monocytes Absolute 0.6 0.1 - 0.9 x10E3/uL   EOS (ABSOLUTE) 0.6 (H) 0.0 - 0.4 x10E3/uL   Basophils Absolute 0.1 0.0 - 0.2 x10E3/uL   Immature Granulocytes 0 Not Estab. %   Immature Grans (Abs) 0.0 0.0 - 0.1 x10E3/uL  Lipid Panel w/o Chol/HDL Ratio  Result Value Ref Range   Cholesterol, Total 206 (H) 100 - 199 mg/dL   Triglycerides 380 (H) 0 - 149 mg/dL   HDL 28 (L) >39 mg/dL   VLDL Cholesterol Cal 66 (H) 5 - 40 mg/dL   LDL Chol Calc (NIH) 112 (H) 0 - 99 mg/dL  TSH  Result Value Ref Range   TSH 1.750 0.450 - 4.500 uIU/mL  Urinalysis, Routine w reflex microscopic  Result Value Ref Range   Specific Gravity, UA >1.030 (H) 1.005 - 1.030   pH, UA 5.5 5.0 - 7.5   Color, UA Yellow Yellow   Appearance Ur Clear Clear   Leukocytes,UA Negative Negative   Protein,UA Negative Negative/Trace   Glucose, UA Negative Negative   Ketones, UA Negative Negative  RBC, UA Negative Negative   Bilirubin, UA Negative Negative   Urobilinogen, Ur 0.2 0.2 - 1.0 mg/dL   Nitrite, UA Negative Negative      Assessment & Plan:   Problem List Items Addressed This Visit   None Visit Diagnoses     Dysfunction of left  eustachian tube    -  Primary   Will treat with flonase. Call if not getting better. Continue to monitor.   Acute swimmer's ear of left side       Resolved.        Follow up plan: Return As scheduled in April.

## 2022-05-10 DIAGNOSIS — F4322 Adjustment disorder with anxiety: Secondary | ICD-10-CM | POA: Diagnosis not present

## 2022-05-24 DIAGNOSIS — F4322 Adjustment disorder with anxiety: Secondary | ICD-10-CM | POA: Diagnosis not present

## 2022-06-30 ENCOUNTER — Encounter: Payer: BC Managed Care – PPO | Admitting: Internal Medicine

## 2022-07-01 DIAGNOSIS — F4322 Adjustment disorder with anxiety: Secondary | ICD-10-CM | POA: Diagnosis not present

## 2022-07-19 DIAGNOSIS — F4322 Adjustment disorder with anxiety: Secondary | ICD-10-CM | POA: Diagnosis not present

## 2022-08-02 DIAGNOSIS — F4322 Adjustment disorder with anxiety: Secondary | ICD-10-CM | POA: Diagnosis not present

## 2022-08-15 DIAGNOSIS — F4322 Adjustment disorder with anxiety: Secondary | ICD-10-CM | POA: Diagnosis not present

## 2022-08-30 DIAGNOSIS — F4322 Adjustment disorder with anxiety: Secondary | ICD-10-CM | POA: Diagnosis not present

## 2022-09-13 DIAGNOSIS — F4322 Adjustment disorder with anxiety: Secondary | ICD-10-CM | POA: Diagnosis not present

## 2022-09-19 ENCOUNTER — Ambulatory Visit: Payer: BC Managed Care – PPO | Admitting: Family Medicine

## 2022-09-19 ENCOUNTER — Encounter: Payer: Self-pay | Admitting: Family Medicine

## 2022-09-19 VITALS — BP 118/73 | HR 57 | Temp 98.4°F | Wt 187.6 lb

## 2022-09-19 DIAGNOSIS — K219 Gastro-esophageal reflux disease without esophagitis: Secondary | ICD-10-CM

## 2022-09-19 DIAGNOSIS — F339 Major depressive disorder, recurrent, unspecified: Secondary | ICD-10-CM | POA: Diagnosis not present

## 2022-09-19 DIAGNOSIS — F419 Anxiety disorder, unspecified: Secondary | ICD-10-CM | POA: Diagnosis not present

## 2022-09-19 MED ORDER — CITALOPRAM HYDROBROMIDE 20 MG PO TABS: 20.0000 mg | ORAL_TABLET | Freq: Every day | ORAL | 1 refills | Status: DC

## 2022-09-19 MED ORDER — OMEPRAZOLE 40 MG PO CPDR: 40.0000 mg | DELAYED_RELEASE_CAPSULE | Freq: Every day | ORAL | 1 refills | Status: DC

## 2022-09-19 NOTE — Assessment & Plan Note (Signed)
Under good control on current regimen. Continue current regimen. Continue to monitor. Call with any concerns. Refills given.   

## 2022-09-19 NOTE — Progress Notes (Signed)
BP 118/73   Pulse (!) 57   Temp 98.4 F (36.9 C) (Oral)   Wt 187 lb 9.6 oz (85.1 kg)   SpO2 99%   BMI 31.22 kg/m    Subjective:    Patient ID: Rodney Myers, male    DOB: [birth year], 42 y.o.   MRN: [id number]  HPI: Rodney Myers is a 42 y.o. male  Chief Complaint  Patient presents with   Anxiety   Depression   Nicotine Dependence   ANXIETY/DEPRESSION Duration: chronic Status:controlled Anxious mood: no  Excessive worrying: no Irritability: no  Sweating: no Nausea: no Palpitations:no Hyperventilation: no Panic attacks: no Agoraphobia: no  Obscessions/compulsions: no Depressed mood: no    09/19/2022    4:08 PM 03/28/2022   11:31 AM 03/18/2022    3:53 PM 02/03/2022    4:26 PM 11/09/2021    3:42 PM  Depression screen PHQ 2/9  Decreased Interest 0 0 0 0 0  Down, Depressed, Hopeless 0 0 1 1 1   PHQ - 2 Score 0 0 1 1 1   Altered sleeping 0 0 0 0 0  Tired, decreased energy 1 0 1 1 1   Change in appetite 0 0 0 0 0  Feeling bad or failure about yourself  0 0 0 0 0  Trouble concentrating 0 0 0 0 0  Moving slowly or fidgety/restless 0 0 0 0 0  Suicidal thoughts 0 0 0 0 0  PHQ-9 Score 1 0 2 2 2   Difficult doing work/chores Not difficult at all Not difficult at all Not difficult at all        09/19/2022    4:08 PM 03/28/2022   11:31 AM 03/18/2022    3:53 PM 02/03/2022    4:26 PM  GAD 7 : Generalized Anxiety Score  Nervous, Anxious, on Edge 1 0 0 1  Control/stop worrying 0 0 0 0  Worry too much - different things 0 0 1 1  Trouble relaxing 0 1 1 1   Restless 0 0 0 0  Easily annoyed or irritable 1 0 1 2  Afraid - awful might happen 0 0 0 0  Total GAD 7 Score 2 1 3 5   Anxiety Difficulty Not difficult at all Not difficult at all  Not difficult at all   Anhedonia: no Weight changes: no Insomnia: no   Hypersomnia: no Fatigue/loss of energy: no Feelings of worthlessness: no Feelings of guilt: no Impaired concentration/indecisiveness: no Suicidal ideations: no   Crying spells: no Recent Stressors/Life Changes: no   Relationship problems: no   Family stress: no     Financial stress: no    Job stress: no    Recent death/loss: no  GERD GERD control status: controlled Satisfied with current treatment? yes Heartburn frequency: with food choices Medication side effects: no  Medication compliance: stable Dysphagia: no Odynophagia:  no Hematemesis: no Blood in stool: no EGD: no   Relevant past medical, surgical, family and social history reviewed and updated as indicated. Interim medical history since our last visit reviewed. Allergies and medications reviewed and updated.  Review of Systems  Constitutional: Negative.   Respiratory: Negative.    Cardiovascular: Negative.   Musculoskeletal: Negative.   Neurological: Negative.   Psychiatric/Behavioral: Negative.      Per HPI unless specifically indicated above     Objective:    BP 118/73   Pulse (!) 57   Temp 98.4 F (36.9 C) (Oral)   Wt 187 lb 9.6 oz (85.1 kg)  SpO2 99%   BMI 31.22 kg/m   Wt Readings from Last 3 Encounters:  09/19/22 187 lb 9.6 oz (85.1 kg)  04/21/22 185 lb 12.8 oz (84.3 kg)  04/14/22 181 lb (82.1 kg)    Physical Exam Vitals and nursing note reviewed.  Constitutional:      General: He is not in acute distress.    Appearance: Normal appearance. He is normal weight. He is not ill-appearing, toxic-appearing or diaphoretic.  HENT:     Head: Normocephalic and atraumatic.     Right Ear: External ear normal.     Left Ear: External ear normal.     Nose: Nose normal.     Mouth/Throat:     Mouth: Mucous membranes are moist.     Pharynx: Oropharynx is clear.  Eyes:     General: No scleral icterus.       Right eye: No discharge.        Left eye: No discharge.     Extraocular Movements: Extraocular movements intact.     Conjunctiva/sclera: Conjunctivae normal.     Pupils: Pupils are equal, round, and reactive to light.  Cardiovascular:     Rate and  Rhythm: Normal rate and regular rhythm.     Pulses: Normal pulses.     Heart sounds: Normal heart sounds. No murmur heard.    No friction rub. No gallop.  Pulmonary:     Effort: Pulmonary effort is normal. No respiratory distress.     Breath sounds: Normal breath sounds. No stridor. No wheezing, rhonchi or rales.  Chest:     Chest wall: No tenderness.  Musculoskeletal:        General: Normal range of motion.     Cervical back: Normal range of motion and neck supple.  Skin:    General: Skin is warm and dry.     Capillary Refill: Capillary refill takes less than 2 seconds.     Coloration: Skin is not jaundiced or pale.     Findings: No bruising, erythema, lesion or rash.  Neurological:     General: No focal deficit present.     Mental Status: He is alert and oriented to person, place, and time. Mental status is at baseline.  Psychiatric:        Mood and Affect: Mood normal.        Behavior: Behavior normal.        Thought Content: Thought content normal.        Judgment: Judgment normal.     Results for orders placed or performed in visit on 03/18/22  Comprehensive metabolic panel  Result Value Ref Range   Glucose 119 (H) 70 - 99 mg/dL   BUN 13 6 - 24 mg/dL   Creatinine, Ser 5.49 0.76 - 1.27 mg/dL   eGFR 93 >82 ME/BRA/3.09   BUN/Creatinine Ratio 13 9 - 20   Sodium 141 134 - 144 mmol/L   Potassium 3.9 3.5 - 5.2 mmol/L   Chloride 103 96 - 106 mmol/L   CO2 19 (L) 20 - 29 mmol/L   Calcium 9.4 8.7 - 10.2 mg/dL   Total Protein 7.2 6.0 - 8.5 g/dL   Albumin 4.6 4.1 - 5.1 g/dL   Globulin, Total 2.6 1.5 - 4.5 g/dL   Albumin/Globulin Ratio 1.8 1.2 - 2.2   Bilirubin Total 0.5 0.0 - 1.2 mg/dL   Alkaline Phosphatase 120 44 - 121 IU/L   AST 22 0 - 40 IU/L   ALT 46 (H) 0 - 44  IU/L  CBC with Differential/Platelet  Result Value Ref Range   WBC 7.9 3.4 - 10.8 x10E3/uL   RBC 5.21 4.14 - 5.80 x10E6/uL   Hemoglobin 14.7 13.0 - 17.7 g/dL   Hematocrit 16.141.9 09.637.5 - 51.0 %   MCV 80 79 - 97  fL   MCH 28.2 26.6 - 33.0 pg   MCHC 35.1 31.5 - 35.7 g/dL   RDW 04.513.5 40.911.6 - 81.115.4 %   Platelets 256 150 - 450 x10E3/uL   Neutrophils 53 Not Estab. %   Lymphs 30 Not Estab. %   Monocytes 8 Not Estab. %   Eos 8 Not Estab. %   Basos 1 Not Estab. %   Neutrophils Absolute 4.2 1.4 - 7.0 x10E3/uL   Lymphocytes Absolute 2.4 0.7 - 3.1 x10E3/uL   Monocytes Absolute 0.6 0.1 - 0.9 x10E3/uL   EOS (ABSOLUTE) 0.6 (H) 0.0 - 0.4 x10E3/uL   Basophils Absolute 0.1 0.0 - 0.2 x10E3/uL   Immature Granulocytes 0 Not Estab. %   Immature Grans (Abs) 0.0 0.0 - 0.1 x10E3/uL  Lipid Panel w/o Chol/HDL Ratio  Result Value Ref Range   Cholesterol, Total 206 (H) 100 - 199 mg/dL   Triglycerides 914380 (H) 0 - 149 mg/dL   HDL 28 (L) >78>39 mg/dL   VLDL Cholesterol Cal 66 (H) 5 - 40 mg/dL   LDL Chol Calc (NIH) 295112 (H) 0 - 99 mg/dL  TSH  Result Value Ref Range   TSH 1.750 0.450 - 4.500 uIU/mL  Urinalysis, Routine w reflex microscopic  Result Value Ref Range   Specific Gravity, UA >1.030 (H) 1.005 - 1.030   pH, UA 5.5 5.0 - 7.5   Color, UA Yellow Yellow   Appearance Ur Clear Clear   Leukocytes,UA Negative Negative   Protein,UA Negative Negative/Trace   Glucose, UA Negative Negative   Ketones, UA Negative Negative   RBC, UA Negative Negative   Bilirubin, UA Negative Negative   Urobilinogen, Ur 0.2 0.2 - 1.0 mg/dL   Nitrite, UA Negative Negative      Assessment & Plan:   Problem List Items Addressed This Visit       Digestive   GERD (gastroesophageal reflux disease)    Under good control on current regimen. Continue current regimen. Continue to monitor. Call with any concerns. Refills given.        Relevant Medications   omeprazole (PRILOSEC) 40 MG capsule     Other   Anxiety    Under good control on current regimen. Continue current regimen. Continue to monitor. Call with any concerns. Refills given.        Relevant Medications   citalopram (CELEXA) 20 MG tablet   Depression, recurrent - Primary     Under good control on current regimen. Continue current regimen. Continue to monitor. Call with any concerns. Refills given.       Relevant Medications   citalopram (CELEXA) 20 MG tablet     Follow up plan: Return in about 6 months (around 03/21/2023) for physical.

## 2022-09-27 DIAGNOSIS — F4322 Adjustment disorder with anxiety: Secondary | ICD-10-CM | POA: Diagnosis not present

## 2022-10-25 DIAGNOSIS — F4322 Adjustment disorder with anxiety: Secondary | ICD-10-CM | POA: Diagnosis not present

## 2023-02-23 ENCOUNTER — Ambulatory Visit: Payer: PRIVATE HEALTH INSURANCE | Admitting: Family Medicine

## 2023-02-23 ENCOUNTER — Encounter: Payer: Self-pay | Admitting: Family Medicine

## 2023-02-23 VITALS — BP 129/82 | HR 72 | Temp 98.4°F | Wt 181.0 lb

## 2023-02-23 DIAGNOSIS — Z23 Encounter for immunization: Secondary | ICD-10-CM | POA: Diagnosis not present

## 2023-02-23 DIAGNOSIS — H6992 Unspecified Eustachian tube disorder, left ear: Secondary | ICD-10-CM | POA: Diagnosis not present

## 2023-02-23 MED ORDER — PREDNISONE 50 MG PO TABS: 50.0000 mg | ORAL_TABLET | Freq: Every day | ORAL | 0 refills | Status: DC

## 2023-02-23 NOTE — Progress Notes (Signed)
BP 129/82   Pulse 72   Temp 98.4 F (36.9 C) (Oral)   Wt 181 lb (82.1 kg)   SpO2 99%   BMI 30.12 kg/m    Subjective:    Patient ID: Rodney Myers, male    DOB: 1980-09-08, 42 y.o.   MRN: [id number]  HPI: Rodney Myers is a 42 y.o. male  Chief Complaint  Patient presents with   Ear Pain    Started last week feels full and constant ringing    EAR PAIN Duration: about a week Involved ear(s): Left Severity:  moderate  Quality:  full Fever: no Otorrhea: yes Upper respiratory infection symptoms: no Pruritus: yes Hearing loss: yes Water immersion yes Using Q-tips: no Recurrent otitis media: no Status: better Treatments attempted: flushing  Relevant past medical, surgical, family and social history reviewed and updated as indicated. Interim medical history since our last visit reviewed. Allergies and medications reviewed and updated.  Review of Systems  Constitutional: Negative.   HENT:  Positive for ear discharge and ear pain. Negative for congestion, dental problem, drooling, facial swelling, hearing loss, mouth sores, nosebleeds, postnasal drip, rhinorrhea, sinus pressure, sinus pain, sneezing, sore throat, tinnitus, trouble swallowing and voice change.   Respiratory: Negative.    Cardiovascular: Negative.   Skin: Negative.   Psychiatric/Behavioral: Negative.      Per HPI unless specifically indicated above     Objective:    BP 129/82   Pulse 72   Temp 98.4 F (36.9 C) (Oral)   Wt 181 lb (82.1 kg)   SpO2 99%   BMI 30.12 kg/m   Wt Readings from Last 3 Encounters:  02/23/23 181 lb (82.1 kg)  09/19/22 187 lb 9.6 oz (85.1 kg)  04/21/22 185 lb 12.8 oz (84.3 kg)    Physical Exam Vitals and nursing note reviewed.  Constitutional:      General: He is not in acute distress.    Appearance: Normal appearance. He is not ill-appearing, toxic-appearing or diaphoretic.  HENT:     Head: Normocephalic and atraumatic.     Right Ear: Tympanic membrane, ear  canal and external ear normal. There is no impacted cerumen.     Left Ear: Tympanic membrane, ear canal and external ear normal. There is no impacted cerumen.     Nose: Nose normal.     Mouth/Throat:     Mouth: Mucous membranes are moist.     Pharynx: Oropharynx is clear.  Eyes:     General: No scleral icterus.       Right eye: No discharge.        Left eye: No discharge.     Extraocular Movements: Extraocular movements intact.     Conjunctiva/sclera: Conjunctivae normal.     Pupils: Pupils are equal, round, and reactive to light.  Cardiovascular:     Rate and Rhythm: Normal rate and regular rhythm.     Pulses: Normal pulses.     Heart sounds: Normal heart sounds. No murmur heard.    No friction rub. No gallop.  Pulmonary:     Effort: Pulmonary effort is normal. No respiratory distress.     Breath sounds: Normal breath sounds. No stridor. No wheezing, rhonchi or rales.  Chest:     Chest wall: No tenderness.  Musculoskeletal:        General: Normal range of motion.     Cervical back: Normal range of motion and neck supple.  Skin:    General: Skin is warm and dry.  Capillary Refill: Capillary refill takes less than 2 seconds.     Coloration: Skin is not jaundiced or pale.     Findings: No bruising, erythema, lesion or rash.  Neurological:     General: No focal deficit present.     Mental Status: He is alert and oriented to person, place, and time. Mental status is at baseline.  Psychiatric:        Mood and Affect: Mood normal.        Behavior: Behavior normal.        Thought Content: Thought content normal.        Judgment: Judgment normal.     Results for orders placed or performed in visit on 03/18/22  Comprehensive metabolic panel  Result Value Ref Range   Glucose 119 (H) 70 - 99 mg/dL   BUN 13 6 - 24 mg/dL   Creatinine, Ser 5.78 0.76 - 1.27 mg/dL   eGFR 93 >46 NG/EXB/2.84   BUN/Creatinine Ratio 13 9 - 20   Sodium 141 134 - 144 mmol/L   Potassium 3.9 3.5 - 5.2  mmol/L   Chloride 103 96 - 106 mmol/L   CO2 19 (L) 20 - 29 mmol/L   Calcium 9.4 8.7 - 10.2 mg/dL   Total Protein 7.2 6.0 - 8.5 g/dL   Albumin 4.6 4.1 - 5.1 g/dL   Globulin, Total 2.6 1.5 - 4.5 g/dL   Albumin/Globulin Ratio 1.8 1.2 - 2.2   Bilirubin Total 0.5 0.0 - 1.2 mg/dL   Alkaline Phosphatase 120 44 - 121 IU/L   AST 22 0 - 40 IU/L   ALT 46 (H) 0 - 44 IU/L  CBC with Differential/Platelet  Result Value Ref Range   WBC 7.9 3.4 - 10.8 x10E3/uL   RBC 5.21 4.14 - 5.80 x10E6/uL   Hemoglobin 14.7 13.0 - 17.7 g/dL   Hematocrit 13.2 44.0 - 51.0 %   MCV 80 79 - 97 fL   MCH 28.2 26.6 - 33.0 pg   MCHC 35.1 31.5 - 35.7 g/dL   RDW 10.2 72.5 - 36.6 %   Platelets 256 150 - 450 x10E3/uL   Neutrophils 53 Not Estab. %   Lymphs 30 Not Estab. %   Monocytes 8 Not Estab. %   Eos 8 Not Estab. %   Basos 1 Not Estab. %   Neutrophils Absolute 4.2 1.4 - 7.0 x10E3/uL   Lymphocytes Absolute 2.4 0.7 - 3.1 x10E3/uL   Monocytes Absolute 0.6 0.1 - 0.9 x10E3/uL   EOS (ABSOLUTE) 0.6 (H) 0.0 - 0.4 x10E3/uL   Basophils Absolute 0.1 0.0 - 0.2 x10E3/uL   Immature Granulocytes 0 Not Estab. %   Immature Grans (Abs) 0.0 0.0 - 0.1 x10E3/uL  Lipid Panel w/o Chol/HDL Ratio  Result Value Ref Range   Cholesterol, Total 206 (H) 100 - 199 mg/dL   Triglycerides 440 (H) 0 - 149 mg/dL   HDL 28 (L) >34 mg/dL   VLDL Cholesterol Cal 66 (H) 5 - 40 mg/dL   LDL Chol Calc (NIH) 742 (H) 0 - 99 mg/dL  TSH  Result Value Ref Range   TSH 1.750 0.450 - 4.500 uIU/mL  Urinalysis, Routine w reflex microscopic  Result Value Ref Range   Specific Gravity, UA >1.030 (H) 1.005 - 1.030   pH, UA 5.5 5.0 - 7.5   Color, UA Yellow Yellow   Appearance Ur Clear Clear   Leukocytes,UA Negative Negative   Protein,UA Negative Negative/Trace   Glucose, UA Negative Negative   Ketones, UA Negative  Negative   RBC, UA Negative Negative   Bilirubin, UA Negative Negative   Urobilinogen, Ur 0.2 0.2 - 1.0 mg/dL   Nitrite, UA Negative Negative       Assessment & Plan:   Problem List Items Addressed This Visit   None Visit Diagnoses     ETD (Eustachian tube dysfunction), left    -  Primary   Will treat with burst of prednisone and zyrtec. Call if not getting better or getting worse. Continue to monitor. Call with any concerns.   Needs flu shot       Flu shot given today.   Relevant Orders   Flu vaccine trivalent PF, 6mos and older(Flulaval,Afluria,Fluarix,Fluzone)        Follow up plan: Return As scheduled.

## 2023-03-21 ENCOUNTER — Ambulatory Visit (INDEPENDENT_AMBULATORY_CARE_PROVIDER_SITE_OTHER): Payer: PRIVATE HEALTH INSURANCE | Admitting: Family Medicine

## 2023-03-21 ENCOUNTER — Encounter: Payer: Self-pay | Admitting: Family Medicine

## 2023-03-21 VITALS — BP 126/78 | HR 78 | Ht 65.25 in | Wt 181.0 lb

## 2023-03-21 DIAGNOSIS — F339 Major depressive disorder, recurrent, unspecified: Secondary | ICD-10-CM | POA: Diagnosis not present

## 2023-03-21 DIAGNOSIS — Z0000 Encounter for general adult medical examination without abnormal findings: Secondary | ICD-10-CM

## 2023-03-21 DIAGNOSIS — F419 Anxiety disorder, unspecified: Secondary | ICD-10-CM

## 2023-03-21 MED ORDER — NICOTINE 7 MG/24HR TD PT24: 7.0000 mg | MEDICATED_PATCH | Freq: Every day | TRANSDERMAL | 6 refills | Status: DC

## 2023-03-21 MED ORDER — OMEPRAZOLE 40 MG PO CPDR: 40.0000 mg | DELAYED_RELEASE_CAPSULE | Freq: Every day | ORAL | 1 refills | Status: DC

## 2023-03-21 MED ORDER — CITALOPRAM HYDROBROMIDE 20 MG PO TABS: 20.0000 mg | ORAL_TABLET | Freq: Every day | ORAL | 1 refills | Status: DC

## 2023-03-21 NOTE — Assessment & Plan Note (Signed)
Under good control on current regimen. Continue current regimen. Continue to monitor. Call with any concerns. Refills given.   

## 2023-03-21 NOTE — Progress Notes (Signed)
BP 126/78   Pulse 78   Ht 5' 5.25" (1.657 m)   Wt 181 lb (82.1 kg)   SpO2 98%   BMI 29.89 kg/m    Subjective:    Patient ID: Rodney Myers, male    DOB: 1980/10/18, 42 y.o.   MRN: [id number]  HPI: Rodney Myers is a 42 y.o. male presenting on 03/21/2023 for comprehensive medical examination. Current medical complaints include:  ANXIETY/DEPRESSION Duration: chronic Status:stable Anxious mood: yes  Excessive worrying: no Irritability: no  Sweating: no Nausea: no Palpitations:no Hyperventilation: no Panic attacks: no Agoraphobia: no  Obscessions/compulsions: no Depressed mood: yes    03/21/2023    8:08 AM 02/23/2023    3:16 PM 09/19/2022    4:08 PM 03/28/2022   11:31 AM 03/18/2022    3:53 PM  Depression screen PHQ 2/9  Decreased Interest 1 1 0 0 0  Down, Depressed, Hopeless 1 1 0 0 1  PHQ - 2 Score 2 2 0 0 1  Altered sleeping 0 1 0 0 0  Tired, decreased energy 1 1 1  0 1  Change in appetite 0 1 0 0 0  Feeling bad or failure about yourself  1 1 0 0 0  Trouble concentrating 0 0 0 0 0  Moving slowly or fidgety/restless 0 0 0 0 0  Suicidal thoughts 0 1 0 0 0  PHQ-9 Score 4 7 1  0 2  Difficult doing work/chores Not difficult at all Not difficult at all Not difficult at all Not difficult at all Not difficult at all   Anhedonia: no Weight changes: no Insomnia: no   Hypersomnia: no Fatigue/loss of energy: no Feelings of worthlessness: no Feelings of guilt: no Impaired concentration/indecisiveness: no Suicidal ideations: no  Crying spells: no Recent Stressors/Life Changes: no   Relationship problems: no   Family stress: no     Financial stress: no    Job stress: no    Recent death/loss: no   He currently lives with: wife Interim Problems from his last visit: no  Depression Screen done today and results listed below:     03/21/2023    8:08 AM 02/23/2023    3:16 PM 09/19/2022    4:08 PM 03/28/2022   11:31 AM 03/18/2022    3:53 PM  Depression screen PHQ 2/9   Decreased Interest 1 1 0 0 0  Down, Depressed, Hopeless 1 1 0 0 1  PHQ - 2 Score 2 2 0 0 1  Altered sleeping 0 1 0 0 0  Tired, decreased energy 1 1 1  0 1  Change in appetite 0 1 0 0 0  Feeling bad or failure about yourself  1 1 0 0 0  Trouble concentrating 0 0 0 0 0  Moving slowly or fidgety/restless 0 0 0 0 0  Suicidal thoughts 0 1 0 0 0  PHQ-9 Score 4 7 1  0 2  Difficult doing work/chores Not difficult at all Not difficult at all Not difficult at all Not difficult at all Not difficult at all    Past Medical History:  Past Medical History:  Diagnosis Date   Allergy    Anxiety    GERD (gastroesophageal reflux disease)    History of kidney stones    Hypertension     Surgical History:  Past Surgical History:  Procedure Laterality Date   ESOPHAGOGASTRODUODENOSCOPY (EGD) WITH PROPOFOL N/A 11/04/2021   Procedure: ESOPHAGOGASTRODUODENOSCOPY (EGD) WITH PROPOFOL;  Surgeon: [name], MD;  Location: [facility] ENDOSCOPY;  Service: Endoscopy;  Laterality: N/A;   ROOT CANAL  07/2014    Medications:  No current outpatient medications on file prior to visit.   No current facility-administered medications on file prior to visit.    Allergies:  Allergies  Allergen Reactions   Wellbutrin [Bupropion] Other (See Comments)    irritablity    Social History:  Social History   Socioeconomic History   Marital status: Single    Spouse name: Not on file   Number of children: Not on file   Years of education: Not on file   Highest education level: Not on file  Occupational History   Not on file  Tobacco Use   Smoking status: Former    Current packs/day: 1.00    Types: Cigarettes   Smokeless tobacco: Never  Vaping Use   Vaping status: Former  Substance and Sexual Activity   Alcohol use: Not Currently    Comment: pt states very rare   Drug use: Yes    Frequency: 12.0 times per week    Types: Marijuana    Comment: daily   Sexual activity: Never  Other Topics Concern   Not on  file  Social History Narrative   Not on file   Social Determinants of Health   Financial Resource Strain: Not on file  Food Insecurity: Not on file  Transportation Needs: Not on file  Physical Activity: Not on file  Stress: Not on file  Social Connections: Not on file  Intimate Partner Violence: Not on file   Social History   Tobacco Use  Smoking Status Former   Current packs/day: 1.00   Types: Cigarettes  Smokeless Tobacco Never   Social History   Substance and Sexual Activity  Alcohol Use Not Currently   Comment: pt states very rare    Family History:  Family History  Problem Relation Age of Onset   Cancer Maternal Grandfather        nasal   Hypertension Mother    Other Father    Diabetes Maternal Grandmother    Heart disease Paternal Grandfather     Past medical history, surgical history, medications, allergies, family history and social history reviewed with patient today and changes made to appropriate areas of the chart.   Review of Systems  Constitutional: Negative.   HENT: Negative.    Eyes:  Positive for blurred vision. Negative for double vision, photophobia, pain, discharge and redness.  Respiratory: Negative.    Cardiovascular: Negative.   Gastrointestinal: Negative.   Genitourinary: Negative.   Musculoskeletal: Negative.   Skin: Negative.   Neurological: Negative.   Endo/Heme/Allergies: Negative.   Psychiatric/Behavioral: Negative.     All other ROS negative except what is listed above and in the HPI.      Objective:    BP 126/78   Pulse 78   Ht 5' 5.25" (1.657 m)   Wt 181 lb (82.1 kg)   SpO2 98%   BMI 29.89 kg/m   Wt Readings from Last 3 Encounters:  03/21/23 181 lb (82.1 kg)  02/23/23 181 lb (82.1 kg)  09/19/22 187 lb 9.6 oz (85.1 kg)    Physical Exam Vitals and nursing note reviewed.  Constitutional:      General: He is not in acute distress.    Appearance: Normal appearance. He is not ill-appearing, toxic-appearing or  diaphoretic.  HENT:     Head: Normocephalic and atraumatic.     Right Ear: Tympanic membrane, ear canal and external ear normal. There is no  impacted cerumen.     Left Ear: Tympanic membrane, ear canal and external ear normal. There is no impacted cerumen.     Nose: Nose normal. No congestion or rhinorrhea.     Mouth/Throat:     Mouth: Mucous membranes are moist.     Pharynx: Oropharynx is clear. No oropharyngeal exudate or posterior oropharyngeal erythema.  Eyes:     General: No scleral icterus.       Right eye: No discharge.        Left eye: No discharge.     Extraocular Movements: Extraocular movements intact.     Conjunctiva/sclera: Conjunctivae normal.     Pupils: Pupils are equal, round, and reactive to light.  Neck:     Vascular: No carotid bruit.  Cardiovascular:     Rate and Rhythm: Normal rate and regular rhythm.     Pulses: Normal pulses.     Heart sounds: No murmur heard.    No friction rub. No gallop.  Pulmonary:     Effort: Pulmonary effort is normal. No respiratory distress.     Breath sounds: Normal breath sounds. No stridor. No wheezing, rhonchi or rales.  Chest:     Chest wall: No tenderness.  Abdominal:     General: Abdomen is flat. Bowel sounds are normal. There is no distension.     Palpations: Abdomen is soft. There is no mass.     Tenderness: There is no abdominal tenderness. There is no right CVA tenderness, left CVA tenderness, guarding or rebound.     Hernia: No hernia is present.  Genitourinary:    Comments: Genital exam deferred with shared decision making Musculoskeletal:        General: No swelling, tenderness, deformity or signs of injury.     Cervical back: Normal range of motion and neck supple. No rigidity. No muscular tenderness.     Right lower leg: No edema.     Left lower leg: No edema.  Lymphadenopathy:     Cervical: No cervical adenopathy.  Skin:    General: Skin is warm and dry.     Capillary Refill: Capillary refill takes less than  2 seconds.     Coloration: Skin is not jaundiced or pale.     Findings: No bruising, erythema, lesion or rash.  Neurological:     General: No focal deficit present.     Mental Status: He is alert and oriented to person, place, and time.     Cranial Nerves: No cranial nerve deficit.     Sensory: No sensory deficit.     Motor: No weakness.     Coordination: Coordination normal.     Gait: Gait normal.     Deep Tendon Reflexes: Reflexes normal.  Psychiatric:        Mood and Affect: Mood normal.        Behavior: Behavior normal.        Thought Content: Thought content normal.        Judgment: Judgment normal.     Results for orders placed or performed in visit on 03/18/22  Comprehensive metabolic panel  Result Value Ref Range   Glucose 119 (H) 70 - 99 mg/dL   BUN 13 6 - 24 mg/dL   Creatinine, Ser 1.61 0.76 - 1.27 mg/dL   eGFR 93 >09 UE/AVW/0.98   BUN/Creatinine Ratio 13 9 - 20   Sodium 141 134 - 144 mmol/L   Potassium 3.9 3.5 - 5.2 mmol/L   Chloride 103 96 - 106  mmol/L   CO2 19 (L) 20 - 29 mmol/L   Calcium 9.4 8.7 - 10.2 mg/dL   Total Protein 7.2 6.0 - 8.5 g/dL   Albumin 4.6 4.1 - 5.1 g/dL   Globulin, Total 2.6 1.5 - 4.5 g/dL   Albumin/Globulin Ratio 1.8 1.2 - 2.2   Bilirubin Total 0.5 0.0 - 1.2 mg/dL   Alkaline Phosphatase 120 44 - 121 IU/L   AST 22 0 - 40 IU/L   ALT 46 (H) 0 - 44 IU/L  CBC with Differential/Platelet  Result Value Ref Range   WBC 7.9 3.4 - 10.8 x10E3/uL   RBC 5.21 4.14 - 5.80 x10E6/uL   Hemoglobin 14.7 13.0 - 17.7 g/dL   Hematocrit 47.8 29.5 - 51.0 %   MCV 80 79 - 97 fL   MCH 28.2 26.6 - 33.0 pg   MCHC 35.1 31.5 - 35.7 g/dL   RDW 62.1 30.8 - 65.7 %   Platelets 256 150 - 450 x10E3/uL   Neutrophils 53 Not Estab. %   Lymphs 30 Not Estab. %   Monocytes 8 Not Estab. %   Eos 8 Not Estab. %   Basos 1 Not Estab. %   Neutrophils Absolute 4.2 1.4 - 7.0 x10E3/uL   Lymphocytes Absolute 2.4 0.7 - 3.1 x10E3/uL   Monocytes Absolute 0.6 0.1 - 0.9 x10E3/uL    EOS (ABSOLUTE) 0.6 (H) 0.0 - 0.4 x10E3/uL   Basophils Absolute 0.1 0.0 - 0.2 x10E3/uL   Immature Granulocytes 0 Not Estab. %   Immature Grans (Abs) 0.0 0.0 - 0.1 x10E3/uL  Lipid Panel w/o Chol/HDL Ratio  Result Value Ref Range   Cholesterol, Total 206 (H) 100 - 199 mg/dL   Triglycerides 846 (H) 0 - 149 mg/dL   HDL 28 (L) >96 mg/dL   VLDL Cholesterol Cal 66 (H) 5 - 40 mg/dL   LDL Chol Calc (NIH) 295 (H) 0 - 99 mg/dL  TSH  Result Value Ref Range   TSH 1.750 0.450 - 4.500 uIU/mL  Urinalysis, Routine w reflex microscopic  Result Value Ref Range   Specific Gravity, UA >1.030 (H) 1.005 - 1.030   pH, UA 5.5 5.0 - 7.5   Color, UA Yellow Yellow   Appearance Ur Clear Clear   Leukocytes,UA Negative Negative   Protein,UA Negative Negative/Trace   Glucose, UA Negative Negative   Ketones, UA Negative Negative   RBC, UA Negative Negative   Bilirubin, UA Negative Negative   Urobilinogen, Ur 0.2 0.2 - 1.0 mg/dL   Nitrite, UA Negative Negative      Assessment & Plan:   Problem List Items Addressed This Visit       Other   Anxiety    Under good control on current regimen. Continue current regimen. Continue to monitor. Call with any concerns. Refills given.        Relevant Medications   citalopram (CELEXA) 20 MG tablet   Depression, recurrent (HCC)    Under good control on current regimen. Continue current regimen. Continue to monitor. Call with any concerns. Refills given.        Relevant Medications   citalopram (CELEXA) 20 MG tablet   Other Visit Diagnoses     Routine general medical examination at a health care facility    -  Primary   Vaccines up to date. Screening labs checked today. Continue diet and exercise. Call with any concerns.   Relevant Orders   Comprehensive metabolic panel   CBC with Differential/Platelet   Lipid Panel w/o Chol/HDL  Ratio   PSA   TSH   Hepatitis C Antibody        LABORATORY TESTING:  Health maintenance labs ordered today as discussed  above.   The natural history of prostate cancer and ongoing controversy regarding screening and potential treatment outcomes of prostate cancer has been discussed with the patient. The meaning of a false positive PSA and a false negative PSA has been discussed. He indicates understanding of the limitations of this screening test and wishes to proceed with screening PSA testing.   IMMUNIZATIONS:   - Tdap: Tetanus vaccination status reviewed: last tetanus booster within 10 years. - Influenza: Up to date - Pneumovax: Not applicable - Prevnar: Not applicable - COVID: Refused - HPV: Not applicable - Shingrix vaccine: Not applicable  SCREENING: - Colonoscopy: Not applicable  Discussed with patient purpose of the colonoscopy is to detect colon cancer at curable precancerous or early stages    PATIENT COUNSELING:    Sexuality: Discussed sexually transmitted diseases, partner selection, use of condoms, avoidance of unintended pregnancy  and contraceptive alternatives.   Advised to avoid cigarette smoking.  I discussed with the patient that most people either abstain from alcohol or drink within safe limits (<=14/week and <=4 drinks/occasion for males, <=7/weeks and <= 3 drinks/occasion for females) and that the risk for alcohol disorders and other health effects rises proportionally with the number of drinks per week and how often a drinker exceeds daily limits.  Discussed cessation/primary prevention of drug use and availability of treatment for abuse.   Diet: Encouraged to adjust caloric intake to maintain  or achieve ideal body weight, to reduce intake of dietary saturated fat and total fat, to limit sodium intake by avoiding high sodium foods and not adding table salt, and to maintain adequate dietary potassium and calcium preferably from fresh fruits, vegetables, and low-fat dairy products.    stressed the importance of regular exercise  Injury prevention: Discussed safety belts, safety  helmets, smoke detector, smoking near bedding or upholstery.   Dental health: Discussed importance of regular tooth brushing, flossing, and dental visits.   Follow up plan: NEXT PREVENTATIVE PHYSICAL DUE IN 1 YEAR. Return in about 6 months (around 09/19/2023).

## 2023-03-22 LAB — COMPREHENSIVE METABOLIC PANEL
ALT: 22 [IU]/L (ref 0–44)
AST: 17 [IU]/L (ref 0–40)
Albumin: 4.5 g/dL (ref 4.1–5.1)
Alkaline Phosphatase: 131 [IU]/L — ABNORMAL HIGH (ref 44–121)
BUN/Creatinine Ratio: 9 (ref 9–20)
BUN: 9 mg/dL (ref 6–24)
Bilirubin Total: 0.4 mg/dL (ref 0.0–1.2)
CO2: 21 mmol/L (ref 20–29)
Calcium: 9.5 mg/dL (ref 8.7–10.2)
Chloride: 107 mmol/L — ABNORMAL HIGH (ref 96–106)
Creatinine, Ser: 1.05 mg/dL (ref 0.76–1.27)
Globulin, Total: 2.5 g/dL (ref 1.5–4.5)
Glucose: 97 mg/dL (ref 70–99)
Potassium: 3.9 mmol/L (ref 3.5–5.2)
Sodium: 138 mmol/L (ref 134–144)
Total Protein: 7 g/dL (ref 6.0–8.5)
eGFR: 91 mL/min/{1.73_m2} (ref >59)

## 2023-03-22 LAB — HEPATITIS C ANTIBODY: Hep C Virus Ab: NONREACTIVE

## 2023-03-22 LAB — CBC WITH DIFFERENTIAL/PLATELET
Basophils Absolute: 0.1 10*3/uL (ref 0.0–0.2)
Basos: 1 %
EOS (ABSOLUTE): 0.6 10*3/uL — ABNORMAL HIGH (ref 0.0–0.4)
Eos: 11 %
Hematocrit: 44 % (ref 37.5–51.0)
Hemoglobin: 14.8 g/dL (ref 13.0–17.7)
Immature Grans (Abs): 0 10*3/uL (ref 0.0–0.1)
Immature Granulocytes: 0 %
Lymphocytes Absolute: 1.6 10*3/uL (ref 0.7–3.1)
Lymphs: 26 %
MCH: 27.5 pg (ref 26.6–33.0)
MCHC: 33.6 g/dL (ref 31.5–35.7)
MCV: 82 fL (ref 79–97)
Monocytes Absolute: 0.5 10*3/uL (ref 0.1–0.9)
Monocytes: 9 %
Neutrophils Absolute: 3.3 10*3/uL (ref 1.4–7.0)
Neutrophils: 53 %
Platelets: 214 10*3/uL (ref 150–450)
RBC: 5.39 x10E6/uL (ref 4.14–5.80)
RDW: 15.9 % — ABNORMAL HIGH (ref 11.6–15.4)
WBC: 6.1 10*3/uL (ref 3.4–10.8)

## 2023-03-22 LAB — LIPID PANEL W/O CHOL/HDL RATIO
Cholesterol, Total: 228 mg/dL — ABNORMAL HIGH (ref 100–199)
HDL: 31 mg/dL — ABNORMAL LOW (ref >39)
LDL Chol Calc (NIH): 98 mg/dL (ref 0–99)
Triglycerides: 584 mg/dL (ref 0–149)
VLDL Cholesterol Cal: 99 mg/dL — ABNORMAL HIGH (ref 5–40)

## 2023-03-22 LAB — PSA: Prostate Specific Ag, Serum: 0.3 ng/mL (ref 0.0–4.0)

## 2023-03-22 LAB — TSH: TSH: 1.71 u[IU]/mL (ref 0.450–4.500)

## 2023-03-26 ENCOUNTER — Encounter: Payer: Self-pay | Admitting: Family Medicine

## 2023-04-04 MED ORDER — OMEPRAZOLE 20 MG PO CPDR: 20.0000 mg | DELAYED_RELEASE_CAPSULE | Freq: Every day | ORAL | 1 refills | Status: DC

## 2023-04-04 MED ORDER — ATORVASTATIN CALCIUM 40 MG PO TABS: 40.0000 mg | ORAL_TABLET | Freq: Every day | ORAL | 1 refills | Status: DC

## 2023-05-18 ENCOUNTER — Encounter: Payer: Self-pay | Admitting: Family Medicine

## 2023-05-19 MED ORDER — NICOTINE 21 MG/24HR TD PT24: 21.0000 mg | MEDICATED_PATCH | Freq: Every day | TRANSDERMAL | 0 refills | Status: DC

## 2023-05-19 MED ORDER — NICOTINE 14 MG/24HR TD PT24: 14.0000 mg | MEDICATED_PATCH | Freq: Every day | TRANSDERMAL | 0 refills | Status: DC

## 2023-08-07 ENCOUNTER — Encounter: Payer: Self-pay | Admitting: Family Medicine

## 2023-08-07 ENCOUNTER — Other Ambulatory Visit: Payer: Self-pay

## 2023-08-07 ENCOUNTER — Inpatient Hospital Stay
Admission: EM | Admit: 2023-08-07 | Discharge: 2023-08-08 | DRG: 683 | Disposition: A | Discharge status: Home / Self Care | Payer: PRIVATE HEALTH INSURANCE | Attending: Internal Medicine | Admitting: Internal Medicine

## 2023-08-07 ENCOUNTER — Emergency Department: Payer: PRIVATE HEALTH INSURANCE

## 2023-08-07 DIAGNOSIS — R933 Abnormal findings on diagnostic imaging of other parts of digestive tract: Secondary | ICD-10-CM

## 2023-08-07 DIAGNOSIS — K219 Gastro-esophageal reflux disease without esophagitis: Secondary | ICD-10-CM | POA: Insufficient documentation

## 2023-08-07 DIAGNOSIS — F17210 Nicotine dependence, cigarettes, uncomplicated: Secondary | ICD-10-CM | POA: Diagnosis present

## 2023-08-07 DIAGNOSIS — Z888 Allergy status to other drugs, medicaments and biological substances status: Secondary | ICD-10-CM | POA: Diagnosis not present

## 2023-08-07 DIAGNOSIS — E8721 Acute metabolic acidosis: Secondary | ICD-10-CM | POA: Insufficient documentation

## 2023-08-07 DIAGNOSIS — F32A Depression, unspecified: Secondary | ICD-10-CM | POA: Diagnosis present

## 2023-08-07 DIAGNOSIS — K6389 Other specified diseases of intestine: Secondary | ICD-10-CM

## 2023-08-07 DIAGNOSIS — F419 Anxiety disorder, unspecified: Secondary | ICD-10-CM | POA: Insufficient documentation

## 2023-08-07 DIAGNOSIS — E876 Hypokalemia: Secondary | ICD-10-CM | POA: Diagnosis present

## 2023-08-07 DIAGNOSIS — Z1152 Encounter for screening for COVID-19: Secondary | ICD-10-CM | POA: Diagnosis not present

## 2023-08-07 DIAGNOSIS — K2900 Acute gastritis without bleeding: Secondary | ICD-10-CM | POA: Diagnosis present

## 2023-08-07 DIAGNOSIS — D122 Benign neoplasm of ascending colon: Secondary | ICD-10-CM | POA: Diagnosis present

## 2023-08-07 DIAGNOSIS — E785 Hyperlipidemia, unspecified: Secondary | ICD-10-CM | POA: Diagnosis present

## 2023-08-07 DIAGNOSIS — E66811 Obesity, class 1: Secondary | ICD-10-CM | POA: Diagnosis present

## 2023-08-07 DIAGNOSIS — R112 Nausea with vomiting, unspecified: Secondary | ICD-10-CM

## 2023-08-07 DIAGNOSIS — E860 Dehydration: Secondary | ICD-10-CM | POA: Diagnosis present

## 2023-08-07 DIAGNOSIS — Z79899 Other long term (current) drug therapy: Secondary | ICD-10-CM | POA: Diagnosis not present

## 2023-08-07 DIAGNOSIS — Z8249 Family history of ischemic heart disease and other diseases of the circulatory system: Secondary | ICD-10-CM | POA: Diagnosis not present

## 2023-08-07 DIAGNOSIS — Z833 Family history of diabetes mellitus: Secondary | ICD-10-CM

## 2023-08-07 DIAGNOSIS — N179 Acute kidney failure, unspecified: Secondary | ICD-10-CM | POA: Diagnosis present

## 2023-08-07 DIAGNOSIS — K529 Noninfective gastroenteritis and colitis, unspecified: Secondary | ICD-10-CM | POA: Diagnosis present

## 2023-08-07 DIAGNOSIS — Z8711 Personal history of peptic ulcer disease: Secondary | ICD-10-CM | POA: Diagnosis not present

## 2023-08-07 DIAGNOSIS — I10 Essential (primary) hypertension: Secondary | ICD-10-CM | POA: Diagnosis present

## 2023-08-07 DIAGNOSIS — Z6830 Body mass index (BMI) 30.0-30.9, adult: Secondary | ICD-10-CM | POA: Diagnosis not present

## 2023-08-07 LAB — CBC WITH DIFFERENTIAL/PLATELET
Abs Immature Granulocytes: 0.08 10*3/uL — ABNORMAL HIGH (ref 0.00–0.07)
Basophils Absolute: 0.1 10*3/uL (ref 0.0–0.1)
Basophils Relative: 1 %
Eosinophils Absolute: 0.5 10*3/uL (ref 0.0–0.5)
Eosinophils Relative: 4 %
HCT: 52.2 % — ABNORMAL HIGH (ref 39.0–52.0)
Hemoglobin: 18 g/dL — ABNORMAL HIGH (ref 13.0–17.0)
Immature Granulocytes: 1 %
Lymphocytes Relative: 5 %
Lymphs Abs: 0.7 10*3/uL (ref 0.7–4.0)
MCH: 27.6 pg (ref 26.0–34.0)
MCHC: 34.5 g/dL (ref 30.0–36.0)
MCV: 80.1 fL (ref 80.0–100.0)
Monocytes Absolute: 0.5 10*3/uL (ref 0.1–1.0)
Monocytes Relative: 4 %
Neutro Abs: 13 10*3/uL — ABNORMAL HIGH (ref 1.7–7.7)
Neutrophils Relative %: 85 %
Platelets: 259 10*3/uL (ref 150–400)
RBC: 6.52 MIL/uL — ABNORMAL HIGH (ref 4.22–5.81)
RDW: 13.8 % (ref 11.5–15.5)
WBC: 14.9 10*3/uL — ABNORMAL HIGH (ref 4.0–10.5)
nRBC: 0 % (ref 0.0–0.2)

## 2023-08-07 LAB — BASIC METABOLIC PANEL
Anion gap: 14 (ref 5–15)
BUN: 15 mg/dL (ref 6–20)
CO2: 22 mmol/L (ref 22–32)
Calcium: 9.7 mg/dL (ref 8.9–10.3)
Chloride: 103 mmol/L (ref 98–111)
Creatinine, Ser: 1.39 mg/dL — ABNORMAL HIGH (ref 0.61–1.24)
GFR, Estimated: >60 mL/min (ref >60)
Glucose, Bld: 143 mg/dL — ABNORMAL HIGH (ref 70–99)
Potassium: 3.8 mmol/L (ref 3.5–5.1)
Sodium: 139 mmol/L (ref 135–145)

## 2023-08-07 LAB — URINALYSIS, W/ REFLEX TO CULTURE (INFECTION SUSPECTED)
Bacteria, UA: NONE SEEN
Bilirubin Urine: NEGATIVE
Glucose, UA: NEGATIVE mg/dL
Hgb urine dipstick: NEGATIVE
Ketones, ur: NEGATIVE mg/dL
Leukocytes,Ua: NEGATIVE
Nitrite: NEGATIVE
Protein, ur: NEGATIVE mg/dL
Specific Gravity, Urine: >1.046 — ABNORMAL HIGH (ref 1.005–1.030)
pH: 5 (ref 5.0–8.0)

## 2023-08-07 LAB — HIV ANTIBODY (ROUTINE TESTING W REFLEX): HIV Screen 4th Generation wRfx: NONREACTIVE

## 2023-08-07 LAB — COMPREHENSIVE METABOLIC PANEL
ALT: 46 U/L — ABNORMAL HIGH (ref 0–44)
AST: 32 U/L (ref 15–41)
Albumin: 5.3 g/dL — ABNORMAL HIGH (ref 3.5–5.0)
Alkaline Phosphatase: 116 U/L (ref 38–126)
Anion gap: 18 — ABNORMAL HIGH (ref 5–15)
BUN: 14 mg/dL (ref 6–20)
CO2: 16 mmol/L — ABNORMAL LOW (ref 22–32)
Calcium: 10.4 mg/dL — ABNORMAL HIGH (ref 8.9–10.3)
Chloride: 102 mmol/L (ref 98–111)
Creatinine, Ser: 1.68 mg/dL — ABNORMAL HIGH (ref 0.61–1.24)
GFR, Estimated: 52 mL/min — ABNORMAL LOW (ref >60)
Glucose, Bld: 208 mg/dL — ABNORMAL HIGH (ref 70–99)
Potassium: 3.2 mmol/L — ABNORMAL LOW (ref 3.5–5.1)
Sodium: 136 mmol/L (ref 135–145)
Total Bilirubin: 1.5 mg/dL — ABNORMAL HIGH (ref 0.0–1.2)
Total Protein: 9.4 g/dL — ABNORMAL HIGH (ref 6.5–8.1)

## 2023-08-07 LAB — CBC
HCT: 46.4 % (ref 39.0–52.0)
Hemoglobin: 15.7 g/dL (ref 13.0–17.0)
MCH: 27.9 pg (ref 26.0–34.0)
MCHC: 33.8 g/dL (ref 30.0–36.0)
MCV: 82.6 fL (ref 80.0–100.0)
Platelets: 190 10*3/uL (ref 150–400)
RBC: 5.62 MIL/uL (ref 4.22–5.81)
RDW: 13.8 % (ref 11.5–15.5)
WBC: 12.6 10*3/uL — ABNORMAL HIGH (ref 4.0–10.5)
nRBC: 0 % (ref 0.0–0.2)

## 2023-08-07 LAB — RESP PANEL BY RT-PCR (RSV, FLU A&B, COVID)  RVPGX2
Influenza A by PCR: NEGATIVE
Influenza B by PCR: NEGATIVE
Resp Syncytial Virus by PCR: NEGATIVE
SARS Coronavirus 2 by RT PCR: NEGATIVE

## 2023-08-07 MED ORDER — NA SULFATE-K SULFATE-MG SULF 17.5-3.13-1.6 GM/177ML PO SOLN
0.5000 | Freq: Once | ORAL | Status: DC
  Filled 2023-08-07: qty 1

## 2023-08-07 MED ORDER — POTASSIUM CHLORIDE IN NACL 20-0.9 MEQ/L-% IV SOLN
INTRAVENOUS | Status: AC
  Filled 2023-08-07 (×4): qty 1000

## 2023-08-07 MED ORDER — ATORVASTATIN CALCIUM 20 MG PO TABS
40.0000 mg | ORAL_TABLET | Freq: Every day | ORAL | Status: DC
  Administered 2023-08-07: 40 mg via ORAL
  Filled 2023-08-07: qty 2

## 2023-08-07 MED ORDER — LACTATED RINGERS IV BOLUS
1000.0000 mL | Freq: Once | INTRAVENOUS | Status: AC
  Administered 2023-08-07: 1000 mL via INTRAVENOUS

## 2023-08-07 MED ORDER — ONDANSETRON HCL 4 MG/2ML IJ SOLN
4.0000 mg | Freq: Once | INTRAMUSCULAR | Status: AC
  Administered 2023-08-07: 4 mg via INTRAVENOUS
  Filled 2023-08-07: qty 2

## 2023-08-07 MED ORDER — CITALOPRAM HYDROBROMIDE 20 MG PO TABS
20.0000 mg | ORAL_TABLET | Freq: Every day | ORAL | Status: DC
  Administered 2023-08-07: 20 mg via ORAL
  Filled 2023-08-07: qty 1

## 2023-08-07 MED ORDER — TRAZODONE HCL 50 MG PO TABS: 25.0000 mg | ORAL_TABLET | Freq: Every evening | ORAL | Status: DC | PRN

## 2023-08-07 MED ORDER — ALBUMIN HUMAN 25 % IV SOLN
25.0000 g | Freq: Once | INTRAVENOUS | Status: AC
  Administered 2023-08-07: 25 g via INTRAVENOUS
  Filled 2023-08-07: qty 100

## 2023-08-07 MED ORDER — ONDANSETRON HCL 4 MG/2ML IJ SOLN
4.0000 mg | Freq: Four times a day (QID) | INTRAMUSCULAR | Status: DC | PRN
  Administered 2023-08-07: 4 mg via INTRAVENOUS
  Filled 2023-08-07: qty 2

## 2023-08-07 MED ORDER — NA SULFATE-K SULFATE-MG SULF 17.5-3.13-1.6 GM/177ML PO SOLN
0.5000 | Freq: Once | ORAL | Status: DC
Start: 2023-08-08 — End: 2023-08-07

## 2023-08-07 MED ORDER — PEG 3350-KCL-NA BICARB-NACL 420 G PO SOLR
4000.0000 mL | Freq: Once | ORAL | Status: AC
  Administered 2023-08-07: 4000 mL via ORAL
  Filled 2023-08-07: qty 4000

## 2023-08-07 MED ORDER — ONDANSETRON HCL 4 MG PO TABS: 4.0000 mg | ORAL_TABLET | Freq: Four times a day (QID) | ORAL | Status: DC | PRN

## 2023-08-07 MED ORDER — PANTOPRAZOLE SODIUM 40 MG IV SOLR
40.0000 mg | Freq: Two times a day (BID) | INTRAVENOUS | Status: DC
  Administered 2023-08-07 – 2023-08-08 (×3): 40 mg via INTRAVENOUS
  Filled 2023-08-07 (×3): qty 10

## 2023-08-07 MED ORDER — ACETAMINOPHEN 650 MG RE SUPP: 650.0000 mg | Freq: Four times a day (QID) | RECTAL | Status: DC | PRN

## 2023-08-07 MED ORDER — ACETAMINOPHEN 325 MG PO TABS: 650.0000 mg | ORAL_TABLET | Freq: Four times a day (QID) | ORAL | Status: DC | PRN

## 2023-08-07 MED ORDER — IOHEXOL 300 MG/ML  SOLN
100.0000 mL | Freq: Once | INTRAMUSCULAR | Status: AC | PRN
  Administered 2023-08-07: 100 mL via INTRAVENOUS

## 2023-08-07 MED ORDER — MAGNESIUM HYDROXIDE 400 MG/5ML PO SUSP: 30.0000 mL | Freq: Every day | ORAL | Status: DC | PRN

## 2023-08-07 NOTE — Assessment & Plan Note (Addendum)
-   GI consultation will be obtained. - I notified Dr. Allegra Lai about the patient. - General Surgery consult will be obtained. - I notified Dr. Everlene Farrier about the patient.

## 2023-08-07 NOTE — Assessment & Plan Note (Signed)
 -  We will continue statin therapy.

## 2023-08-07 NOTE — Consult Note (Signed)
 Rodney Minium, MD [facility]  [address], [address] [place], [place] [address] Phone: [phone] Fax : [phone]  Consultation  Referring Provider:     Dr. Arville Care Primary Care Physician:  [name], DO Primary Gastroenterologist:  Dr. Servando Snare         Reason for Consultation:     Abnormal CT scan of the abdomen  Date of Admission:  08/07/2023 Date of Consultation:  08/07/2023         HPI:   Rodney Myers is a 43 y.o. male who came in with signs and symptoms of gastroenteritis.  The patient reports that he had a in sushi with his wife and she had not gotten sick but he started to have vomiting with abdominal discomfort.  The patient has a history of GERD and had an upper endoscopy by me back in 2023.  The patient reported that the nausea vomiting and diarrhea all started yesterday with diffuse abdominal discomfort.  The patient was treated with Zofran in the ED and underwent a CT scan of the abdomen.  CT of the abdomen showed:  IMPRESSION: No CT findings to account for the patient's left lower quadrant abdominal pain.   4.7 cm enhancing polypoid soft tissue mass in the ascending colon, highly suspicious for colonic adenocarcinoma. GI consultation for colonoscopy is suggested.   No evidence of metastatic disease.  The patient states that he is feeling much better today.  He is not having any more vomiting.  He is tolerating a liquid diet.  There is no report of any severe abdominal pain he just has some mild residual pain.  He denies any family history of colon cancer.  He does report that his vomitus and stools were devoid of any sign of bleeding.  Past Medical History:  Diagnosis Date   Allergy    Anxiety    GERD (gastroesophageal reflux disease)    History of kidney stones    Hypertension     Past Surgical History:  Procedure Laterality Date   ESOPHAGOGASTRODUODENOSCOPY (EGD) WITH PROPOFOL N/A 11/04/2021   Procedure: ESOPHAGOGASTRODUODENOSCOPY (EGD) WITH PROPOFOL;   Surgeon: Rodney Minium, MD;  Location: [facility] ENDOSCOPY;  Service: Endoscopy;  Laterality: N/A;   ROOT CANAL  07/2014    Prior to Admission medications   Medication Sig Start Date End Date Taking? Authorizing Provider  atorvastatin (LIPITOR) 40 MG tablet Take 1 tablet (40 mg total) by mouth daily. 04/04/23   Johnson, Megan P, DO  citalopram (CELEXA) 20 MG tablet Take 1 tablet (20 mg total) by mouth daily. 03/21/23   Johnson, Megan P, DO  nicotine (NICODERM CQ) 14 mg/24hr patch Place 1 patch (14 mg total) onto the skin daily. 05/19/23   Johnson, Megan P, DO  nicotine (NICODERM CQ) 21 mg/24hr patch Place 1 patch (21 mg total) onto the skin daily. 05/19/23   Johnson, Megan P, DO  nicotine (NICODERM CQ) 7 mg/24hr patch Place 1 patch (7 mg total) onto the skin daily. 03/21/23   Johnson, Megan P, DO  omeprazole (PRILOSEC) 20 MG capsule Take 1-2 capsules (20-40 mg total) by mouth daily. 04/04/23   [name], DO    Family History  Problem Relation Age of Onset   Cancer Maternal Grandfather        nasal   Hypertension Mother    Other Father    Diabetes Maternal Grandmother    Heart disease Paternal Grandfather      Social History   Tobacco  Use   Smoking status: Former    Current packs/day: 0.00    Types: Cigarettes    Quit date: 2024    Years since quitting: 1.1   Smokeless tobacco: Never  Vaping Use   Vaping status: Former  Substance Use Topics   Alcohol use: Not Currently    Comment: pt states very rare   Drug use: Yes    Frequency: 12.0 times per week    Types: Marijuana    Comment: daily    Allergies as of 08/07/2023 - Review Complete 08/07/2023  Allergen Reaction Noted   Wellbutrin [bupropion] Other (See Comments) 08/13/2020    Review of Systems:    All systems reviewed and negative except where noted in HPI.   Physical Exam:  Vital signs in last 24 hours: Temp:  [97.6 F (36.4 C)-98.8 F (37.1 C)] 98.8 F (37.1 C) (02/24 0449) Pulse Rate:  [81-95] 94 (02/24  0449) Resp:  [12-22] 20 (02/24 0449) BP: (117-137)/(70-106) 137/88 (02/24 0449) SpO2:  [97 %-100 %] 99 % (02/24 0449) Weight:  [83.9 kg] 83.9 kg (02/24 0108) Last BM Date : 08/07/23 General:   Pleasant, cooperative in NAD Head:  Normocephalic and atraumatic. Eyes:   No icterus.   Conjunctiva pink. PERRLA. Ears:  Normal auditory acuity. Abdomen:  Soft, nondistended, nontender. Normal bowel sounds. No appreciable masses or hepatomegaly.  No rebound or guarding.  Rectal:  Not performed. Msk:  Symmetrical without gross deformities.    Extremities:  Without edema, cyanosis or clubbing. Neurologic:  Alert and oriented x3;  grossly normal neurologically. Skin:  Intact without significant lesions or rashes. Cervical Nodes:  No significant cervical adenopathy. Psych:  Alert and cooperative. Normal affect.  LAB RESULTS: Recent Labs    08/07/23 0113 08/07/23 0518  WBC 14.9* 12.6*  HGB 18.0* 15.7  HCT 52.2* 46.4  PLT 259 190   BMET Recent Labs    08/07/23 0113 08/07/23 0518  NA 136 139  K 3.2* 3.8  CL 102 103  CO2 16* 22  GLUCOSE 208* 143*  BUN 14 15  CREATININE 1.68* 1.39*  CALCIUM 10.4* 9.7   LFT Recent Labs    08/07/23 0113  PROT 9.4*  ALBUMIN 5.3*  AST 32  ALT 46*  ALKPHOS 116  BILITOT 1.5*   PT/INR No results for input(s): "LABPROT", "INR" in the last 72 hours.  STUDIES: CT ABDOMEN PELVIS W CONTRAST Result Date: 08/07/2023 CLINICAL DATA:  Left lower quadrant abdominal pain, vomiting, diarrhea EXAM: CT ABDOMEN AND PELVIS WITH CONTRAST TECHNIQUE: Multidetector CT imaging of the abdomen and pelvis was performed using the standard protocol following bolus administration of intravenous contrast. RADIATION DOSE REDUCTION: This exam was performed according to the departmental dose-optimization program which includes automated exposure control, adjustment of the mA and/or kV according to patient size and/or use of iterative reconstruction technique. CONTRAST:   OMNIPAQUE IOHEXOL 300 MG/ML  SOLN COMPARISON:  03/30/2019 FINDINGS: Lower chest: Lung bases are clear. Hepatobiliary: Subcentimeter cyst in segment 4 (series 2/image 19), benign. Gallbladder is unremarkable. No intrahepatic or extrahepatic ductal dilatation. Pancreas: Within normal limits. Spleen: Within normal limits. Adrenals/Urinary Tract: Adrenal glands are within normal limits. Kidneys are within normal limits.  No hydronephrosis. Mildly thick-walled bladder, although underdistended. Stomach/Bowel: Stomach is within normal limits. No evidence of bowel obstruction. Normal appendix (series 2/image 55). 3.5 x 4.7 x 4.1 cm enhancing polypoid soft tissue mass in the ascending colon (coronal image 50), highly suspicious for colonic adenocarcinoma. No pericolonic extension. Vascular/Lymphatic: No evidence  of abdominal aortic aneurysm. No suspicious abdominopelvic lymphadenopathy. Specifically, no suspicious ileocolic nodes. Reproductive: Prostate is unremarkable. Other: No abdominopelvic ascites. Musculoskeletal: Visualized osseous structures are within normal limits. IMPRESSION: No CT findings to account for the patient's left lower quadrant abdominal pain. 4.7 cm enhancing polypoid soft tissue mass in the ascending colon, highly suspicious for colonic adenocarcinoma. GI consultation for colonoscopy is suggested. No evidence of metastatic disease. Electronically Signed   By: Charline Bills M.D.   On: 08/07/2023 02:28      Impression / Plan:   Assessment: Principal Problem:   AKI (acute kidney injury) (HCC) Active Problems:   Acute gastroenteritis   Colonic mass   Dyslipidemia   GERD without esophagitis   Anxiety and depression   Hypokalemia   Rodney Myers is a 43 y.o. y/o male with what appears to be admission for gastroenteritis with a CT scan showing a lesion in his ascending colon suspicious of adenocarcinoma.  It was recommended that the patient be seen by gastroenterology for a possible  colonoscopy.  The patient has not had any unexplained weight loss rectal bleeding or anemia.  The patient has been informed of these findings.  Plan:  The patient will be set up for a colonoscopy for tomorrow.  The patient will be given a prep today and be put on a clear liquid diet today.  The patient has been told to drink his prep slowly and if he has any nausea to slow down so that he does not vomit the prep.  The patient and his family have been explained the plan and agree with it.   Thank you for involving me in the care of this patient.      LOS: 0 days   Rodney Minium, MD, MD. [name] 08/07/2023, 7:51 AM,  Pager [phone] 7am-5pm  Check AMION for 5pm -7am coverage and on weekends   Note: This dictation was prepared with Dragon dictation along with smaller phrase technology. Any transcriptional errors that result from this process are unintentional.

## 2023-08-07 NOTE — Consult Note (Signed)
 Patient ID: Rodney Myers, male   DOB: 01/10/1981, 43 y.o.   MRN: [id number]  HPI Rodney Myers is a 43 y.o. male seen in consultation at the request of Dr.Wohl, case discussed with him in detail.  He came in early this morning complaining of nausea vomiting diarrhea and abdominal pain.  He reports that the pain is diffuse, intermittent moderate intensity and colicky.  No specific alleviating or aggravating factors.  Patient had sushi last night and apparently 3 hours after that symptoms started. CT scan was performed which I personally reviewed showing evidence of right colon mass.  There is no evidence of metastatic disease this is likely serendipitous. He denies any weight loss any melena any hematochezia k of 3.2, CO2 16, glucose of 208, BUN of 40 with creatinine 1.68 up from 1.05 on 03/21/2023.  Albumin was 5.3 with total protein of 9.4 and ALT 46 with total bili 1.5.  CBC showed leukocytosis of 14.9 with neutrophilia and hemoconcentration.   He is able to perform more than 4 METS of activity without any shortness of or chest pain.  He was able to tolerate clear liquids. Family history is negative for colon cancer HPI  Past Medical History:  Diagnosis Date   Allergy    Anxiety    GERD (gastroesophageal reflux disease)    History of kidney stones    Hypertension     Past Surgical History:  Procedure Laterality Date   ESOPHAGOGASTRODUODENOSCOPY (EGD) WITH PROPOFOL N/A 11/04/2021   Procedure: ESOPHAGOGASTRODUODENOSCOPY (EGD) WITH PROPOFOL;  Surgeon: [name], MD;  Location: [facility] ENDOSCOPY;  Service: Endoscopy;  Laterality: N/A;   ROOT CANAL  07/2014    Family History  Problem Relation Age of Onset   Cancer Maternal Grandfather        nasal   Hypertension Mother    Other Father    Diabetes Maternal Grandmother    Heart disease Paternal Grandfather     Social History Social History   Tobacco Use   Smoking status: Former    Current packs/day: 0.00    Types:  Cigarettes    Quit date: 2024    Years since quitting: 1.1   Smokeless tobacco: Never  Vaping Use   Vaping status: Former  Substance Use Topics   Alcohol use: Not Currently    Comment: pt states very rare   Drug use: Yes    Frequency: 12.0 times per week    Types: Marijuana    Comment: daily    Allergies  Allergen Reactions   Wellbutrin [Bupropion] Other (See Comments)    irritablity    Current Facility-Administered Medications  Medication Dose Route Frequency Provider Last Rate Last Admin   0.9 % NaCl with KCl 20 mEq/ L  infusion   Intravenous Continuous Thelonious Kauffmann F, MD 100 mL/hr at 08/07/23 0507 New Bag at 08/07/23 0507   acetaminophen (TYLENOL) tablet 650 mg  650 mg Oral Q6H PRN Mansy, Jan A, MD       Or   acetaminophen (TYLENOL) suppository 650 mg  650 mg Rectal Q6H PRN Mansy, Jan A, MD       albumin human 25 % solution 25 g  25 g Intravenous Once Tadeo Besecker, [place] F, MD       atorvastatin (LIPITOR) tablet 40 mg  40 mg Oral Daily Mansy, Jan A, MD   40 mg at 08/07/23 1020   citalopram (CELEXA) tablet 20 mg  20 mg Oral Daily Mansy, Jan A, MD   20 mg  at 08/07/23 1020   magnesium hydroxide (MILK OF MAGNESIA) suspension 30 mL  30 mL Oral Daily PRN Mansy, Jan A, MD       ondansetron [facility]) tablet 4 mg  4 mg Oral Q6H PRN Mansy, Jan A, MD       Or   ondansetron [facility]) injection 4 mg  4 mg Intravenous Q6H PRN Mansy, Jan A, MD       pantoprazole (PROTONIX) injection 40 mg  40 mg Intravenous Q12H Mansy, Jan A, MD   40 mg at 08/07/23 1020   traZODone (DESYREL) tablet 25 mg  25 mg Oral QHS PRN Mansy, Vernetta Honey, MD         Review of Systems Full ROS  was asked and was negative except for the information on the HPI  Physical Exam Blood pressure 117/72, pulse 99, temperature 98.4 F (36.9 C), temperature source Oral, resp. rate 14, height 5\' 5"  (1.651 m), weight 83.9 kg, SpO2 97%. CONSTITUTIONAL: NAD. EYES: Pupils are equal, round,  Sclera are non-icteric. EARS, NOSE, MOUTH AND  THROAT: The oropharynx is clear. The oral mucosa is pink and moist. Hearing is intact to voice. LYMPH NODES:  Lymph nodes in the neck are normal. RESPIRATORY:  Lungs are clear. There is normal respiratory effort, with equal breath sounds bilaterally, and without pathologic use of accessory muscles. CARDIOVASCULAR: Heart is regular without murmurs, gallops, or rubs. GI: The abdomen is  soft, nontender, and nondistended. There are no palpable masses. There is no hepatosplenomegaly. There are normal bowel sounds in all quadrants. GU: Rectal deferred.   MUSCULOSKELETAL: Normal muscle strength and tone. No cyanosis or edema.   SKIN: Turgor is good and there are no pathologic skin lesions or ulcers. NEUROLOGIC: Motor and sensation is grossly normal. Cranial nerves are grossly intact. PSYCH:  Oriented to person, place and time. Affect is normal.  Data Reviewed  I have personally reviewed the patient's imaging, laboratory findings and medical records.    Assessment/Plan 43 year old male with symptoms consistent with enteritis likely viral.  In addition to this workup revealed evidence of colon mass.  I agree with further workup including colonoscopy for tissue biopsy. He Does not have any signs or symptoms of obstruction or active bleeding.  I discussed with the patient and the father in detail about his disease process and if these were a true colon mass that he will need a colectomy.  We can certainly see him as an outpatient to further work him up.  I will order a CEA for tomorrow. All questions were answered. He did talk about what potentially minimally invasive right colectomy will entail.  The risk-benefit and possible medications.  He is Open to further discussions regarding surgical intervention. He knows that we can schedule this electively as outpt.    Sterling Big, MD FACS General Surgeon 08/07/2023, 1:09 PM

## 2023-08-07 NOTE — Assessment & Plan Note (Signed)
-   We will continue PPI therapy as mentioned above.

## 2023-08-07 NOTE — Progress Notes (Signed)
 Patient is seen and examined today morning.  He is admitted for evaluation abdominal discomfort.  CT abdomen showed 4.7 cm enhancing soft tissue mass in ascending colon.  GI plan to do colonoscopy tomorrow.  Patient's pain is better today, no nausea or vomiting.  Able to tolerate clears.    Continue pain control, IV Zofran, gentle IV fluids.  His kidney function improving.  Further management as per findings of colonoscopy, clinical course.  Discussed the plan with patient and family at bedside.  They understand and agree.

## 2023-08-07 NOTE — Plan of Care (Signed)

## 2023-08-07 NOTE — H&P (Signed)
 [place]   PATIENT NAME: Rodney Myers    MR#:  [id number]  DATE OF BIRTH:  1981-02-19  DATE OF ADMISSION:  08/07/2023  PRIMARY CARE PHYSICIAN: [name], DO   Patient is coming from: Home  REQUESTING/REFERRING PHYSICIAN: Claybon Jabs, MD    CHIEF COMPLAINT:   Chief Complaint  Patient presents with   Emesis   Diarrhea    HISTORY OF PRESENT ILLNESS:  Rodney Myers is a 43 y.o. male with medical history significant for anxiety, GERD, essential hypertension and urolithiasis, who presented to the emergency room with acute onset of intractable nausea with vomiting as well as diarrhea that started yesterday.  He admits to associated abdominal pain that started few hours before he came to the ER.  This happened after eating sushi.  His wife ate similar sushi however without any symptoms.  He admitted to right lower back pain which got him concerned about kidney stones.  He admitted to tactile fever or chills.  No bilious vomitus or hematemesis.  No dysuria, oliguria or hematuria or flank pain.  No chest pain or palpitations.  No cough or wheezing or dyspnea.  ED Course: When he came to the ER, vital signs were within normal.  Labs revealed alcohol kalemia of 3.2, CO2 16, glucose of 208, BUN of 40 with creatinine 1.68 up from 1.05 on 03/21/2023.  Albumin was 5.3 with total protein of 9.4 and ALT 46 with total bili 1.5.  CBC showed leukocytosis of 14.9 with neutrophilia and hemoconcentration.  Respiratory panel came back negative. EKG as reviewed by me : EKG showed EKG showed sinus rhythm with a rate of 83 with left atrial enlargement and RSR-in V1 and V2. Imaging: Abdominal and pelvic CT scan with contrast revealed the following: No CT findings to account for the patient's left lower quadrant abdominal pain.   4.7 cm enhancing polypoid soft tissue mass in the ascending colon, highly suspicious for colonic adenocarcinoma. GI consultation for colonoscopy is  suggested.  The patient was given 4 mg of IV Zofran and 2 L bolus of IV lactated ringer.  He will be admitted to a medical-surgical observation bed for further evaluation and management. PAST MEDICAL HISTORY:   Past Medical History:  Diagnosis Date   Allergy    Anxiety    GERD (gastroesophageal reflux disease)    History of kidney stones    Hypertension     PAST SURGICAL HISTORY:   Past Surgical History:  Procedure Laterality Date   ESOPHAGOGASTRODUODENOSCOPY (EGD) WITH PROPOFOL N/A 11/04/2021   Procedure: ESOPHAGOGASTRODUODENOSCOPY (EGD) WITH PROPOFOL;  Surgeon: [name], MD;  Location: [facility] ENDOSCOPY;  Service: Endoscopy;  Laterality: N/A;   ROOT CANAL  07/2014    SOCIAL HISTORY:   Social History   Tobacco Use   Smoking status: Former    Current packs/day: 1.00    Types: Cigarettes   Smokeless tobacco: Never  Substance Use Topics   Alcohol use: Not Currently    Comment: pt states very rare    FAMILY HISTORY:   Family History  Problem Relation Age of Onset   Cancer Maternal Grandfather        nasal   Hypertension Mother    Other Father    Diabetes Maternal Grandmother    Heart disease Paternal Grandfather     DRUG ALLERGIES:   Allergies  Allergen Reactions   Wellbutrin [Bupropion] Other (See Comments)    irritablity    REVIEW OF SYSTEMS:  ROS As per history of present illness. All pertinent systems were reviewed above. Constitutional, HEENT, cardiovascular, respiratory, GI, GU, musculoskeletal, neuro, psychiatric, endocrine, integumentary and hematologic systems were reviewed and are otherwise negative/unremarkable except for positive findings mentioned above in the HPI.   MEDICATIONS AT HOME:   Prior to Admission medications   Medication Sig Start Date End Date Taking? Authorizing Provider  atorvastatin (LIPITOR) 40 MG tablet Take 1 tablet (40 mg total) by mouth daily. 04/04/23   Johnson, Megan P, DO  citalopram (CELEXA) 20 MG tablet Take 1  tablet (20 mg total) by mouth daily. 03/21/23   Johnson, Megan P, DO  nicotine (NICODERM CQ) 14 mg/24hr patch Place 1 patch (14 mg total) onto the skin daily. 05/19/23   Johnson, Megan P, DO  nicotine (NICODERM CQ) 21 mg/24hr patch Place 1 patch (21 mg total) onto the skin daily. 05/19/23   Johnson, Megan P, DO  nicotine (NICODERM CQ) 7 mg/24hr patch Place 1 patch (7 mg total) onto the skin daily. 03/21/23   Johnson, Megan P, DO  omeprazole (PRILOSEC) 20 MG capsule Take 1-2 capsules (20-40 mg total) by mouth daily. 04/04/23   Johnson, Megan P, DO      VITAL SIGNS:  Blood pressure 119/70, pulse 82, temperature 98.7 F (37.1 C), temperature source Oral, resp. rate 20, height 5\' 5"  (1.651 m), weight 83.9 kg, SpO2 99%.  PHYSICAL EXAMINATION:  Physical Exam  GENERAL:  43 y.o.-year-old male patient lying in the bed with no acute distress.  EYES: Pupils equal, round, reactive to light and accommodation. No scleral icterus. Extraocular muscles intact.  HEENT: Head atraumatic, normocephalic. Oropharynx and nasopharynx clear.  NECK:  Supple, no jugular venous distention. No thyroid enlargement, no tenderness.  LUNGS: Normal breath sounds bilaterally, no wheezing, rales,rhonchi or crepitation. No use of accessory muscles of respiration.  CARDIOVASCULAR: Regular rate and rhythm, S1, S2 normal. No murmurs, rubs, or gallops.  ABDOMEN: Soft, nondistended, with left lower quadrant tenderness without amount of guarding or rigidity.  Bowel sounds present. No organomegaly or mass.  EXTREMITIES: No pedal edema, cyanosis, or clubbing.  NEUROLOGIC: Cranial nerves II through XII are intact. Muscle strength 5/5 in all extremities. Sensation intact. Gait not checked.  PSYCHIATRIC: The patient is alert and oriented x 3.  Normal affect and good eye contact. SKIN: No obvious rash, lesion, or ulcer.   LABORATORY PANEL:   CBC Recent Labs  Lab 08/07/23 0113  WBC 14.9*  HGB 18.0*  HCT 52.2*  PLT 259    ------------------------------------------------------------------------------------------------------------------  Chemistries  Recent Labs  Lab 08/07/23 0113  NA 136  K 3.2*  CL 102  CO2 16*  GLUCOSE 208*  BUN 14  CREATININE 1.68*  CALCIUM 10.4*  AST 32  ALT 46*  ALKPHOS 116  BILITOT 1.5*   ------------------------------------------------------------------------------------------------------------------  Cardiac Enzymes No results for input(s): "TROPONINI" in the last 168 hours. ------------------------------------------------------------------------------------------------------------------  RADIOLOGY:  CT ABDOMEN PELVIS W CONTRAST Result Date: 08/07/2023 CLINICAL DATA:  Left lower quadrant abdominal pain, vomiting, diarrhea EXAM: CT ABDOMEN AND PELVIS WITH CONTRAST TECHNIQUE: Multidetector CT imaging of the abdomen and pelvis was performed using the standard protocol following bolus administration of intravenous contrast. RADIATION DOSE REDUCTION: This exam was performed according to the departmental dose-optimization program which includes automated exposure control, adjustment of the mA and/or kV according to patient size and/or use of iterative reconstruction technique. CONTRAST:  OMNIPAQUE IOHEXOL 300 MG/ML  SOLN COMPARISON:  03/30/2019 FINDINGS: Lower chest: Lung bases are clear. Hepatobiliary: Subcentimeter cyst in  segment 4 (series 2/image 19), benign. Gallbladder is unremarkable. No intrahepatic or extrahepatic ductal dilatation. Pancreas: Within normal limits. Spleen: Within normal limits. Adrenals/Urinary Tract: Adrenal glands are within normal limits. Kidneys are within normal limits.  No hydronephrosis. Mildly thick-walled bladder, although underdistended. Stomach/Bowel: Stomach is within normal limits. No evidence of bowel obstruction. Normal appendix (series 2/image 55). 3.5 x 4.7 x 4.1 cm enhancing polypoid soft tissue mass in the ascending colon (coronal image  50), highly suspicious for colonic adenocarcinoma. No pericolonic extension. Vascular/Lymphatic: No evidence of abdominal aortic aneurysm. No suspicious abdominopelvic lymphadenopathy. Specifically, no suspicious ileocolic nodes. Reproductive: Prostate is unremarkable. Other: No abdominopelvic ascites. Musculoskeletal: Visualized osseous structures are within normal limits. IMPRESSION: No CT findings to account for the patient's left lower quadrant abdominal pain. 4.7 cm enhancing polypoid soft tissue mass in the ascending colon, highly suspicious for colonic adenocarcinoma. GI consultation for colonoscopy is suggested. No evidence of metastatic disease. Electronically Signed   By: Charline Bills M.D.   On: 08/07/2023 02:28      IMPRESSION AND PLAN:  Assessment and Plan: * AKI (acute kidney injury) (HCC) - This is clearly prerenal due to volume depletion and dehydration from recurrent nausea, vomiting and diarrhea. - The patient will be admitted to a medical-surgical observation bed. - We will continue hydration with IV normal saline with added potassium chloride. - Will follow BMP. - Will avoid nephrotoxins.  Acute gastroenteritis - The patient will be placed on IV PPI therapy. - We will continue hydration with IV normal saline with added potassium chloride. - As needed antiemetics and antidiarrheals will be provided.  Colonic mass - GI consultation will be obtained. - I notified Dr. Allegra Lai about the patient. - General Surgery consult will be obtained. - I notified Dr. Everlene Farrier about the patient.  Hypokalemia - This is likely secondary to his recurrent nausea and vomiting with diarrhea. - Potassium will be replaced and magnesium level will be checked.  Dyslipidemia - We will continue statin therapy.  Anxiety and depression - We will continue Celexa.  GERD without esophagitis - We will continue PPI therapy as mentioned above.    DVT prophylaxis: SCDs.  Medical prophylaxis is  being held off given high risk of bleeding with acute gastritis with history of gastric ulcer. Advanced Care Planning:  Code Status: full code. Family Communication:  The plan of care was discussed in details with the patient (and family). I answered all questions. The patient agreed to proceed with the above mentioned plan. Further management will depend upon hospital course. Disposition Plan: Back to previous home environment Consults called: GI. All the records are reviewed and case discussed with ED provider.  Status is: Observation  I certify that at the time of admission, it is my clinical judgment that the patient will require  hospital care extending less than 2 midnights.                            Dispo: The patient is from: Home              Anticipated d/c is to: Home              Patient currently is not medically stable to d/c.              Difficult to place patient: No  Hannah Beat M.D on 08/07/2023 at 4:29 AM  [facility]   From 7 PM-7 AM, contact night-coverage [email or web address]  CC: Primary care physician; [name], DO

## 2023-08-07 NOTE — ED Triage Notes (Signed)
 Patient C/O abdominal pain, vomiting, and diarrhea that began around three hours ago after patient consumed sushi. EMS gave 4 of zofran.

## 2023-08-07 NOTE — Assessment & Plan Note (Signed)
-   The patient will be placed on IV PPI therapy. - We will continue hydration with IV normal saline with added potassium chloride. - As needed antiemetics and antidiarrheals will be provided.

## 2023-08-07 NOTE — ED Provider Notes (Signed)
 [facility] Provider Note    Event Date/Time   First MD Initiated Contact with Patient 08/07/23 0110     (approximate)   History   Emesis and Diarrhea   HPI  Rodney Myers is a 43 y.o. male with history of GERD, kidney stones, anxiety, presenting with nausea vomiting and diarrhea that started today.  Also complaining of abdominal pain.  Started about 3 hours ago after he consumed sushi.  States that wife ate similar sushi without any symptoms.  Also having some right lower back pain that he thought might be related to kidney stones.  No urinary symptoms, no fever, cough.  Per EMS he received Zofran 4 mg.  Independent history obtained from EMS.  On independent review he was seen by his primary care doctor in October last year, vaccinations up-to-date.      Physical Exam   Triage Vital Signs: ED Triage Vitals  Encounter Vitals Group     BP 08/07/23 0107 118/89     Systolic BP Percentile --      Diastolic BP Percentile --      Pulse Rate 08/07/23 0107 84     Resp 08/07/23 0107 18     Temp 08/07/23 0107 97.6 F (36.4 C)     Temp Source 08/07/23 0107 Oral     SpO2 08/07/23 0107 98 %     Weight 08/07/23 0108 185 lb (83.9 kg)     Height 08/07/23 0108 5\' 5"  (1.651 m)     Head Circumference --      Peak Flow --      Pain Score --      Pain Loc --      Pain Education --      Exclude from Growth Chart --     Most recent vital signs: Vitals:   08/07/23 0400 08/07/23 0402  BP: 119/70   Pulse: 82   Resp: 20   Temp:  98.7 F (37.1 C)  SpO2: 99%      General: Awake, no distress.  CV:  Good peripheral perfusion.  Resp:  Normal effort.  Abd:  No distention.  Soft, mildly tender to the left lower quadrant without guarding or rebound Other:  No CVA tenderness, no right flank or tenderness to his back.   ED Results / Procedures / Treatments   Labs (all labs ordered are listed, but only abnormal results are displayed) Labs Reviewed   COMPREHENSIVE METABOLIC PANEL - Abnormal; Notable for the following components:      Result Value   Potassium 3.2 (*)    CO2 16 (*)    Glucose, Bld 208 (*)    Creatinine, Ser 1.68 (*)    Calcium 10.4 (*)    Total Protein 9.4 (*)    Albumin 5.3 (*)    ALT 46 (*)    Total Bilirubin 1.5 (*)    GFR, Estimated 52 (*)    Anion gap 18 (*)    All other components within normal limits  CBC WITH DIFFERENTIAL/PLATELET - Abnormal; Notable for the following components:   WBC 14.9 (*)    RBC 6.52 (*)    Hemoglobin 18.0 (*)    HCT 52.2 (*)    Neutro Abs 13.0 (*)    Abs Immature Granulocytes 0.08 (*)    All other components within normal limits  RESP PANEL BY RT-PCR (RSV, FLU A&B, COVID)  RVPGX2  URINALYSIS, W/ REFLEX TO CULTURE (INFECTION SUSPECTED)  HIV ANTIBODY (ROUTINE  TESTING W REFLEX)  BASIC METABOLIC PANEL  CBC     EKG  Sinus rhythm, rate 83, normal QS, normal QTc, T wave flattening in aVL, no ischemic ST elevation, no prior to compare   RADIOLOGY CT imaging on my interpretation showed a colonic mass in the ascending colon.   PROCEDURES:  Critical Care performed: No  Procedures   MEDICATIONS ORDERED IN ED: Medications  atorvastatin (LIPITOR) tablet 40 mg (has no administration in time range)  citalopram (CELEXA) tablet 20 mg (has no administration in time range)  pantoprazole (PROTONIX) injection 40 mg (has no administration in time range)  0.9 % NaCl with KCl 20 mEq/ L  infusion (has no administration in time range)  acetaminophen (TYLENOL) tablet 650 mg (has no administration in time range)    Or  acetaminophen (TYLENOL) suppository 650 mg (has no administration in time range)  traZODone (DESYREL) tablet 25 mg (has no administration in time range)  magnesium hydroxide (MILK OF MAGNESIA) suspension 30 mL (has no administration in time range)  ondansetron (ZOFRAN) tablet 4 mg (has no administration in time range)    Or  ondansetron (ZOFRAN) injection 4 mg (has no  administration in time range)  lactated ringers bolus 1,000 mL (0 mLs Intravenous Stopped 08/07/23 0321)  ondansetron (ZOFRAN) injection 4 mg (4 mg Intravenous Given 08/07/23 0118)  iohexol (OMNIPAQUE) 300 MG/ML solution 100 mL (100 mLs Intravenous Contrast Given 08/07/23 0217)  lactated ringers bolus 1,000 mL (1,000 mLs Intravenous New Bag/Given 08/07/23 0351)     IMPRESSION / MDM / ASSESSMENT AND PLAN / ED COURSE  I reviewed the triage vital signs and the nursing notes.                              Differential diagnosis includes, but is not limited to, food poisoning, gastroenteritis, norovirus, influenza, colitis, diverticulitis, kidney stone.  Will get labs, UA, CT, fluids, IV Zofran.  Patient's presentation is most consistent with acute presentation with potential threat to life or bodily function.  Independent review of labs and imaging are below.  Discussed labs and imaging results with patient and family.  Patient still feeling poorly, we will plan to have him admitted for additional fluids, further management of AKI as well as a GI consult for the mass.  Consulted hospitalist who is agreeable with the plan for admission and will evaluate the patient.  He is admitted.  Clinical Course as of 08/07/23 0409  Mon Aug 07, 2023  0227 Independent review of labs, he is a leukocytosis, electrolytes not severely deranged, he has an AKI, ALT is mildly elevated and so his T. bili.  Suspect this might be due to the nausea vomiting since he is no right upper quadrant abdominal pain or jaundice. [TT]  0227 Respiratory viral panel is negative. [TT]  [id number] CT ABDOMEN PELVIS W CONTRAST IMPRESSION: No CT findings to account for the patient's left lower quadrant abdominal pain.  4.7 cm enhancing polypoid soft tissue mass in the ascending colon, highly suspicious for colonic adenocarcinoma. GI consultation for colonoscopy is suggested.  No evidence of metastatic disease.   [TT]  0347 On  reassessment patient still feels poorly, will give him additional IV fluids.  Shared the CT scan findings with patient and his family.  Will plan to have him admitted for further management of his symptoms and AKI as well as, GI consult while inpatient for the mass. [TT]    Clinical  Course User Index [TT] Jodie Echevaria, Franchot Erichsen, MD     FINAL CLINICAL IMPRESSION(S) / ED DIAGNOSES   Final diagnoses:  Dehydration  Nausea vomiting and diarrhea  AKI (acute kidney injury) (HCC)  Colonic mass     Rx / DC Orders   ED Discharge Orders     None        Note:  This document was prepared using Dragon voice recognition software and may include unintentional dictation errors.    Claybon Jabs, MD 08/07/23 [phone]

## 2023-08-07 NOTE — Assessment & Plan Note (Addendum)
-   This is likely secondary to his recurrent nausea and vomiting with diarrhea. - Potassium will be replaced and magnesium level will be checked.

## 2023-08-07 NOTE — Assessment & Plan Note (Signed)
-   This is clearly prerenal due to volume depletion and dehydration from recurrent nausea, vomiting and diarrhea. - The patient will be admitted to a medical-surgical observation bed. - We will continue hydration with IV normal saline with added potassium chloride. - Will follow BMP. - Will avoid nephrotoxins.

## 2023-08-07 NOTE — Assessment & Plan Note (Signed)
-  We will continue Celexa. 

## 2023-08-08 ENCOUNTER — Inpatient Hospital Stay: Payer: PRIVATE HEALTH INSURANCE | Admitting: Anesthesiology

## 2023-08-08 ENCOUNTER — Encounter
Admission: EM | Disposition: A | Discharge status: Home / Self Care | Payer: Self-pay | Source: Home / Self Care | Attending: Internal Medicine

## 2023-08-08 ENCOUNTER — Encounter: Payer: Self-pay | Admitting: Internal Medicine

## 2023-08-08 DIAGNOSIS — K6389 Other specified diseases of intestine: Secondary | ICD-10-CM | POA: Diagnosis not present

## 2023-08-08 DIAGNOSIS — D122 Benign neoplasm of ascending colon: Secondary | ICD-10-CM

## 2023-08-08 DIAGNOSIS — E8721 Acute metabolic acidosis: Secondary | ICD-10-CM | POA: Insufficient documentation

## 2023-08-08 DIAGNOSIS — N179 Acute kidney failure, unspecified: Secondary | ICD-10-CM | POA: Diagnosis not present

## 2023-08-08 DIAGNOSIS — K529 Noninfective gastroenteritis and colitis, unspecified: Secondary | ICD-10-CM | POA: Diagnosis not present

## 2023-08-08 DIAGNOSIS — E876 Hypokalemia: Secondary | ICD-10-CM | POA: Diagnosis not present

## 2023-08-08 DIAGNOSIS — K219 Gastro-esophageal reflux disease without esophagitis: Secondary | ICD-10-CM

## 2023-08-08 HISTORY — PX: COLONOSCOPY WITH PROPOFOL: SHX5780

## 2023-08-08 LAB — CBC
HCT: 38.9 % — ABNORMAL LOW (ref 39.0–52.0)
Hemoglobin: 13.1 g/dL (ref 13.0–17.0)
MCH: 27.7 pg (ref 26.0–34.0)
MCHC: 33.7 g/dL (ref 30.0–36.0)
MCV: 82.2 fL (ref 80.0–100.0)
Platelets: 173 10*3/uL (ref 150–400)
RBC: 4.73 MIL/uL (ref 4.22–5.81)
RDW: 14 % (ref 11.5–15.5)
WBC: 8.1 10*3/uL (ref 4.0–10.5)
nRBC: 0 % (ref 0.0–0.2)

## 2023-08-08 LAB — BASIC METABOLIC PANEL
Anion gap: 6 (ref 5–15)
BUN: 10 mg/dL (ref 6–20)
CO2: 20 mmol/L — ABNORMAL LOW (ref 22–32)
Calcium: 8.2 mg/dL — ABNORMAL LOW (ref 8.9–10.3)
Chloride: 113 mmol/L — ABNORMAL HIGH (ref 98–111)
Creatinine, Ser: 0.99 mg/dL (ref 0.61–1.24)
GFR, Estimated: >60 mL/min (ref >60)
Glucose, Bld: 104 mg/dL — ABNORMAL HIGH (ref 70–99)
Potassium: 3.6 mmol/L (ref 3.5–5.1)
Sodium: 139 mmol/L (ref 135–145)

## 2023-08-08 SURGERY — COLONOSCOPY WITH PROPOFOL
Anesthesia: General

## 2023-08-08 MED ORDER — LIDOCAINE HCL (CARDIAC) PF 100 MG/5ML IV SOSY
PREFILLED_SYRINGE | INTRAVENOUS | Status: DC | PRN
  Administered 2023-08-08: 70 mg via INTRAVENOUS

## 2023-08-08 MED ORDER — PROPOFOL 1000 MG/100ML IV EMUL
INTRAVENOUS | Status: AC
  Filled 2023-08-08: qty 100

## 2023-08-08 MED ORDER — STERILE WATER FOR IRRIGATION IR SOLN: Status: DC | PRN

## 2023-08-08 MED ORDER — PROPOFOL 500 MG/50ML IV EMUL
INTRAVENOUS | Status: DC | PRN
  Administered 2023-08-08: 120 ug/kg/min via INTRAVENOUS

## 2023-08-08 MED ORDER — PROPOFOL 10 MG/ML IV BOLUS
INTRAVENOUS | Status: DC | PRN
  Administered 2023-08-08: 100 mg via INTRAVENOUS

## 2023-08-08 MED ORDER — SODIUM CHLORIDE 0.9 % IV SOLN: INTRAVENOUS | Status: DC | PRN

## 2023-08-08 MED ORDER — SPOT INK MARKER SYRINGE KIT
PACK | SUBMUCOSAL | Status: DC | PRN
  Administered 2023-08-08: 4 mL via SUBMUCOSAL

## 2023-08-08 NOTE — Plan of Care (Signed)

## 2023-08-08 NOTE — Transfer of Care (Signed)
 Immediate Anesthesia Transfer of Care Note  Patient: ROSHAD HACK  Procedure(s) Performed: COLONOSCOPY WITH PROPOFOL  Patient Location: PACU and Endoscopy Unit  Anesthesia Type:General  Level of Consciousness: drowsy  Airway & Oxygen Therapy: Patient Spontanous Breathing  Post-op Assessment: Report given to RN and Post -op Vital signs reviewed and stable  Post vital signs: Reviewed and stable  Last Vitals:  Vitals Value Taken Time  BP 90/52 08/08/23 1236  Temp 36.3 C 08/08/23 1235  Pulse 87 08/08/23 1236  Resp 18 08/08/23 1235  SpO2 97 % 08/08/23 1236  Vitals shown include unfiled device data.  Last Pain:  Vitals:   08/08/23 1235  TempSrc: Temporal  PainSc: Asleep         Complications: No notable events documented.

## 2023-08-08 NOTE — Discharge Summary (Signed)
 Physician Discharge Summary   Patient: Rodney Myers MRN: [id number] DOB: July 06, 1980  Admit date:     08/07/2023  Discharge date: 08/08/23  Discharge Physician: Marcelino Duster   PCP: [name], DO   Recommendations at discharge:  {Tip this will not be part of the note when signed- Example include specific recommendations for outpatient follow-up, pending tests to follow-up on. (Optional):26781}  PCP follow up in 1 week. Surgery follow up as scheduled 08/16/23.  Discharge Diagnoses: Principal Problem:   AKI (acute kidney injury) (HCC) Active Problems:   Acute gastroenteritis   Colonic mass   Hypokalemia   Dyslipidemia   GERD without esophagitis   Anxiety and depression  Resolved Problems:   * No resolved hospital problems. [facility] Course: No notes on file  Assessment and Plan: * AKI (acute kidney injury) (HCC) - This is clearly prerenal due to volume depletion and dehydration from recurrent nausea, vomiting and diarrhea. - The patient will be admitted to a medical-surgical observation bed. - We will continue hydration with IV normal saline with added potassium chloride. - Will follow BMP. - Will avoid nephrotoxins.  Acute gastroenteritis - The patient will be placed on IV PPI therapy. - We will continue hydration with IV normal saline with added potassium chloride. - As needed antiemetics and antidiarrheals will be provided.  Colonic mass - GI consultation will be obtained. - I notified Dr. Allegra Lai about the patient. - General Surgery consult will be obtained. - I notified Dr. Everlene Farrier about the patient.  Hypokalemia - This is likely secondary to his recurrent nausea and vomiting with diarrhea. - Potassium will be replaced and magnesium level will be checked.  Dyslipidemia - We will continue statin therapy.  Anxiety and depression - We will continue Celexa.  GERD without esophagitis - We will continue PPI therapy as mentioned above.       {Tip this will not be part of the note when signed Body mass index is 30.79 kg/m. , ,  (Optional):26781}  {(NOTE) Pain control PDMP Statment (Optional):26782} Consultants: GI, Surgery Procedures performed: Colonscopy  Disposition: Home Diet recommendation:  Discharge Diet Orders (From admission, onward)     Start     Ordered   08/08/23 0000  Diet - low sodium heart healthy        08/08/23 1607           Cardiac diet DISCHARGE MEDICATION: Allergies as of 08/08/2023       Reactions   Wellbutrin [bupropion] Other (See Comments)   irritablity        Medication List     TAKE these medications    atorvastatin 40 MG tablet Commonly known as: LIPITOR Take 1 tablet (40 mg total) by mouth daily.   citalopram 20 MG tablet Commonly known as: CELEXA Take 1 tablet (20 mg total) by mouth daily.   nicotine 7 mg/24hr patch Commonly known as: Nicoderm CQ Place 1 patch (7 mg total) onto the skin daily. What changed: Another medication with the same name was removed. Continue taking this medication, and follow the directions you see here.   omeprazole 20 MG capsule Commonly known as: PRILOSEC Take 1-2 capsules (20-40 mg total) by mouth daily.        Follow-up Information     Amber[place], [place], MD Follow up on 08/16/2023.   Specialty: General Surgery Contact information: [address] [place] [address] [phone]         [name], DO  Follow up in 1 week(s).   Specialty: Family Medicine Contact information: [address] [place] [address] [phone]                Discharge Exam: Filed Weights   08/07/23 0108  Weight: 83.9 kg      08/08/2023    2:06 PM 08/08/2023   12:45 PM 08/08/2023   12:36 PM  Vitals with BMI  Systolic 119 88 90  Diastolic 84 54 52  Pulse 68  77    General - Young  Caucasian male, no apparent distress HEENT - PERRLA, EOMI, atraumatic head, non tender sinuses. Lung - Clear, no rales, rhonchi,  wheezes. Heart - S1, S2 heard, no murmurs, rubs, no pedal edema. Abdomen - Soft, non tender, bowel sounds good Neuro - Alert, awake and oriented x 3, non focal exam. Skin - Warm and dry.  Condition at discharge: stable  The results of significant diagnostics from this hospitalization (including imaging, microbiology, ancillary and laboratory) are listed below for reference.   Imaging Studies: CT ABDOMEN PELVIS W CONTRAST Result Date: 08/07/2023 CLINICAL DATA:  Left lower quadrant abdominal pain, vomiting, diarrhea EXAM: CT ABDOMEN AND PELVIS WITH CONTRAST TECHNIQUE: Multidetector CT imaging of the abdomen and pelvis was performed using the standard protocol following bolus administration of intravenous contrast. RADIATION DOSE REDUCTION: This exam was performed according to the departmental dose-optimization program which includes automated exposure control, adjustment of the mA and/or kV according to patient size and/or use of iterative reconstruction technique. CONTRAST:  OMNIPAQUE IOHEXOL 300 MG/ML  SOLN COMPARISON:  03/30/2019 FINDINGS: Lower chest: Lung bases are clear. Hepatobiliary: Subcentimeter cyst in segment 4 (series 2/image 19), benign. Gallbladder is unremarkable. No intrahepatic or extrahepatic ductal dilatation. Pancreas: Within normal limits. Spleen: Within normal limits. Adrenals/Urinary Tract: Adrenal glands are within normal limits. Kidneys are within normal limits.  No hydronephrosis. Mildly thick-walled bladder, although underdistended. Stomach/Bowel: Stomach is within normal limits. No evidence of bowel obstruction. Normal appendix (series 2/image 55). 3.5 x 4.7 x 4.1 cm enhancing polypoid soft tissue mass in the ascending colon (coronal image 50), highly suspicious for colonic adenocarcinoma. No pericolonic extension. Vascular/Lymphatic: No evidence of abdominal aortic aneurysm. No suspicious abdominopelvic lymphadenopathy. Specifically, no suspicious ileocolic nodes.  Reproductive: Prostate is unremarkable. Other: No abdominopelvic ascites. Musculoskeletal: Visualized osseous structures are within normal limits. IMPRESSION: No CT findings to account for the patient's left lower quadrant abdominal pain. 4.7 cm enhancing polypoid soft tissue mass in the ascending colon, highly suspicious for colonic adenocarcinoma. GI consultation for colonoscopy is suggested. No evidence of metastatic disease. Electronically Signed   By: Charline Bills M.D.   On: 08/07/2023 02:28    Microbiology: Results for orders placed or performed during the hospital encounter of 08/07/23  Resp panel by RT-PCR (RSV, Flu A&B, Covid) Anterior Nasal Swab     Status: None   Collection Time: 08/07/23  1:20 AM   Specimen: Anterior Nasal Swab  Result Value Ref Range Status   SARS Coronavirus 2 by RT PCR NEGATIVE NEGATIVE Final    Comment: (NOTE) SARS-CoV-2 target nucleic acids are NOT DETECTED.  The SARS-CoV-2 RNA is generally detectable in upper respiratory specimens during the acute phase of infection. The lowest concentration of SARS-CoV-2 viral copies this assay can detect is 138 copies/mL. A negative result does not preclude SARS-Cov-2 infection and should not be used as the sole basis for treatment or other patient management decisions. A negative result may occur with  improper specimen collection/handling, submission  of specimen other than nasopharyngeal swab, presence of viral mutation(s) within the areas targeted by this assay, and inadequate number of viral copies(<138 copies/mL). A negative result must be combined with clinical observations, patient history, and epidemiological information. The expected result is Negative.  Fact Sheet for Patients:  BloggerCourse.com  Fact Sheet for Healthcare Providers:  SeriousBroker.it  This test is no t yet approved or cleared by the [place] FDA and  has been authorized for  detection and/or diagnosis of SARS-CoV-2 by FDA under an Emergency Use Authorization (EUA). This EUA will remain  in effect (meaning this test can be used) for the duration of the COVID-19 declaration under Section 564(b)(1) of the Act, 21 U.S.C.section 360bbb-3(b)(1), unless the authorization is terminated  or revoked sooner.       Influenza A by PCR NEGATIVE NEGATIVE Final   Influenza B by PCR NEGATIVE NEGATIVE Final    Comment: (NOTE) The Xpert Xpress SARS-CoV-2/FLU/RSV plus assay is intended as an aid in the diagnosis of influenza from Nasopharyngeal swab specimens and should not be used as a sole basis for treatment. Nasal washings and aspirates are unacceptable for Xpert Xpress SARS-CoV-2/FLU/RSV testing.  Fact Sheet for Patients: BloggerCourse.com  Fact Sheet for Healthcare Providers: SeriousBroker.it  This test is not yet approved or cleared by the [place] FDA and has been authorized for detection and/or diagnosis of SARS-CoV-2 by FDA under an Emergency Use Authorization (EUA). This EUA will remain in effect (meaning this test can be used) for the duration of the COVID-19 declaration under Section 564(b)(1) of the Act, 21 U.S.C. section 360bbb-3(b)(1), unless the authorization is terminated or revoked.     Resp Syncytial Virus by PCR NEGATIVE NEGATIVE Final    Comment: (NOTE) Fact Sheet for Patients: BloggerCourse.com  Fact Sheet for Healthcare Providers: SeriousBroker.it  This test is not yet approved or cleared by the [place] FDA and has been authorized for detection and/or diagnosis of SARS-CoV-2 by FDA under an Emergency Use Authorization (EUA). This EUA will remain in effect (meaning this test can be used) for the duration of the COVID-19 declaration under Section 564(b)(1) of the Act, 21 U.S.C. section 360bbb-3(b)(1), unless the authorization is  terminated or revoked.  Performed at [facility], [address] [place], [place] [address]     Labs: CBC: Recent Labs  Lab 08/07/23 0113 08/07/23 0518 08/08/23 0608  WBC 14.9* 12.6* 8.1  NEUTROABS 13.0*  --   --   HGB 18.0* 15.7 13.1  HCT 52.2* 46.4 38.9*  MCV 80.1 82.6 82.2  PLT 259 190 173   Basic Metabolic Panel: Recent Labs  Lab 08/07/23 0113 08/07/23 0518 08/08/23 0608  NA 136 139 139  K 3.2* 3.8 3.6  CL 102 103 113*  CO2 16* 22 20*  GLUCOSE 208* 143* 104*  BUN 14 15 10   CREATININE 1.68* 1.39* 0.99  CALCIUM 10.4* 9.7 8.2*   Liver Function Tests: Recent Labs  Lab 08/07/23 0113  AST 32  ALT 46*  ALKPHOS 116  BILITOT 1.5*  PROT 9.4*  ALBUMIN 5.3*   CBG: No results for input(s): "GLUCAP" in the last 168 hours.  Discharge time spent: 35 minutes.  Signed: Marcelino Duster, MD [facility] 08/08/2023

## 2023-08-08 NOTE — Op Note (Signed)
 [facility] Patient Name: Rodney Myers Procedure Date: 08/08/2023 11:56 AM MRN: [id number] Account #: [id number] Date of Birth: 08/25/1980 Admit Type: Outpatient Age: 43 Room: [facility] ENDO ROOM 1 Gender: Male Note Status: Finalized Instrument Name: Prentice Docker [id number] Procedure:             Colonoscopy Indications:           Abnormal CT of the GI tract Providers:             [name] MD, MD Referring MD:          [name] (Referring MD) Medicines:             Propofol per Anesthesia Complications:         No immediate complications. Procedure:             Pre-Anesthesia Assessment:                        - Prior to the procedure, a History and Physical was                         performed, and patient medications and allergies were                         reviewed. The patient's tolerance of previous                         anesthesia was also reviewed. The risks and benefits                         of the procedure and the sedation options and risks                         were discussed with the patient. All questions were                         answered, and informed consent was obtained. Prior                         Anticoagulants: The patient has taken no anticoagulant                         or antiplatelet agents. ASA Grade Assessment: II - A                         patient with mild systemic disease. After reviewing                         the risks and benefits, the patient was deemed in                         satisfactory condition to undergo the procedure.                        After obtaining informed consent, the colonoscope was                         passed under direct vision. Throughout the procedure,  the patient's blood pressure, pulse, and oxygen                         saturations were monitored continuously. The                         Colonoscope was introduced through the anus and                          advanced to the the cecum, identified by appendiceal                         orifice and ileocecal valve. The colonoscopy was                         performed without difficulty. The patient tolerated                         the procedure well. The quality of the bowel                         preparation was excellent. Findings:      The perianal and digital rectal examinations were normal.      A 30 mm polyp was found in the proximal ascending colon. The polyp was       sessile. Biopsies were taken with a cold forceps for histology. Area was       successfully injected with 3 mL Spot (carbon black) for tattooing.      The exam was otherwise without abnormality. Impression:            - One 30 mm polyp in the proximal ascending colon.                         Biopsied.                        - The examination was otherwise normal. Recommendation:        - Return patient to hospital ward for ongoing care.                        - Resume previous diet.                        - Continue present medications.                        - Await pathology results. Procedure Code(s):     --- Professional ---                        [phone], Colonoscopy, flexible; with biopsy, single or                         multiple                        45381, Colonoscopy, flexible; with directed submucosal                         injection(s), any substance Diagnosis Code(s):     --- Professional ---  R93.3, Abnormal findings on diagnostic imaging of                         other parts of digestive tract                        D12.2, Benign neoplasm of ascending colon CPT copyright 2022 American Medical Association. All rights reserved. The codes documented in this report are preliminary and upon coder review may  be revised to meet current compliance requirements. [name] MD, MD 08/08/2023 12:33:53 PM This report has been signed electronically. Number of Addenda:  0 Note Initiated On: 08/08/2023 11:56 AM Scope Withdrawal Time: 0 hours 8 minutes 58 seconds  Total Procedure Duration: 0 hours 11 minutes 31 seconds  Estimated Blood Loss:  Estimated blood loss: none.      [facility]

## 2023-08-08 NOTE — Anesthesia Preprocedure Evaluation (Signed)
 Anesthesia Evaluation  Patient identified by MRN, date of birth, ID band Patient awake    Reviewed: Allergy & Precautions, NPO status , Patient's Chart, lab work & pertinent test results  Airway Mallampati: II  TM Distance: >3 FB Neck ROM: full    Dental no notable dental hx.    Pulmonary neg pulmonary ROS, former smoker   Pulmonary exam normal        Cardiovascular hypertension (not taking medications as prescribed), Normal cardiovascular exam     Neuro/Psych  PSYCHIATRIC DISORDERS Anxiety Depression    negative neurological ROS     GI/Hepatic Neg liver ROS,GERD  ,,  Endo/Other  negative endocrine ROS    Renal/GU Renal disease  negative genitourinary   Musculoskeletal   Abdominal   Peds  Hematology negative hematology ROS (+)   Anesthesia Other Findings Past Medical History: No date: Allergy No date: Anxiety No date: GERD (gastroesophageal reflux disease) No date: History of kidney stones No date: Hypertension  Past Surgical History: 11/04/2021: ESOPHAGOGASTRODUODENOSCOPY (EGD) WITH PROPOFOL; N/A     Comment:  Procedure: ESOPHAGOGASTRODUODENOSCOPY (EGD) WITH               PROPOFOL;  Surgeon: [name], MD;  Location: [facility]               ENDOSCOPY;  Service: Endoscopy;  Laterality: N/A; 07/2014: ROOT CANAL  BMI    Body Mass Index: 30.79 kg/m      Reproductive/Obstetrics negative OB ROS                              Anesthesia Physical Anesthesia Plan  ASA: 3  Anesthesia Plan: General   Post-op Pain Management: Minimal or no pain anticipated   Induction: Intravenous  PONV Risk Score and Plan: Propofol infusion and TIVA  Airway Management Planned: Natural Airway and Nasal Cannula  Additional Equipment:   Intra-op Plan:   Post-operative Plan:   Informed Consent: I have reviewed the patients History and Physical, chart, labs and discussed the procedure including the  risks, benefits and alternatives for the proposed anesthesia with the patient or authorized representative who has indicated his/her understanding and acceptance.     Dental Advisory Given  Plan Discussed with: Anesthesiologist, CRNA and Surgeon  Anesthesia Plan Comments: (Patient consented for risks of anesthesia including but not limited to:  - adverse reactions to medications - risk of airway placement if required - damage to eyes, teeth, lips or other oral mucosa - nerve damage due to positioning  - sore throat or hoarseness - Damage to heart, brain, nerves, lungs, other parts of body or loss of life  Patient voiced understanding and assent.)         Anesthesia Quick Evaluation

## 2023-08-08 NOTE — TOC CM/SW Note (Signed)
 Transition of Care [facility]) - Inpatient Brief Assessment   Patient Details  Name: Rodney Myers MRN: [id number] Date of Birth: 07/11/1980  Transition of Care [facility]) CM/SW Contact:    Chapman Fitch, RN Phone Number: 08/08/2023, 3:54 PM   Clinical Narrative:   Transition of Care [facility]) Screening Note   Patient Details  Name: [name] Date of Birth: 09-27-1980   Transition of Care [facility]) CM/SW Contact:    Chapman Fitch, RN Phone Number: 08/08/2023, 3:55 PM    Transition of Care Department [facility]) has reviewed patient and no TOC needs have been identified at this time. If new patient transition needs arise, please place a TOC consult.    Transition of Care Asessment: Insurance and Status: Insurance coverage has been reviewed Patient has primary care physician: Yes     Prior/Current Home Services: No current home services Social Drivers of Health Review: SDOH reviewed no interventions necessary Readmission risk has been reviewed: Yes Transition of care needs: no transition of care needs at this time

## 2023-08-08 NOTE — Progress Notes (Signed)
 This patient had a colonoscopy today with a large polyp found in the ascending colon that was biopsied and tattooed.  I have informed surgery that this will need to be removed surgically. I have informed the family of the findings and the patient has a copy of the report.  I will sign off.  Please call if any further GI concerns or questions.  We would like to thank you for the opportunity to participate in the care of Rodney Myers.

## 2023-08-08 NOTE — Plan of Care (Signed)
  Problem: Education: Goal: Knowledge of General Education information will improve Description: Including pain rating scale, medication(s)/side effects and non-pharmacologic comfort measures Outcome: Adequate for Discharge   Problem: Health Behavior/Discharge Planning: Goal: Ability to manage health-related needs will improve Outcome: Adequate for Discharge   Problem: Clinical Measurements: Goal: Ability to maintain clinical measurements within normal limits will improve Outcome: Adequate for Discharge Goal: Will remain free from infection Outcome: Adequate for Discharge Goal: Diagnostic test results will improve Outcome: Adequate for Discharge Goal: Respiratory complications will improve Outcome: Adequate for Discharge Goal: Cardiovascular complication will be avoided Outcome: Adequate for Discharge   Problem: Activity: Goal: Risk for activity intolerance will decrease Outcome: Adequate for Discharge   Problem: Nutrition: Goal: Adequate nutrition will be maintained Outcome: Adequate for Discharge   Problem: Coping: Goal: Level of anxiety will decrease Outcome: Adequate for Discharge   Problem: Elimination: Goal: Will not experience complications related to bowel motility Outcome: Adequate for Discharge Goal: Will not experience complications related to urinary retention Outcome: Adequate for Discharge   Problem: Pain Managment: Goal: General experience of comfort will improve and/or be controlled Outcome: Adequate for Discharge   Problem: Safety: Goal: Ability to remain free from injury will improve Outcome: Adequate for Discharge   Problem: Skin Integrity: Goal: Risk for impaired skin integrity will decrease Outcome: Adequate for Discharge  Pt ao x4, pt has all belongings. Pt has received all DC instruction. Pt taken down to lobby with staff via wheelchair.

## 2023-08-09 LAB — SURGICAL PATHOLOGY

## 2023-08-09 LAB — CEA: CEA: 1.3 ng/mL (ref 0.0–4.7)

## 2023-08-09 NOTE — Anesthesia Postprocedure Evaluation (Signed)
 Anesthesia Post Note  Patient: SEVYN PAREDEZ  Procedure(s) Performed: COLONOSCOPY WITH PROPOFOL  Patient location during evaluation: Endoscopy Anesthesia Type: General Level of consciousness: awake and alert Pain management: pain level controlled Vital Signs Assessment: post-procedure vital signs reviewed and stable Respiratory status: spontaneous breathing, nonlabored ventilation, respiratory function stable and patient connected to nasal cannula oxygen Cardiovascular status: blood pressure returned to baseline and stable Postop Assessment: no apparent nausea or vomiting Anesthetic complications: no   No notable events documented.   Last Vitals:  Vitals:   08/08/23 1255 08/08/23 1406  BP:  119/84  Pulse:  68  Resp: 18 18  Temp:  36.8 C  SpO2:  96%    Last Pain:  Vitals:   08/08/23 1406  TempSrc: Oral  PainSc:                  Louie Boston

## 2023-08-15 ENCOUNTER — Inpatient Hospital Stay: Payer: PRIVATE HEALTH INSURANCE | Admitting: Family Medicine

## 2023-08-16 ENCOUNTER — Encounter: Payer: Self-pay | Admitting: Surgery

## 2023-08-16 ENCOUNTER — Ambulatory Visit (INDEPENDENT_AMBULATORY_CARE_PROVIDER_SITE_OTHER): Payer: PRIVATE HEALTH INSURANCE | Admitting: Surgery

## 2023-08-16 VITALS — BP 138/89 | HR 67 | Temp 98.6°F | Ht 65.0 in | Wt 180.6 lb

## 2023-08-16 DIAGNOSIS — K6389 Other specified diseases of intestine: Secondary | ICD-10-CM | POA: Diagnosis not present

## 2023-08-16 MED ORDER — POLYETHYLENE GLYCOL 3350 17 GM/SCOOP PO POWD: ORAL | 0 refills | Status: DC

## 2023-08-16 MED ORDER — NEOMYCIN SULFATE 500 MG PO TABS: ORAL_TABLET | ORAL | 0 refills | Status: DC

## 2023-08-16 MED ORDER — METRONIDAZOLE 500 MG PO TABS: ORAL_TABLET | ORAL | 0 refills | Status: DC

## 2023-08-16 NOTE — Patient Instructions (Addendum)
 We have discussed removing a portion of your damaged colon through 4 small incisions today. We will schedule this surgery at [facility] with Dr. Everlene Farrier. Please plan a hospital stay of 3-7 days for surgery and recovery time.  We will have you complete a bowel prep prior to your surgery. Please see information provided.  You have also been given a (Blue) Pre-Care Sheet with more information regarding your particular surgery. Our surgery scheduler will call you to verify surgery date and to go over information.  Please review all information given.  You will need to arrange to be out of work for approximately 2 weeks and then you may return with a lifting restriction for 4 more weeks. If you have FMLA or Disability paperwork that needs to be filled out, please have your company fax your paperwork to [phone] or you may drop this by either office. This paperwork will be filled out within 3 days after your surgery has been completed.  Please call our office with any questions or concerns prior to your scheduled surgery.   Laparoscopic Colectomy Laparoscopic colectomy is surgery to remove part or all of the large intestine (colon). This procedure may be used to treat several conditions, including: Inflammation and infection of the colon (diverticulitis). Tumors or masses in the colon. Inflammatory bowel disease, such as Crohn disease or ulcerative colitis. Colectomy is an option when symptoms cannot be controlled with medicines. Bleeding from the colon that cannot be controlled by another method. Blockage or obstruction of the colon.  Tell a health care provider about: Any allergies you have. All medicines you are taking, including vitamins, herbs, eye drops, creams, and over-the-counter medicines. Any problems you or family members have had with anesthetic medicines. Any blood disorders you have. Any surgeries you have had. Any medical conditions you have. What are the risks? Generally, this is  a safe procedure. However, problems may occur, including: Infection. Bleeding. Allergic reactions to medicines or dyes. Damage to other structures or organs. Leaking from where the colon was sewn together. Future blockage of the small intestines from scar tissue. Another surgery may be needed to repair this. Needing to convert to an open procedure. Complications such as damage to other organs or excessive bleeding may require the surgeon to convert from a laparoscopic procedure to an open procedure. This involves making a larger incision in the abdomen.  What happens before the procedure?  Medicines Ask your health care provider about: Changing or stopping your regular medicines. This is especially important if you are taking diabetes medicines or blood thinners. Taking medicines such as aspirin and ibuprofen. These medicines can thin your blood. Do not take these medicines before your procedure if your health care provider instructs you not to. You may be given antibiotic medicine to clean out bacteria from your colon. Follow the directions carefully and take the medicine at the correct time. General instructions You may be prescribed an oral bowel prep to clean out your colon in preparation for the surgery: Follow instructions from your health care provider about how to do this. Do not eat or drink anything else after you have started the bowel prep, unless your health care provider tells you it is safe to do so. Do not use any products that contain nicotine or tobacco, such as cigarettes and e-cigarettes. If you need help quitting, ask your health care provider. What happens during the procedure? To reduce your risk of infection: Your health care team will wash or sanitize their hands.  Your skin will be washed with soap. An IV tube will be inserted into one of your veins to deliver fluid and medication. You will be given one of the following: A medicine to help you relax (sedative). A  medicine to make you fall asleep (general anesthetic). Small monitors will be connected to your body. They will be used to check your heart, blood pressure, and oxygen level. A breathing tube may be placed into your lungs during the procedure. A thin, flexible tube (catheter) will be placed into your bladder to drain urine. A tube may be placed through your nose and into your stomach to drain stomach fluids (nasogastric tube, or NG tube). Your abdomen will be filled with air so it expands. This gives the surgeon more room to operate and makes your organs easier to see. Several small cuts (incisions) will be made in your abdomen. A thin, lighted tube with a tiny camera on the end (laparoscope) will be put through one of the small incisions. The camera on the laparoscope will send a picture to a computer screen in the operating room. This will give the surgeon a good view inside your abdomen. Hollow tubes will be put through the other small incisions in your abdomen. The tools that are needed for the procedure will be put through these tubes. Clamps or staples will be put on both ends of the diseased part of the colon. The part of the intestine between the clamps or staples will be removed. If possible, the ends of the healthy colon that remain will be stitched (sutured) or stapled together to allow your body to pass waste (stool). Sometimes, the remaining colon cannot be stitched back together. If this is the case, a colostomy will be needed. If you need a colostomy: An opening to the outside of your body (stoma) will be made through your abdomen. The end of your colon will be brought to the opening. It will be stitched to the skin. A bag will be attached to the opening. Stool will drain into this removable bag. The colostomy may be temporary or permanent. The incisions from the colectomy will be closed with sutures or staples. The procedure may vary among health care providers and hospitals. What  happens after the procedure? Your blood pressure, heart rate, breathing rate, and blood oxygen level will be monitored until the medicines you were given have worn off. You will receive fluids through an IV tube until your bowels start to work properly. Once your bowels are working again, you will be given clear liquids first and then solid food as tolerated. You will be given medicines to control your pain and nausea, if needed. Do not drive for 24 hours if you were given a sedative. This information is not intended to replace advice given to you by your health care provider. Make sure you discuss any questions you have with your health care provider. Document Released: 08/20/2002 Document Revised: 02/29/2016 Document Reviewed: 02/29/2016 Elsevier Interactive Patient Education  2018 [employer].     Laparoscopic Colectomy, Care After This sheet gives you information about how to care for yourself after your procedure. Your health care provider may also give you more specific instructions. If you have problems or questions, contact your health care provider. What can I expect after the procedure? After your procedure, it is common to have the following: Pain in your abdomen, especially in the incision areas. You will be given medicine to control the pain. Tiredness. This is a normal  part of the recovery process. Your energy level will return to normal over the next several weeks. Changes in your bowel movements, such as constipation or needing to go more often. Talk with your health care provider about how to manage this.  Follow these instructions at home: Medicines Take over-the-counter and prescription medicines only as told by your health care provider. Do not drive or use heavy machinery while taking prescription pain medicine. Do not drink alcohol while taking prescription pain medicine. If you were prescribed an antibiotic medicine, use it as told by your health care provider. Do  not stop using the antibiotic even if you start to feel better. Incision care Follow instructions from your health care provider about how to take care of your incision areas. Make sure you: Keep your incisions clean and dry. Wash your hands with soap and water before and after applying medicine to the areas, and before and after changing your bandage (dressing). If soap and water are not available, use hand sanitizer. Change your dressing as told by your health care provider. Leave stitches (sutures), skin glue, or adhesive strips in place. These skin closures may need to stay in place for 2 weeks or longer. If adhesive strip edges start to loosen and curl up, you may trim the loose edges. Do not remove adhesive strips completely unless your health care provider tells you to do that. Do not wear tight clothing over the incisions. Tight clothing may rub and irritate the incision areas, which may cause the incisions to open. Do not take baths, swim, or use a hot tub until your health care provider approves. Ask your health care provider if you can take showers. You may only be allowed to take sponge baths for bathing. Check your incision area every day for signs of infection. Check for: More redness, swelling, or pain. More fluid or blood. Warmth. Pus or a bad smell. Activity Avoid lifting anything that is heavier than 10 lb (4.5 kg) for 2 weeks or until your health care provider says it is okay. You may resume normal activities as told by your health care provider. Ask your health care provider what activities are safe for you. Take rest breaks during the day as needed. Eating and drinking Follow instructions from your health care provider about what you can eat after surgery. To prevent or treat constipation while you are taking prescription pain medicine, your health care provider may recommend that you: Drink enough fluid to keep your urine clear or pale yellow. Take over-the-counter or  prescription medicines. Eat foods that are high in fiber, such as fresh fruits and vegetables, whole grains, and beans. Limit foods that are high in fat and processed sugars, such as fried and sweet foods. General instructions Ask your health care provider when you will need an appointment to get your sutures or staples removed. Keep all follow-up visits as told by your health care provider. This is important. Contact a health care provider if: You have more redness, swelling, or pain around your incisions. You have more fluid or blood coming from the incisions. Your incisions feel warm to the touch. You have pus or a bad smell coming from your incisions or your dressing. You have a fever. You have an incision that breaks open (edges not staying together) after sutures or staples have been removed. Get help right away if: You develop a rash. You have chest pain or difficulty breathing. You have pain or swelling in your legs. You feel light-headed  or you faint. Your abdomen swells (becomes distended). You have nausea or vomiting. You have blood in your stool (feces). This information is not intended to replace advice given to you by your health care provider. Make sure you discuss any questions you have with your health care provider. Document Released: 12/17/2004 Document Revised: 02/29/2016 Document Reviewed: 02/29/2016 Elsevier Interactive Patient Education  [employer].  Colon Surgery Prep  Your surgery is scheduled for 09/05/2023 Your last solid meal should be on 09/02/2023 (3 days before surgery) You should start a Clear liquid diet on 09/03/2023 (2 days before surgery) Continue clear liquid diet on 09/04/2023  A clear diet is anything you can see through(coffee, tea, coke, soda, ginger ale, water, Gatorade,apple juice, chicken or beef broth, jello with no fruit, popsicles, italian ice, etc.). No milk, creamers, and no Red liquids.                                                       Do Not Drink any RED Liquids  ** Be sure to pick up your Dulcolax, Miralax, Neomycin, Metronidazole and 64 oz bottle of Gatorade, no red. **  -Your medications have been called into [facility].   The day before surgery: 09/05/2023  - At 8:00 am, take all 4 Dulcolax tablets(5 mg each) along with 2 Neomycin tablets and 2 Metronidazole tablets.  - At 2:00 pm take 2 Neomycin tablets and 2 Metronidazole tablets. Also you will start your Miralax prep. Mix the entire bottle of Miralax into the 64 oz of Gatorade and mix well. Drink an 8oz(1 cup) glass of the mixture every 15-20 minutes until it is all gone. You may continue your clear liquid diet until midnight. After midnight follow the directions from Pre Admit testing as to what you may have.   -At 8:00 pm, take the last 2 tablets of Neomycin and 2 tablets of Metronidazole.   *If you are having a colostomy reversal, use 1 Fleets enema rectally 2-3 days prior to your surgery date.

## 2023-08-17 ENCOUNTER — Ambulatory Visit: Payer: PRIVATE HEALTH INSURANCE | Admitting: Family Medicine

## 2023-08-17 ENCOUNTER — Telehealth: Payer: Self-pay | Admitting: Surgery

## 2023-08-17 ENCOUNTER — Encounter: Payer: Self-pay | Admitting: Family Medicine

## 2023-08-17 VITALS — BP 121/73 | Temp 98.4°F

## 2023-08-17 DIAGNOSIS — N179 Acute kidney failure, unspecified: Secondary | ICD-10-CM

## 2023-08-17 DIAGNOSIS — K6389 Other specified diseases of intestine: Secondary | ICD-10-CM

## 2023-08-17 DIAGNOSIS — F339 Major depressive disorder, recurrent, unspecified: Secondary | ICD-10-CM

## 2023-08-17 DIAGNOSIS — F419 Anxiety disorder, unspecified: Secondary | ICD-10-CM | POA: Diagnosis not present

## 2023-08-17 DIAGNOSIS — E785 Hyperlipidemia, unspecified: Secondary | ICD-10-CM

## 2023-08-17 MED ORDER — CITALOPRAM HYDROBROMIDE 40 MG PO TABS: 40.0000 mg | ORAL_TABLET | Freq: Every day | ORAL | 3 refills | Status: DC

## 2023-08-17 NOTE — Telephone Encounter (Signed)
Patient called back and is aware of surgery information.  

## 2023-08-17 NOTE — Progress Notes (Signed)
 Outpatient Surgical Follow Up  08/17/2023  Rodney Myers is an 43 y.o. male.   Chief Complaint  Patient presents with   New Patient (Initial Visit)    Colon mass    HPI: Rodney Myers is a 43 year old male well-known to me with a recent hospitalization for abdominal pain diarrhea.  His symptoms have now resolved.  I did see him in the hospital and a CT scan was performed as well as a colonoscopy. CT showing ascending colon mass w/o mets,   Please note that I personally reviewed images showing evidence of ascending colon mass.  This was further characterized by colonoscopy, path showing tubular adenoma  Past Medical History:  Diagnosis Date   Allergy    Anxiety    GERD (gastroesophageal reflux disease)    History of kidney stones    Hypertension     Past Surgical History:  Procedure Laterality Date   COLONOSCOPY WITH PROPOFOL N/A 08/08/2023   Procedure: COLONOSCOPY WITH PROPOFOL;  Surgeon: [name], MD;  Location: [facility] ENDOSCOPY;  Service: Endoscopy;  Laterality: N/A;   ESOPHAGOGASTRODUODENOSCOPY (EGD) WITH PROPOFOL N/A 11/04/2021   Procedure: ESOPHAGOGASTRODUODENOSCOPY (EGD) WITH PROPOFOL;  Surgeon: [name], MD;  Location: [facility] ENDOSCOPY;  Service: Endoscopy;  Laterality: N/A;   ROOT CANAL  07/2014    Family History  Problem Relation Age of Onset   Cancer Maternal Grandfather        nasal   Hypertension Mother    Other Father    Diabetes Maternal Grandmother    Heart disease Paternal Grandfather     Social History:  reports that he quit smoking about 14 months ago. His smoking use included cigarettes. He has never used smokeless tobacco. He reports that he does not currently use alcohol. He reports current drug use. Frequency: 12.00 times per week. Drug: Marijuana.  Allergies:  Allergies  Allergen Reactions   Wellbutrin [Bupropion] Other (See Comments)    irritablity    Medications reviewed.  ROS Full ROS performed and is otherwise negative other than what is  stated in HPI   BP 138/89   Pulse 67   Temp 98.6 F (37 C) (Oral)   Ht 5\' 5"  (1.651 m)   Wt 180 lb 9.6 oz (81.9 kg)   SpO2 97%   BMI 30.05 kg/m   Physical Exam Vitals and nursing note reviewed. Exam conducted with a chaperone present.  Constitutional:      Appearance: Normal appearance. He is not ill-appearing.  Cardiovascular:     Rate and Rhythm: Normal rate and regular rhythm.     Heart sounds: No murmur heard. Pulmonary:     Effort: Pulmonary effort is normal.     Breath sounds: Normal breath sounds.  Abdominal:     General: Abdomen is flat. There is no distension.     Palpations: Abdomen is soft. There is no mass.     Tenderness: There is no abdominal tenderness. There is no guarding or rebound.     Hernia: No hernia is present.  Musculoskeletal:        General: No swelling or tenderness. Normal range of motion.     Cervical back: Normal range of motion and neck supple. No rigidity or tenderness.  Skin:    General: Skin is warm and dry.     Capillary Refill: Capillary refill takes less than 2 seconds.  Neurological:     General: No focal deficit present.     Mental Status: He is alert and oriented to person,  place, and time.  Psychiatric:        Mood and Affect: Mood normal.        Behavior: Behavior normal.        Thought Content: Thought content normal.        Judgment: Judgment normal.    Assessment/Plan: 43 year old male with right a ascending colon mass unable to be resected endoscopically.  Given the size I definitely recommend right colectomy.  Do think that he will be a candidate for minimally invasive approach.  She discussed with the patient in detail. THe Risks, benefits and possible complications including but not limited to, bleeding, infection, anastomotic leak, the need for ostomy, were described as well as hospitalization and postoperative course. They understand and wished to proceed. Please note that I spent 40 minutes in this encounter including  personally reviewing medical record, personally reviewing imaging studies, coordinating his care, placing orders and performing documentation  Sterling Big, MD [facility] General Surgeon

## 2023-08-17 NOTE — H&P (View-Only) (Signed)
 Outpatient Surgical Follow Up  08/17/2023  Rodney Myers is an 43 y.o. male.   Chief Complaint  Patient presents with   New Patient (Initial Visit)    Colon mass    HPI: Rodney Myers is a 43 year old male well-known to me with a recent hospitalization for abdominal pain diarrhea.  His symptoms have now resolved.  I did see him in the hospital and a CT scan was performed as well as a colonoscopy. CT showing ascending colon mass w/o mets,   Please note that I personally reviewed images showing evidence of ascending colon mass.  This was further characterized by colonoscopy, path showing tubular adenoma  Past Medical History:  Diagnosis Date   Allergy    Anxiety    GERD (gastroesophageal reflux disease)    History of kidney stones    Hypertension     Past Surgical History:  Procedure Laterality Date   COLONOSCOPY WITH PROPOFOL N/A 08/08/2023   Procedure: COLONOSCOPY WITH PROPOFOL;  Surgeon: [name], MD;  Location: [facility] ENDOSCOPY;  Service: Endoscopy;  Laterality: N/A;   ESOPHAGOGASTRODUODENOSCOPY (EGD) WITH PROPOFOL N/A 11/04/2021   Procedure: ESOPHAGOGASTRODUODENOSCOPY (EGD) WITH PROPOFOL;  Surgeon: [name], MD;  Location: [facility] ENDOSCOPY;  Service: Endoscopy;  Laterality: N/A;   ROOT CANAL  07/2014    Family History  Problem Relation Age of Onset   Cancer Maternal Grandfather        nasal   Hypertension Mother    Other Father    Diabetes Maternal Grandmother    Heart disease Paternal Grandfather     Social History:  reports that he quit smoking about 14 months ago. His smoking use included cigarettes. He has never used smokeless tobacco. He reports that he does not currently use alcohol. He reports current drug use. Frequency: 12.00 times per week. Drug: Marijuana.  Allergies:  Allergies  Allergen Reactions   Wellbutrin [Bupropion] Other (See Comments)    irritablity    Medications reviewed.  ROS Full ROS performed and is otherwise negative other than what is  stated in HPI   BP 138/89   Pulse 67   Temp 98.6 F (37 C) (Oral)   Ht 5\' 5"  (1.651 m)   Wt 180 lb 9.6 oz (81.9 kg)   SpO2 97%   BMI 30.05 kg/m   Physical Exam Vitals and nursing note reviewed. Exam conducted with a chaperone present.  Constitutional:      Appearance: Normal appearance. He is not ill-appearing.  Cardiovascular:     Rate and Rhythm: Normal rate and regular rhythm.     Heart sounds: No murmur heard. Pulmonary:     Effort: Pulmonary effort is normal.     Breath sounds: Normal breath sounds.  Abdominal:     General: Abdomen is flat. There is no distension.     Palpations: Abdomen is soft. There is no mass.     Tenderness: There is no abdominal tenderness. There is no guarding or rebound.     Hernia: No hernia is present.  Musculoskeletal:        General: No swelling or tenderness. Normal range of motion.     Cervical back: Normal range of motion and neck supple. No rigidity or tenderness.  Skin:    General: Skin is warm and dry.     Capillary Refill: Capillary refill takes less than 2 seconds.  Neurological:     General: No focal deficit present.     Mental Status: He is alert and oriented to person,  place, and time.  Psychiatric:        Mood and Affect: Mood normal.        Behavior: Behavior normal.        Thought Content: Thought content normal.        Judgment: Judgment normal.    Assessment/Plan: 43 year old male with right a ascending colon mass unable to be resected endoscopically.  Given the size I definitely recommend right colectomy.  Do think that he will be a candidate for minimally invasive approach.  She discussed with the patient in detail. THe Risks, benefits and possible complications including but not limited to, bleeding, infection, anastomotic leak, the need for ostomy, were described as well as hospitalization and postoperative course. They understand and wished to proceed. Please note that I spent 40 minutes in this encounter including  personally reviewing medical record, personally reviewing imaging studies, coordinating his care, placing orders and performing documentation  Sterling Big, MD [facility] General Surgeon

## 2023-08-17 NOTE — Telephone Encounter (Signed)
 Left message for patient to call.  Please inform him of the following regarding scheduled surgery with Dr. Everlene Farrier.   Pre-Admission date/time, and Surgery date at [facility].  Surgery Date: 09/05/23 Preadmission Testing Date: 08/28/23 (phone 1p-4p)  Also patient will need to call at [phone], between 1-3:00pm the day before surgery, to find out what time to arrive for surgery.

## 2023-08-17 NOTE — Progress Notes (Signed)
 BP 121/73 (BP Location: Left Arm, Patient Position: Sitting, Cuff Size: Large)   Temp 98.4 F (36.9 C) (Oral)    Subjective:    Patient ID: Rodney Myers, male    DOB: 12-Mar-1981, 43 y.o.   MRN: [id number]  HPI: Rodney Myers is a 43 y.o. male  Chief Complaint  Patient presents with   Hospitalization Follow-up    GI related.  colonic adenocarcinoma found on CT. Scheduled for surgery 09/05/2023.    Fussy    States this is new level and was there prior to this diagnosis. 12/13 was his last cigarette. Last smoked pot about a week ago.    Transition of Care Hospital Follow up.   Hospital/Facility: [facility] D/C Physician: Dr. Clide Dales D/C Date: 08/08/23  Records Requested: 08/17/23  Records Received: 08/17/23 Records Reviewed: 08/17/23  Diagnoses on Discharge: AKI (acute kidney injury) (HCC)   Acute gastroenteritis   Colonic mass   Hypokalemia   Dyslipidemia   GERD without esophagitis   Anxiety and depression  Date of interactive Contact within 48 hours of discharge: 08/08/23 Contact was through: phone  Date of 7 day or 14 day face-to-face visit: 08/17/23   within 14 days  Outpatient Encounter Medications as of 08/17/2023  Medication Sig   atorvastatin (LIPITOR) 40 MG tablet Take 1 tablet (40 mg total) by mouth daily.   metroNIDAZOLE (FLAGYL) 500 MG tablet Take 2 tabs (1000 mg total) at 8 am, again at 2 pm, and again at 8 pm the day prior to surgery.   neomycin (MYCIFRADIN) 500 MG tablet Take 2 tabs (1000 mg total) at 8 am, again at 2 pm, and again at 8 pm the day prior to surgery.   nicotine (NICODERM CQ) 7 mg/24hr patch Place 1 patch (7 mg total) onto the skin daily.   omeprazole (PRILOSEC) 20 MG capsule Take 1-2 capsules (20-40 mg total) by mouth daily.   polyethylene glycol powder (MIRALAX) 17 GM/SCOOP powder Mix with 64 ounces of Gatorade (no red) the day prior to surgery and drink as directed.   [DISCONTINUED] citalopram (CELEXA) 20 MG tablet Take 1 tablet (20 mg total) by  mouth daily.   citalopram (CELEXA) 40 MG tablet Take 1 tablet (40 mg total) by mouth daily.   No facility-administered encounter medications on file as of 08/17/2023.  Per Hospitalist: "Hospital Course: Rodney Myers is a 43 y.o. male with medical history significant for anxiety, GERD, essential hypertension and urolithiasis, who presented to the emergency room with acute onset of intractable nausea with vomiting as well as diarrhea that started yesterday.  He admits to associated abdominal pain that started few hours before he came to the ER.  CT scan of the abdomen pelvis revealed 4.7 cm enhancing polypoid soft tissue mass in the ascending colon, highly suspicious for colonic adenocarcinoma.  Patient is admitted to the hospitalist service for further management, GI consultation for colonoscopy.  Patient had colonoscopy and he is noted to have 30 mm polyp, biopsies taken.  Patient is advised to follow-up with surgery clinic next week for further management.  Patient and his wife understand agree with the discharge plan.   Assessment and Plan: * AKI (acute kidney injury) (HCC) Metabolic acidosis Prerenal in the setting of dehydration, nausea vomiting and diarrhea Patient got IV hydration, kidney function improved. Metabolic acidosis improved. Patient is able to tolerate diet and is hemodynamically stable to be discharged home.   Acute gastroenteritis Patient's symptoms of diarrhea, nausea and vomiting improved. Patient is  able to tolerate diet. Bland diet advised for the next few days. Follow-up PCP, surgery clinic as scheduled.   Colonic mass GI evaluation is appreciated. Patient had colonoscopy, 30 mm polyp in proximal ascending colon noted, biopsies pending. Follow-up with surgery clinic next week for further management.   Hypokalemia Improved with replacement. Outpatient follow-up with PCP.   Dyslipidemia Continue statin therapy.   Anxiety and depression Continue Celexa    GERD without esophagitis Home dose PPI resumed.   Obesity class I BMI 30.79 Diet, exercise and weight reduction advised."  Diagnostic Tests Reviewed: CLINICAL DATA:  Left lower quadrant abdominal pain, vomiting, diarrhea   EXAM: CT ABDOMEN AND PELVIS WITH CONTRAST   TECHNIQUE: Multidetector CT imaging of the abdomen and pelvis was performed using the standard protocol following bolus administration of intravenous contrast.   RADIATION DOSE REDUCTION: This exam was performed according to the departmental dose-optimization program which includes automated exposure control, adjustment of the mA and/or kV according to patient size and/or use of iterative reconstruction technique.   CONTRAST:  OMNIPAQUE IOHEXOL 300 MG/ML  SOLN   COMPARISON:  03/30/2019   FINDINGS: Lower chest: Lung bases are clear.   Hepatobiliary: Subcentimeter cyst in segment 4 (series 2/image 19), benign.   Gallbladder is unremarkable. No intrahepatic or extrahepatic ductal dilatation.   Pancreas: Within normal limits.   Spleen: Within normal limits.   Adrenals/Urinary Tract: Adrenal glands are within normal limits.   Kidneys are within normal limits.  No hydronephrosis.   Mildly thick-walled bladder, although underdistended.   Stomach/Bowel: Stomach is within normal limits.   No evidence of bowel obstruction.   Normal appendix (series 2/image 55).   3.5 x 4.7 x 4.1 cm enhancing polypoid soft tissue mass in the ascending colon (coronal image 50), highly suspicious for colonic adenocarcinoma. No pericolonic extension.   Vascular/Lymphatic: No evidence of abdominal aortic aneurysm.   No suspicious abdominopelvic lymphadenopathy. Specifically, no suspicious ileocolic nodes.   Reproductive: Prostate is unremarkable.   Other: No abdominopelvic ascites.   Musculoskeletal: Visualized osseous structures are within normal limits.   IMPRESSION: No CT findings to account for the  patient's left lower quadrant abdominal pain.   4.7 cm enhancing polypoid soft tissue mass in the ascending colon, highly suspicious for colonic adenocarcinoma. GI consultation for colonoscopy is suggested.   No evidence of metastatic disease.  Disposition: Home  Consults: GI, surgery  Discharge Instructions:  PCP follow up in 1 week. Surgery follow up as scheduled 08/16/23.  Disease/illness Education: Discussed today  Home Health/Community Services Discussions/Referrals: N/A  Establishment or re-establishment of referral orders for community resources: N/A  Discussion with other health care providers: N/A  Assessment and Support of treatment regimen adherence: Excellent  Appointments Coordinated with: Patient  Education for self-management, independent living, and ADLs: Discussed today  Since getting out of the hospital, Rodney Myers has been feeling well. He notes that he is hot nausous anymore. No abdominal pain. He is feeling more like himself. Saw general surgery yesterday and is scheduled for a colectomy at the end of the month.  DEPRESSION- quite smoking both tobacco and marijuana. Has been irritable since then Mood status: exacerbated Satisfied with current treatment?: no Symptom severity: moderate  Duration of current treatment :  weeks Side effects: no Medication compliance: excellent compliance Psychotherapy/counseling: no  Previous psychiatric medications: celexa Depressed mood: yes Anxious mood: yes Anhedonia: no Significant weight loss or gain: no Insomnia: no  Fatigue: yes Feelings of worthlessness or guilt: no Impaired concentration/indecisiveness: no Suicidal  ideations: no Hopelessness: no Crying spells: no    08/17/2023    3:48 PM 03/21/2023    8:08 AM 02/23/2023    3:16 PM 09/19/2022    4:08 PM 03/28/2022   11:31 AM  Depression screen PHQ 2/9  Decreased Interest 1 1 1  0 0  Down, Depressed, Hopeless 2 1 1  0 0  PHQ - 2 Score 3 2 2  0 0  Altered sleeping  2 0 1 0 0  Tired, decreased energy 2 1 1 1  0  Change in appetite 2 0 1 0 0  Feeling bad or failure about yourself  2 1 1  0 0  Trouble concentrating 1 0 0 0 0  Moving slowly or fidgety/restless 0 0 0 0 0  Suicidal thoughts 0 0 1 0 0  PHQ-9 Score 12 4 7 1  0  Difficult doing work/chores  Not difficult at all Not difficult at all Not difficult at all Not difficult at all     Relevant past medical, surgical, family and social history reviewed and updated as indicated. Interim medical history since our last visit reviewed. Allergies and medications reviewed and updated.  Review of Systems  Constitutional: Negative.   Respiratory: Negative.    Cardiovascular: Negative.   Gastrointestinal: Negative.   Musculoskeletal: Negative.   Skin: Negative.   Psychiatric/Behavioral:  Positive for dysphoric mood. Negative for agitation, behavioral problems, confusion, decreased concentration, hallucinations, self-injury, sleep disturbance and suicidal ideas. The patient is nervous/anxious. The patient is not hyperactive.     Per HPI unless specifically indicated above     Objective:    BP 121/73 (BP Location: Left Arm, Patient Position: Sitting, Cuff Size: Large)   Temp 98.4 F (36.9 C) (Oral)   Wt Readings from Last 3 Encounters:  08/16/23 180 lb 9.6 oz (81.9 kg)  08/07/23 185 lb (83.9 kg)  03/21/23 181 lb (82.1 kg)    Physical Exam Vitals and nursing note reviewed.  Constitutional:      General: He is not in acute distress.    Appearance: Normal appearance. He is not ill-appearing, toxic-appearing or diaphoretic.  HENT:     Head: Normocephalic and atraumatic.     Right Ear: External ear normal.     Left Ear: External ear normal.     Nose: Nose normal.     Mouth/Throat:     Mouth: Mucous membranes are moist.     Pharynx: Oropharynx is clear.  Eyes:     General: No scleral icterus.       Right eye: No discharge.        Left eye: No discharge.     Extraocular Movements: Extraocular  movements intact.     Conjunctiva/sclera: Conjunctivae normal.     Pupils: Pupils are equal, round, and reactive to light.  Cardiovascular:     Rate and Rhythm: Normal rate and regular rhythm.     Pulses: Normal pulses.     Heart sounds: Normal heart sounds. No murmur heard.    No friction rub. No gallop.  Pulmonary:     Effort: Pulmonary effort is normal. No respiratory distress.     Breath sounds: Normal breath sounds. No stridor. No wheezing, rhonchi or rales.  Chest:     Chest wall: No tenderness.  Abdominal:     General: Abdomen is flat. Bowel sounds are normal. There is no distension.     Palpations: Abdomen is soft. There is no mass.     Tenderness: There is no abdominal tenderness. There  is no right CVA tenderness, left CVA tenderness, guarding or rebound.     Hernia: No hernia is present.  Musculoskeletal:        General: Normal range of motion.     Cervical back: Normal range of motion and neck supple.  Skin:    General: Skin is warm and dry.     Capillary Refill: Capillary refill takes less than 2 seconds.     Coloration: Skin is not jaundiced or pale.     Findings: No bruising, erythema, lesion or rash.  Neurological:     General: No focal deficit present.     Mental Status: He is alert and oriented to person, place, and time. Mental status is at baseline.  Psychiatric:        Mood and Affect: Mood normal.        Behavior: Behavior normal.        Thought Content: Thought content normal.        Judgment: Judgment normal.     Results for orders placed or performed during the hospital encounter of 08/07/23  Comprehensive metabolic panel   Collection Time: 08/07/23  1:13 AM  Result Value Ref Range   Sodium 136 135 - 145 mmol/L   Potassium 3.2 (L) 3.5 - 5.1 mmol/L   Chloride 102 98 - 111 mmol/L   CO2 16 (L) 22 - 32 mmol/L   Glucose, Bld 208 (H) 70 - 99 mg/dL   BUN 14 6 - 20 mg/dL   Creatinine, Ser 1.61 (H) 0.61 - 1.24 mg/dL   Calcium 09.6 (H) 8.9 - 10.3 mg/dL    Total Protein 9.4 (H) 6.5 - 8.1 g/dL   Albumin 5.3 (H) 3.5 - 5.0 g/dL   AST 32 15 - 41 U/L   ALT 46 (H) 0 - 44 U/L   Alkaline Phosphatase 116 38 - 126 U/L   Total Bilirubin 1.5 (H) 0.0 - 1.2 mg/dL   GFR, Estimated 52 (L) >60 mL/min   Anion gap 18 (H) 5 - 15  CBC with Differential   Collection Time: 08/07/23  1:13 AM  Result Value Ref Range   WBC 14.9 (H) 4.0 - 10.5 K/uL   RBC 6.52 (H) 4.22 - 5.81 MIL/uL   Hemoglobin 18.0 (H) 13.0 - 17.0 g/dL   HCT 04.5 (H) 40.9 - 81.1 %   MCV 80.1 80.0 - 100.0 fL   MCH 27.6 26.0 - 34.0 pg   MCHC 34.5 30.0 - 36.0 g/dL   RDW 91.4 78.2 - 95.6 %   Platelets 259 150 - 400 K/uL   nRBC 0.0 0.0 - 0.2 %   Neutrophils Relative % 85 %   Neutro Abs 13.0 (H) 1.7 - 7.7 K/uL   Lymphocytes Relative 5 %   Lymphs Abs 0.7 0.7 - 4.0 K/uL   Monocytes Relative 4 %   Monocytes Absolute 0.5 0.1 - 1.0 K/uL   Eosinophils Relative 4 %   Eosinophils Absolute 0.5 0.0 - 0.5 K/uL   Basophils Relative 1 %   Basophils Absolute 0.1 0.0 - 0.1 K/uL   Immature Granulocytes 1 %   Abs Immature Granulocytes 0.08 (H) 0.00 - 0.07 K/uL  Resp panel by RT-PCR (RSV, Flu A&B, Covid) Anterior Nasal Swab   Collection Time: 08/07/23  1:20 AM   Specimen: Anterior Nasal Swab  Result Value Ref Range   SARS Coronavirus 2 by RT PCR NEGATIVE NEGATIVE   Influenza A by PCR NEGATIVE NEGATIVE   Influenza B by PCR NEGATIVE NEGATIVE  Resp Syncytial Virus by PCR NEGATIVE NEGATIVE  Urinalysis, w/ Reflex to Culture (Infection Suspected) -Urine, Clean Catch   Collection Time: 08/07/23  4:56 AM  Result Value Ref Range   Specimen Source URINE, CLEAN CATCH    Color, Urine YELLOW (A) YELLOW   APPearance CLEAR (A) CLEAR   Specific Gravity, Urine >1.046 (H) 1.005 - 1.030   pH 5.0 5.0 - 8.0   Glucose, UA NEGATIVE NEGATIVE mg/dL   Hgb urine dipstick NEGATIVE NEGATIVE   Bilirubin Urine NEGATIVE NEGATIVE   Ketones, ur NEGATIVE NEGATIVE mg/dL   Protein, ur NEGATIVE NEGATIVE mg/dL   Nitrite NEGATIVE  NEGATIVE   Leukocytes,Ua NEGATIVE NEGATIVE   RBC / HPF 0-5 0 - 5 RBC/hpf   WBC, UA 0-5 0 - 5 WBC/hpf   Bacteria, UA NONE SEEN NONE SEEN   Squamous Epithelial / HPF 0-5 0 - 5 /HPF   Mucus PRESENT    Hyaline Casts, UA PRESENT   HIV Antibody (routine testing w rflx)   Collection Time: 08/07/23  5:18 AM  Result Value Ref Range   HIV Screen 4th Generation wRfx Non Reactive Non Reactive  Basic metabolic panel   Collection Time: 08/07/23  5:18 AM  Result Value Ref Range   Sodium 139 135 - 145 mmol/L   Potassium 3.8 3.5 - 5.1 mmol/L   Chloride 103 98 - 111 mmol/L   CO2 22 22 - 32 mmol/L   Glucose, Bld 143 (H) 70 - 99 mg/dL   BUN 15 6 - 20 mg/dL   Creatinine, Ser 9.60 (H) 0.61 - 1.24 mg/dL   Calcium 9.7 8.9 - 45.4 mg/dL   GFR, Estimated >09 >81 mL/min   Anion gap 14 5 - 15  CBC   Collection Time: 08/07/23  5:18 AM  Result Value Ref Range   WBC 12.6 (H) 4.0 - 10.5 K/uL   RBC 5.62 4.22 - 5.81 MIL/uL   Hemoglobin 15.7 13.0 - 17.0 g/dL   HCT 19.1 47.8 - 29.5 %   MCV 82.6 80.0 - 100.0 fL   MCH 27.9 26.0 - 34.0 pg   MCHC 33.8 30.0 - 36.0 g/dL   RDW 62.1 30.8 - 65.7 %   Platelets 190 150 - 400 K/uL   nRBC 0.0 0.0 - 0.2 %  Surgical pathology   Collection Time: 08/08/23 12:00 AM  Result Value Ref Range   SURGICAL PATHOLOGY      SURGICAL PATHOLOGY [facility] [address], [address] [place], [place] [address] Telephone [phone] or [phone] Fax [phone]  REPORT OF SURGICAL PATHOLOGY   Accession #: [id number] Patient Name: Rodney Myers, Rodney Myers Visit # : [id number]  MRN: [id number] Physician: [name] DOB/Age [birth year] (Age: 75) Gender: M Collected Date: 08/08/2023 Received Date: 08/08/2023  FINAL DIAGNOSIS       1. Ascending Colon Biopsy, CBX mass :       - TUBULAR ADENOMA.      - NEGATIVE FOR HIGH-GRADE DYSPLASIA AND MALIGNANCY.      - DEEPER SECTIONS WERE EXAMINED.       DATE SIGNED OUT: 08/09/2023 ELECTRONIC SIGNATURE : Oneita Kras  Md, Delice Bison , Pathologist, Electronic Signature  MICROSCOPIC DESCRIPTION  CASE COMMENTS STAINS USED IN DIAGNOSIS: H&E *RECUT 3 SLIDES *RECUT 3 SLIDES *RECUT 3 SLIDES    CLINICAL HISTORY  SPECIMEN(S) OBTAINED 1. Ascending Colon Biopsy, CBX Mass  SPECIMEN COMMENTS: SPECIMEN CLINICAL INFORMATION: 1. Colon mas s    Gross Description 1. "CBX ascending colon mass", received in formalin is  a 0.8 x 0.5 x 0.1 cm aggregate of multiple tan-gray tissue fragments. The specimen is filtered and submitted in toto in 1 block (1A).      [facility] 08/08/2023        Report signed out from the following location(s) . [facility] [address] [place], [place] [address] CLIA #: [id number]  [facility] [address] [place], [place] [address] CLIA #: [id number]   CBC   Collection Time: 08/08/23  6:08 AM  Result Value Ref Range   WBC 8.1 4.0 - 10.5 K/uL   RBC 4.73 4.22 - 5.81 MIL/uL   Hemoglobin 13.1 13.0 - 17.0 g/dL   HCT 57.8 (L) 46.9 - 62.9 %   MCV 82.2 80.0 - 100.0 fL   MCH 27.7 26.0 - 34.0 pg   MCHC 33.7 30.0 - 36.0 g/dL   RDW 52.8 41.3 - 24.4 %   Platelets 173 150 - 400 K/uL   nRBC 0.0 0.0 - 0.2 %  Basic metabolic panel   Collection Time: 08/08/23  6:08 AM  Result Value Ref Range   Sodium 139 135 - 145 mmol/L   Potassium 3.6 3.5 - 5.1 mmol/L   Chloride 113 (H) 98 - 111 mmol/L   CO2 20 (L) 22 - 32 mmol/L   Glucose, Bld 104 (H) 70 - 99 mg/dL   BUN 10 6 - 20 mg/dL   Creatinine, Ser 0.10 0.61 - 1.24 mg/dL   Calcium 8.2 (L) 8.9 - 10.3 mg/dL   GFR, Estimated >27 >25 mL/min   Anion gap 6 5 - 15  CEA   Collection Time: 08/08/23  6:08 AM  Result Value Ref Range   CEA 1.3 0.0 - 4.7 ng/mL      Assessment & Plan:   Problem List Items Addressed This Visit       Genitourinary   AKI (acute kidney injury) (HCC)   Will recheck next visit. Await results.         Other   Anxiety   Will increase his celexa to 40mg  and recheck in about 3  weeks. Call with any concerns.       Relevant Medications   citalopram (CELEXA) 40 MG tablet   Depression, recurrent (HCC)   Will increase his celexa to 40mg  and recheck in about 3 weeks. Call with any concerns.       Relevant Medications   citalopram (CELEXA) 40 MG tablet   Colonic mass - Primary   To have colectomy in about 3 weeks. Continue to follow with general surgery. Continue to monitor. Call with any concerns.       Dyslipidemia   Has not been taking his medicine. Will hold until after his surgery. Call with any concerns.         Follow up plan: Return in about 3 weeks (around 09/07/2023).

## 2023-08-18 NOTE — Assessment & Plan Note (Signed)
 Has not been taking his medicine. Will hold until after his surgery. Call with any concerns.

## 2023-08-18 NOTE — Assessment & Plan Note (Signed)
 Will recheck next visit. Await results.

## 2023-08-18 NOTE — Assessment & Plan Note (Signed)
 Will increase his celexa to 40mg  and recheck in about 3 weeks. Call with any concerns.

## 2023-08-18 NOTE — Assessment & Plan Note (Signed)
 To have colectomy in about 3 weeks. Continue to follow with general surgery. Continue to monitor. Call with any concerns.

## 2023-08-28 ENCOUNTER — Encounter
Admission: RE | Admit: 2023-08-28 | Discharge: 2023-08-28 | Disposition: A | Discharge status: Home / Self Care | Payer: PRIVATE HEALTH INSURANCE | Source: Ambulatory Visit | Attending: Surgery | Admitting: Surgery

## 2023-08-28 ENCOUNTER — Other Ambulatory Visit: Payer: Self-pay

## 2023-08-28 VITALS — Ht 65.0 in | Wt 181.0 lb

## 2023-08-28 DIAGNOSIS — Z01812 Encounter for preprocedural laboratory examination: Secondary | ICD-10-CM

## 2023-08-28 NOTE — Patient Instructions (Addendum)
 Your procedure is scheduled on: Tuesday 09/05/23 Report to the Registration Desk on the 1st floor of the [facility]. To find out your arrival time, please call [phone] between 1PM - 3PM on: Monday 09/04/23  If your arrival time is 6:00 am, do not arrive before that time as the [facility] entrance doors do not open until 6:00 am.  REMEMBER: Instructions that are not followed completely may result in serious medical risk, up to and including death; or upon the discretion of your surgeon and anesthesiologist your surgery may need to be rescheduled.  Do not eat food after midnight the night before surgery. Follow instructions from surgeon for a bowel prep prior to your surgery.  No gum chewing or hard candies.  You may however, drink CLEAR liquids up to 2 hours before you are scheduled to arrive for your surgery. Do not drink anything within 2 hours of your scheduled arrival time.  Clear liquids include: - water  - apple juice without pulp Do NOT drink anything that is not on this list.   One week prior to surgery: Stop Anti-inflammatories (NSAIDS) such as Advil, Aleve, Ibuprofen, Motrin, Naproxen, Naprosyn and Aspirin based products such as Excedrin, Goody's Powder, BC Powder. Stop ANY OVER THE COUNTER supplements until after surgery.  You may however, continue to take Tylenol if needed for pain up until the day of surgery.   Continue taking all of your other prescription medications up until the day of surgery.  ON THE DAY OF SURGERY ONLY TAKE THESE MEDICATIONS WITH SIPS OF WATER:  citalopram (CELEXA) 40 MG    No Alcohol for 24 hours before or after surgery.  No Smoking including e-cigarettes for 24 hours before surgery.  No chewable tobacco products for at least 6 hours before surgery.  No nicotine patches on the day of surgery.  Do not use any "recreational" drugs for at least a week (preferably 2 weeks) before your surgery.  Please be advised that the combination of  cocaine and anesthesia may have negative outcomes, up to and including death. If you test positive for cocaine, your surgery will be cancelled.  On the morning of surgery brush your teeth with toothpaste and water, you may rinse your mouth with mouthwash if you wish. Do not swallow any toothpaste or mouthwash.  Use CHG Soap as directed on instruction sheet.  Do not wear jewelry, make-up, hairpins, clips or nail polish.  For welded (permanent) jewelry: bracelets, anklets, waist bands, etc.  Please have this removed prior to surgery.  If it is not removed, there is a chance that hospital personnel will need to cut it off on the day of surgery.  Do not wear lotions, powders, or perfumes.   Do not shave body hair from the neck down 48 hours before surgery.  Contact lenses, hearing aids and dentures may not be worn into surgery.  Do not bring valuables to the hospital. [facility] is not responsible for any missing/lost belongings or valuables.    Notify your doctor if there is any change in your medical condition (cold, fever, infection).  Wear comfortable clothing (specific to your surgery type) to the hospital.  After surgery, you can help prevent lung complications by doing breathing exercises.  Take deep breaths and cough every 1-2 hours. Your doctor may order a device called an Incentive Spirometer to help you take deep breaths. When coughing or sneezing, hold a pillow firmly against your incision with both hands. This is called "splinting." Doing  this helps protect your incision. It also decreases belly discomfort.  If you are being admitted to the hospital overnight, leave your suitcase in the car. After surgery it may be brought to your room.  In case of increased patient census, it may be necessary for you, the patient, to continue your postoperative care in the Same Day Surgery department.  If you are being discharged the day of surgery, you will not be allowed to drive  home. You will need a responsible individual to drive you home and stay with you for 24 hours after surgery.   If you are taking public transportation, you will need to have a responsible individual with you.  Please call the Pre-admissions Testing Dept. at [phone] if you have any questions about these instructions.  Surgery Visitation Policy:  Patients having surgery or a procedure may have two visitors.  Children under the age of 56 must have an adult with them who is not the patient.  Temporary Visitor Restrictions Due to increasing cases of flu, RSV and COVID-19: Children ages 22 and under will not be able to visit patients in [facility] hospitals under most circumstances.  Inpatient Visitation:    Visiting hours are 7 a.m. to 8 p.m. Up to four visitors are allowed at one time in a patient room. The visitors may rotate out with other people during the day.  One visitor age 27 or older may stay with the patient overnight and must be in the room by 8 p.m.     Preparing for Surgery with CHLORHEXIDINE GLUCONATE (CHG) Soap  Chlorhexidine Gluconate (CHG) Soap  o An antiseptic cleaner that kills germs and bonds with the skin to continue killing germs even after washing  o Used for showering the night before surgery and morning of surgery  Before surgery, you can play an important role by reducing the number of germs on your skin.  CHG (Chlorhexidine gluconate) soap is an antiseptic cleanser which kills germs and bonds with the skin to continue killing germs even after washing.  Please do not use if you have an allergy to CHG or antibacterial soaps. If your skin becomes reddened/irritated stop using the CHG.  1. Shower the NIGHT BEFORE SURGERY and the MORNING OF SURGERY with CHG soap.  2. If you choose to wash your hair, wash your hair first as usual with your normal shampoo.  3. After shampooing, rinse your hair and body thoroughly to remove the shampoo.  4. Use CHG  as you would any other liquid soap. You can apply CHG directly to the skin and wash gently with a scrungie or a clean washcloth.  5. Apply the CHG soap to your body only from the neck down. Do not use on open wounds or open sores. Avoid contact with your eyes, ears, mouth, and genitals (private parts). Wash face and genitals (private parts) with your normal soap.  6. Wash thoroughly, paying special attention to the area where your surgery will be performed.  7. Thoroughly rinse your body with warm water.  8. Do not shower/wash with your normal soap after using and rinsing off the CHG soap.  9. Pat yourself dry with a clean towel.  10. Wear clean pajamas to bed the night before surgery.  12. Place clean sheets on your bed the night of your first shower and do not sleep with pets.  13. Shower again with the CHG soap on the day of surgery prior to arriving at the hospital.  14. Do not apply any deodorants/lotions/powders.  15. Please wear clean clothes to the hospital.

## 2023-08-30 ENCOUNTER — Encounter
Admission: RE | Admit: 2023-08-30 | Discharge: 2023-08-30 | Disposition: A | Discharge status: Home / Self Care | Payer: PRIVATE HEALTH INSURANCE | Source: Ambulatory Visit | Attending: Surgery | Admitting: Surgery

## 2023-08-30 DIAGNOSIS — Z01812 Encounter for preprocedural laboratory examination: Secondary | ICD-10-CM | POA: Insufficient documentation

## 2023-08-30 LAB — TYPE AND SCREEN
ABO/RH(D): A POS
Antibody Screen: NEGATIVE

## 2023-09-04 ENCOUNTER — Encounter: Payer: Self-pay | Admitting: Family Medicine

## 2023-09-04 ENCOUNTER — Telehealth (INDEPENDENT_AMBULATORY_CARE_PROVIDER_SITE_OTHER): Payer: PRIVATE HEALTH INSURANCE | Admitting: Family Medicine

## 2023-09-04 DIAGNOSIS — F419 Anxiety disorder, unspecified: Secondary | ICD-10-CM | POA: Diagnosis not present

## 2023-09-04 DIAGNOSIS — F339 Major depressive disorder, recurrent, unspecified: Secondary | ICD-10-CM

## 2023-09-04 DIAGNOSIS — F515 Nightmare disorder: Secondary | ICD-10-CM

## 2023-09-04 MED ORDER — CITALOPRAM HYDROBROMIDE 40 MG PO TABS
40.0000 mg | ORAL_TABLET | Freq: Every day | ORAL | 1 refills | Status: DC
Start: 2023-09-04 — End: 2023-11-14

## 2023-09-04 MED ORDER — CHLORHEXIDINE GLUCONATE CLOTH 2 % EX PADS
6.0000 | MEDICATED_PAD | Freq: Once | CUTANEOUS | Status: DC
Start: 2023-09-04 — End: 2023-09-05

## 2023-09-04 MED ORDER — ORAL CARE MOUTH RINSE: 15.0000 mL | Freq: Once | OROMUCOSAL | Status: AC

## 2023-09-04 MED ORDER — ACETAMINOPHEN 500 MG PO TABS
1000.0000 mg | ORAL_TABLET | ORAL | Status: AC
Start: 2023-09-05 — End: 2023-09-06
  Administered 2023-09-05: 1000 mg via ORAL

## 2023-09-04 MED ORDER — CHLORHEXIDINE GLUCONATE 0.12 % MT SOLN
15.0000 mL | Freq: Once | OROMUCOSAL | Status: AC
  Administered 2023-09-05: 15 mL via OROMUCOSAL

## 2023-09-04 MED ORDER — PRAZOSIN HCL 1 MG PO CAPS: 1.0000 mg | ORAL_CAPSULE | Freq: Every day | ORAL | 1 refills | Status: DC

## 2023-09-04 MED ORDER — ALVIMOPAN 12 MG PO CAPS
12.0000 mg | ORAL_CAPSULE | ORAL | Status: AC
  Administered 2023-09-05: 12 mg via ORAL

## 2023-09-04 MED ORDER — CHLORHEXIDINE GLUCONATE CLOTH 2 % EX PADS
6.0000 | MEDICATED_PAD | Freq: Once | CUTANEOUS | Status: AC
Start: 2023-09-04 — End: 2023-09-05
  Administered 2023-09-05: 6 via TOPICAL

## 2023-09-04 MED ORDER — GABAPENTIN 300 MG PO CAPS
300.0000 mg | ORAL_CAPSULE | ORAL | Status: AC
  Administered 2023-09-05: 300 mg via ORAL

## 2023-09-04 MED ORDER — SODIUM CHLORIDE 0.9 % IV SOLN
2.0000 g | INTRAVENOUS | Status: AC
  Administered 2023-09-05: 2 g via INTRAVENOUS

## 2023-09-04 MED ORDER — CELECOXIB 200 MG PO CAPS
200.0000 mg | ORAL_CAPSULE | ORAL | Status: AC
Start: 2023-09-05 — End: 2023-09-06
  Administered 2023-09-05: 200 mg via ORAL

## 2023-09-04 MED ORDER — LACTATED RINGERS IV SOLN: INTRAVENOUS | Status: DC

## 2023-09-04 NOTE — Progress Notes (Signed)
 There were no vitals taken for this visit.   Subjective:    Patient ID: Rodney Myers, male    DOB: September 29, 1980, 43 y.o.   MRN: [id number]  HPI: Rodney Myers is a 43 y.o. male  Chief Complaint  Patient presents with   Depression    Pt states he has not been sleeping very well due to having a lot of dreams lately    ANXIETY/DEPRESSION Duration: chronic Status:better Anxious mood: yes  Excessive worrying: no Irritability: no  Sweating: no Nausea: no Palpitations:no Hyperventilation: no Panic attacks: no Agoraphobia: no  Obscessions/compulsions: no Depressed mood: yes    09/04/2023   10:16 AM 08/17/2023    3:48 PM 03/21/2023    8:08 AM 02/23/2023    3:16 PM 09/19/2022    4:08 PM  Depression screen PHQ 2/9  Decreased Interest 0 1 1 1  0  Down, Depressed, Hopeless 1 2 1 1  0  PHQ - 2 Score 1 3 2 2  0  Altered sleeping 3 2 0 1 0  Tired, decreased energy 0 2 1 1 1   Change in appetite 0 2 0 1 0  Feeling bad or failure about yourself  1 2 1 1  0  Trouble concentrating 0 1 0 0 0  Moving slowly or fidgety/restless 0 0 0 0 0  Suicidal thoughts 0 0 0 1 0  PHQ-9 Score 5 12 4 7 1   Difficult doing work/chores   Not difficult at all Not difficult at all Not difficult at all      09/04/2023   10:17 AM 08/17/2023    3:48 PM 03/21/2023    8:08 AM 02/23/2023    3:16 PM  GAD 7 : Generalized Anxiety Score  Nervous, Anxious, on Edge 2 2 1 1   Control/stop worrying 1 2 0 1  Worry too much - different things 1 2 0 1  Trouble relaxing 1 2 1 1   Restless 1 2 1 1   Easily annoyed or irritable 3 3 1 1   Afraid - awful might happen 0 2 0 1  Total GAD 7 Score 9 15 4 7   Anxiety Difficulty  Somewhat difficult  Somewhat difficult   Anhedonia: no Weight changes: no Insomnia: no   Hypersomnia: no Fatigue/loss of energy: yes Feelings of worthlessness: no Feelings of guilt: no Impaired concentration/indecisiveness: no Suicidal ideations: no  Crying spells: no Recent Stressors/Life Changes:  no   Relationship problems: no   Family stress: no     Financial stress: no    Job stress: no    Recent death/loss: no   Relevant past medical, surgical, family and social history reviewed and updated as indicated. Interim medical history since our last visit reviewed. Allergies and medications reviewed and updated.  Review of Systems  Constitutional: Negative.   Respiratory: Negative.    Musculoskeletal: Negative.   Psychiatric/Behavioral:  Positive for dysphoric mood and sleep disturbance. Negative for agitation, behavioral problems, confusion, decreased concentration, hallucinations, self-injury and suicidal ideas. The patient is nervous/anxious. The patient is not hyperactive.     Per HPI unless specifically indicated above     Objective:    There were no vitals taken for this visit.  Wt Readings from Last 3 Encounters:  08/28/23 181 lb (82.1 kg)  08/16/23 180 lb 9.6 oz (81.9 kg)  08/07/23 185 lb (83.9 kg)    Physical Exam Vitals and nursing note reviewed.  Constitutional:      General: He is not in acute distress.  Appearance: Normal appearance. He is not ill-appearing, toxic-appearing or diaphoretic.  HENT:     Head: Normocephalic and atraumatic.     Right Ear: External ear normal.     Left Ear: External ear normal.     Nose: Nose normal.     Mouth/Throat:     Mouth: Mucous membranes are moist.     Pharynx: Oropharynx is clear.  Eyes:     General: No scleral icterus.       Right eye: No discharge.        Left eye: No discharge.     Conjunctiva/sclera: Conjunctivae normal.     Pupils: Pupils are equal, round, and reactive to light.  Pulmonary:     Effort: Pulmonary effort is normal. No respiratory distress.     Comments: Speaking in full sentences Musculoskeletal:        General: Normal range of motion.     Cervical back: Normal range of motion.  Skin:    Coloration: Skin is not jaundiced or pale.     Findings: No bruising, erythema, lesion or rash.   Neurological:     Mental Status: He is alert and oriented to person, place, and time. Mental status is at baseline.  Psychiatric:        Mood and Affect: Mood normal.        Behavior: Behavior normal.        Thought Content: Thought content normal.        Judgment: Judgment normal.     Results for orders placed or performed during the hospital encounter of 08/30/23  Type and screen [facility]   Collection Time: 08/30/23  3:11 PM  Result Value Ref Range   ABO/RH(D) A POS    Antibody Screen NEG    Sample Expiration 09/13/2023,2359    Extend sample reason      NO TRANSFUSIONS OR PREGNANCY IN THE PAST 3 MONTHS Performed at [facility], [address] [place], [place] [address]       Assessment & Plan:   Problem List Items Addressed This Visit       Other   Anxiety   Improved on current dose. Continue current regimen. Continue to monitor. Refills given.       Relevant Medications   citalopram (CELEXA) 40 MG tablet   Depression, recurrent (HCC)   Improved on current dose. Continue current regimen. Continue to monitor. Refills given.       Relevant Medications   citalopram (CELEXA) 40 MG tablet   Other Visit Diagnoses       Nightmares    -  Primary   Will start prazosin after surgery to help with nightmares. Follow up 1 month.   Relevant Medications   citalopram (CELEXA) 40 MG tablet        Follow up plan: Return in about 4 weeks (around 10/02/2023).   This visit was completed via video visit through MyChart due to the restrictions of the COVID-19 pandemic. All issues as above were discussed and addressed. Physical exam was done as above through visual confirmation on video through MyChart. If it was felt that the patient should be evaluated in the office, they were directed there. The patient verbally consented to this visit. Location of the patient: home Location of the provider: work Those involved with this call:  Provider:  Olevia Perches, DO CMA: Arnetha Gula, CMA Front Desk/Registration: Servando Snare  Time spent on call:  15 minutes with patient face to face  via video conference. More than 50% of this time was spent in counseling and coordination of care. 23 minutes total spent in review of patient's record and preparation of their chart.

## 2023-09-04 NOTE — Assessment & Plan Note (Signed)
 Improved on current dose. Continue current regimen. Continue to monitor. Refills given.

## 2023-09-04 NOTE — Progress Notes (Signed)
 Appointment has been made

## 2023-09-05 ENCOUNTER — Other Ambulatory Visit: Payer: Self-pay

## 2023-09-05 ENCOUNTER — Ambulatory Visit: Payer: PRIVATE HEALTH INSURANCE | Admitting: Family Medicine

## 2023-09-05 ENCOUNTER — Inpatient Hospital Stay: Payer: PRIVATE HEALTH INSURANCE | Admitting: Urgent Care

## 2023-09-05 ENCOUNTER — Encounter: Payer: Self-pay | Admitting: Surgery

## 2023-09-05 ENCOUNTER — Encounter
Admission: RE | Disposition: A | Discharge status: Home / Self Care | Payer: Self-pay | Source: Home / Self Care | Attending: Surgery

## 2023-09-05 ENCOUNTER — Inpatient Hospital Stay
Admission: RE | Admit: 2023-09-05 | Discharge: 2023-09-07 | DRG: 331 | Disposition: A | Discharge status: Home / Self Care | Payer: PRIVATE HEALTH INSURANCE | Attending: Surgery | Admitting: Surgery

## 2023-09-05 ENCOUNTER — Inpatient Hospital Stay: Payer: PRIVATE HEALTH INSURANCE

## 2023-09-05 DIAGNOSIS — K219 Gastro-esophageal reflux disease without esophagitis: Secondary | ICD-10-CM | POA: Diagnosis present

## 2023-09-05 DIAGNOSIS — Z888 Allergy status to other drugs, medicaments and biological substances status: Secondary | ICD-10-CM | POA: Diagnosis not present

## 2023-09-05 DIAGNOSIS — D72829 Elevated white blood cell count, unspecified: Secondary | ICD-10-CM | POA: Diagnosis present

## 2023-09-05 DIAGNOSIS — Z9049 Acquired absence of other specified parts of digestive tract: Principal | ICD-10-CM

## 2023-09-05 DIAGNOSIS — Z9889 Other specified postprocedural states: Secondary | ICD-10-CM | POA: Diagnosis not present

## 2023-09-05 DIAGNOSIS — Z808 Family history of malignant neoplasm of other organs or systems: Secondary | ICD-10-CM

## 2023-09-05 DIAGNOSIS — Z8249 Family history of ischemic heart disease and other diseases of the circulatory system: Secondary | ICD-10-CM

## 2023-09-05 DIAGNOSIS — Z87442 Personal history of urinary calculi: Secondary | ICD-10-CM

## 2023-09-05 DIAGNOSIS — I10 Essential (primary) hypertension: Secondary | ICD-10-CM | POA: Diagnosis present

## 2023-09-05 DIAGNOSIS — Z833 Family history of diabetes mellitus: Secondary | ICD-10-CM | POA: Diagnosis not present

## 2023-09-05 DIAGNOSIS — Z79899 Other long term (current) drug therapy: Secondary | ICD-10-CM | POA: Diagnosis not present

## 2023-09-05 DIAGNOSIS — K6389 Other specified diseases of intestine: Secondary | ICD-10-CM | POA: Diagnosis present

## 2023-09-05 DIAGNOSIS — F419 Anxiety disorder, unspecified: Secondary | ICD-10-CM | POA: Diagnosis present

## 2023-09-05 DIAGNOSIS — Z87891 Personal history of nicotine dependence: Secondary | ICD-10-CM | POA: Diagnosis not present

## 2023-09-05 DIAGNOSIS — C182 Malignant neoplasm of ascending colon: Principal | ICD-10-CM

## 2023-09-05 HISTORY — PX: LAPAROSCOPIC RIGHT COLECTOMY: SHX5925

## 2023-09-05 LAB — ABO/RH: ABO/RH(D): A POS

## 2023-09-05 SURGERY — COLECTOMY, RIGHT, LAPAROSCOPIC
Anesthesia: General | Site: Abdomen

## 2023-09-05 MED ORDER — OXYCODONE HCL 5 MG PO TABS: 5.0000 mg | ORAL_TABLET | Freq: Once | ORAL | Status: DC | PRN

## 2023-09-05 MED ORDER — MIDAZOLAM HCL 2 MG/2ML IJ SOLN
INTRAMUSCULAR | Status: DC | PRN
  Administered 2023-09-05: 2 mg via INTRAVENOUS

## 2023-09-05 MED ORDER — SODIUM CHLORIDE 0.9 % IV SOLN: INTRAVENOUS | Status: AC

## 2023-09-05 MED ORDER — CELECOXIB 200 MG PO CAPS
ORAL_CAPSULE | ORAL | Status: AC
  Filled 2023-09-05: qty 1

## 2023-09-05 MED ORDER — BUPIVACAINE-EPINEPHRINE (PF) 0.5% -1:200000 IJ SOLN
INTRAMUSCULAR | Status: AC
  Filled 2023-09-05: qty 30

## 2023-09-05 MED ORDER — ONDANSETRON HCL 4 MG/2ML IJ SOLN: 4.0000 mg | Freq: Four times a day (QID) | INTRAMUSCULAR | Status: DC | PRN

## 2023-09-05 MED ORDER — FENTANYL CITRATE (PF) 100 MCG/2ML IJ SOLN
INTRAMUSCULAR | Status: DC | PRN
  Administered 2023-09-05 (×2): 50 ug via INTRAVENOUS

## 2023-09-05 MED ORDER — HYDROMORPHONE HCL 1 MG/ML IJ SOLN
INTRAMUSCULAR | Status: AC
  Filled 2023-09-05: qty 1

## 2023-09-05 MED ORDER — KETOROLAC TROMETHAMINE 15 MG/ML IJ SOLN
INTRAMUSCULAR | Status: AC
  Filled 2023-09-05: qty 1

## 2023-09-05 MED ORDER — FENTANYL CITRATE (PF) 100 MCG/2ML IJ SOLN
25.0000 ug | INTRAMUSCULAR | Status: DC | PRN
  Administered 2023-09-05 (×2): 25 ug via INTRAVENOUS

## 2023-09-05 MED ORDER — PROPOFOL 10 MG/ML IV BOLUS
INTRAVENOUS | Status: AC
  Filled 2023-09-05: qty 20

## 2023-09-05 MED ORDER — MIDAZOLAM HCL 2 MG/2ML IJ SOLN
INTRAMUSCULAR | Status: AC
  Filled 2023-09-05: qty 2

## 2023-09-05 MED ORDER — HYDROMORPHONE HCL 1 MG/ML IJ SOLN
1.0000 mg | INTRAMUSCULAR | Status: DC | PRN
  Administered 2023-09-05 (×2): 1 mg via INTRAVENOUS

## 2023-09-05 MED ORDER — KETAMINE HCL 50 MG/5ML IJ SOSY
PREFILLED_SYRINGE | INTRAMUSCULAR | Status: DC | PRN
  Administered 2023-09-05 (×2): 20 mg via INTRAVENOUS
  Administered 2023-09-05: 10 mg via INTRAVENOUS

## 2023-09-05 MED ORDER — ENOXAPARIN SODIUM 40 MG/0.4ML IJ SOSY
40.0000 mg | PREFILLED_SYRINGE | INTRAMUSCULAR | Status: DC
  Administered 2023-09-06: 40 mg via SUBCUTANEOUS
  Filled 2023-09-05: qty 0.4

## 2023-09-05 MED ORDER — FENTANYL CITRATE (PF) 100 MCG/2ML IJ SOLN
INTRAMUSCULAR | Status: AC
  Filled 2023-09-05: qty 2

## 2023-09-05 MED ORDER — DROPERIDOL 2.5 MG/ML IJ SOLN: 0.6250 mg | Freq: Once | INTRAMUSCULAR | Status: DC | PRN

## 2023-09-05 MED ORDER — ACETAMINOPHEN 500 MG PO TABS
ORAL_TABLET | ORAL | Status: AC
  Filled 2023-09-05: qty 2

## 2023-09-05 MED ORDER — ACETAMINOPHEN 500 MG PO TABS
1000.0000 mg | ORAL_TABLET | Freq: Four times a day (QID) | ORAL | Status: DC
Start: 2023-09-06 — End: 2023-09-07
  Administered 2023-09-06 – 2023-09-07 (×6): 1000 mg via ORAL
  Filled 2023-09-05 (×6): qty 2

## 2023-09-05 MED ORDER — BUPIVACAINE LIPOSOME 1.3 % IJ SUSP
INTRAMUSCULAR | Status: AC
  Filled 2023-09-05: qty 20

## 2023-09-05 MED ORDER — KETAMINE HCL 50 MG/5ML IJ SOSY
PREFILLED_SYRINGE | INTRAMUSCULAR | Status: AC
Start: 2023-09-05 — End: ?
  Filled 2023-09-05: qty 5

## 2023-09-05 MED ORDER — PANTOPRAZOLE SODIUM 40 MG IV SOLR
40.0000 mg | Freq: Two times a day (BID) | INTRAVENOUS | Status: DC
  Administered 2023-09-05 – 2023-09-07 (×4): 40 mg via INTRAVENOUS
  Filled 2023-09-05 (×4): qty 10

## 2023-09-05 MED ORDER — MORPHINE SULFATE (PF) 2 MG/ML IV SOLN: 2.0000 mg | INTRAVENOUS | Status: DC | PRN

## 2023-09-05 MED ORDER — ONDANSETRON 4 MG PO TBDP
4.0000 mg | ORAL_TABLET | Freq: Four times a day (QID) | ORAL | Status: DC | PRN
  Administered 2023-09-05: 4 mg via ORAL
  Filled 2023-09-05: qty 1

## 2023-09-05 MED ORDER — PROCHLORPERAZINE MALEATE 10 MG PO TABS: 10.0000 mg | ORAL_TABLET | Freq: Four times a day (QID) | ORAL | Status: DC | PRN

## 2023-09-05 MED ORDER — DEXAMETHASONE SODIUM PHOSPHATE 10 MG/ML IJ SOLN
INTRAMUSCULAR | Status: DC | PRN
  Administered 2023-09-05: 10 mg via INTRAVENOUS

## 2023-09-05 MED ORDER — SODIUM CHLORIDE (PF) 0.9 % IJ SOLN
INTRAMUSCULAR | Status: DC | PRN
  Administered 2023-09-05: 100 mL via INTRAMUSCULAR

## 2023-09-05 MED ORDER — METHOCARBAMOL 1000 MG/10ML IJ SOLN: 500.0000 mg | Freq: Three times a day (TID) | INTRAMUSCULAR | Status: DC | PRN

## 2023-09-05 MED ORDER — ALVIMOPAN 12 MG PO CAPS
ORAL_CAPSULE | ORAL | Status: AC
  Filled 2023-09-05: qty 1

## 2023-09-05 MED ORDER — CHLORHEXIDINE GLUCONATE 0.12 % MT SOLN
OROMUCOSAL | Status: AC
  Filled 2023-09-05: qty 15

## 2023-09-05 MED ORDER — ONDANSETRON HCL 4 MG/2ML IJ SOLN
INTRAMUSCULAR | Status: AC
  Filled 2023-09-05: qty 2

## 2023-09-05 MED ORDER — ROCURONIUM BROMIDE 100 MG/10ML IV SOLN
INTRAVENOUS | Status: DC | PRN
  Administered 2023-09-05: 50 mg via INTRAVENOUS
  Administered 2023-09-05 (×2): 20 mg via INTRAVENOUS

## 2023-09-05 MED ORDER — OXYCODONE HCL 5 MG/5ML PO SOLN: 5.0000 mg | Freq: Once | ORAL | Status: DC | PRN

## 2023-09-05 MED ORDER — DEXMEDETOMIDINE HCL IN NACL 80 MCG/20ML IV SOLN
INTRAVENOUS | Status: DC | PRN
  Administered 2023-09-05: 12 ug via INTRAVENOUS
  Administered 2023-09-05: 16 ug via INTRAVENOUS

## 2023-09-05 MED ORDER — ACETAMINOPHEN 10 MG/ML IV SOLN: 1000.0000 mg | Freq: Once | INTRAVENOUS | Status: DC | PRN

## 2023-09-05 MED ORDER — 0.9 % SODIUM CHLORIDE (POUR BTL) OPTIME
TOPICAL | Status: DC | PRN
  Administered 2023-09-05: 500 mL

## 2023-09-05 MED ORDER — SUGAMMADEX SODIUM 200 MG/2ML IV SOLN
INTRAVENOUS | Status: AC
  Filled 2023-09-05: qty 2

## 2023-09-05 MED ORDER — SODIUM CHLORIDE (PF) 0.9 % IJ SOLN
INTRAMUSCULAR | Status: AC
  Filled 2023-09-05: qty 50

## 2023-09-05 MED ORDER — SUGAMMADEX SODIUM 200 MG/2ML IV SOLN
INTRAVENOUS | Status: DC | PRN
  Administered 2023-09-05: 200 mg via INTRAVENOUS

## 2023-09-05 MED ORDER — DIPHENHYDRAMINE HCL 12.5 MG/5ML PO ELIX: 12.5000 mg | ORAL_SOLUTION | Freq: Four times a day (QID) | ORAL | Status: DC | PRN

## 2023-09-05 MED ORDER — ARTIFICIAL TEARS OPHTHALMIC OINT
TOPICAL_OINTMENT | OPHTHALMIC | Status: AC
  Filled 2023-09-05: qty 3.5

## 2023-09-05 MED ORDER — MELATONIN 5 MG PO TABS: 2.5000 mg | ORAL_TABLET | Freq: Every evening | ORAL | Status: DC | PRN

## 2023-09-05 MED ORDER — CITALOPRAM HYDROBROMIDE 20 MG PO TABS
40.0000 mg | ORAL_TABLET | Freq: Every day | ORAL | Status: DC
  Administered 2023-09-06 – 2023-09-07 (×2): 40 mg via ORAL
  Filled 2023-09-05 (×2): qty 2

## 2023-09-05 MED ORDER — KETOROLAC TROMETHAMINE 15 MG/ML IJ SOLN
15.0000 mg | Freq: Four times a day (QID) | INTRAMUSCULAR | Status: DC
  Administered 2023-09-05 – 2023-09-06 (×5): 15 mg via INTRAVENOUS
  Filled 2023-09-05 (×4): qty 1

## 2023-09-05 MED ORDER — GABAPENTIN 300 MG PO CAPS
ORAL_CAPSULE | ORAL | Status: AC
  Filled 2023-09-05: qty 1

## 2023-09-05 MED ORDER — DEXAMETHASONE SODIUM PHOSPHATE 10 MG/ML IJ SOLN
INTRAMUSCULAR | Status: AC
  Filled 2023-09-05: qty 1

## 2023-09-05 MED ORDER — LIDOCAINE HCL (PF) 2 % IJ SOLN
INTRAMUSCULAR | Status: AC
  Filled 2023-09-05: qty 5

## 2023-09-05 MED ORDER — PROPOFOL 10 MG/ML IV BOLUS
INTRAVENOUS | Status: DC | PRN
  Administered 2023-09-05: 200 mg via INTRAVENOUS
  Administered 2023-09-05: 100 mg via INTRAVENOUS

## 2023-09-05 MED ORDER — LIDOCAINE HCL (CARDIAC) PF 100 MG/5ML IV SOSY
PREFILLED_SYRINGE | INTRAVENOUS | Status: DC | PRN
  Administered 2023-09-05: 80 mg via INTRAVENOUS

## 2023-09-05 MED ORDER — PROCHLORPERAZINE EDISYLATE 10 MG/2ML IJ SOLN: 5.0000 mg | Freq: Four times a day (QID) | INTRAMUSCULAR | Status: DC | PRN

## 2023-09-05 MED ORDER — HYDRALAZINE HCL 20 MG/ML IJ SOLN: 10.0000 mg | INTRAMUSCULAR | Status: DC | PRN

## 2023-09-05 MED ORDER — EPHEDRINE SULFATE-NACL 50-0.9 MG/10ML-% IV SOSY
PREFILLED_SYRINGE | INTRAVENOUS | Status: DC | PRN
  Administered 2023-09-05 (×2): 10 mg via INTRAVENOUS
  Administered 2023-09-05: 5 mg via INTRAVENOUS

## 2023-09-05 MED ORDER — SODIUM CHLORIDE 0.9 % IV SOLN
INTRAVENOUS | Status: AC
  Filled 2023-09-05: qty 2

## 2023-09-05 MED ORDER — ROCURONIUM BROMIDE 10 MG/ML (PF) SYRINGE
PREFILLED_SYRINGE | INTRAVENOUS | Status: AC
  Filled 2023-09-05: qty 10

## 2023-09-05 MED ORDER — DIPHENHYDRAMINE HCL 50 MG/ML IJ SOLN: 12.5000 mg | Freq: Four times a day (QID) | INTRAMUSCULAR | Status: DC | PRN

## 2023-09-05 MED ORDER — OXYCODONE HCL 5 MG PO TABS
5.0000 mg | ORAL_TABLET | ORAL | Status: DC | PRN
Start: 2023-09-05 — End: 2023-09-07
  Administered 2023-09-05: 10 mg via ORAL
  Administered 2023-09-06 (×3): 5 mg via ORAL
  Filled 2023-09-05 (×2): qty 2
  Filled 2023-09-05 (×3): qty 1

## 2023-09-05 MED ORDER — ONDANSETRON HCL 4 MG/2ML IJ SOLN
INTRAMUSCULAR | Status: DC | PRN
  Administered 2023-09-05: 4 mg via INTRAVENOUS

## 2023-09-05 MED ORDER — METHOCARBAMOL 500 MG PO TABS
500.0000 mg | ORAL_TABLET | Freq: Three times a day (TID) | ORAL | Status: DC | PRN
  Administered 2023-09-05 – 2023-09-06 (×2): 500 mg via ORAL
  Filled 2023-09-05 (×2): qty 1

## 2023-09-05 MED ORDER — PHENYLEPHRINE 80 MCG/ML (10ML) SYRINGE FOR IV PUSH (FOR BLOOD PRESSURE SUPPORT)
PREFILLED_SYRINGE | INTRAVENOUS | Status: DC | PRN
  Administered 2023-09-05: 80 ug via INTRAVENOUS

## 2023-09-05 MED ORDER — SODIUM CHLORIDE 0.9 % IV SOLN
2.0000 g | Freq: Two times a day (BID) | INTRAVENOUS | Status: AC
  Administered 2023-09-05 – 2023-09-07 (×4): 2 g via INTRAVENOUS
  Filled 2023-09-05 (×5): qty 2

## 2023-09-05 SURGICAL SUPPLY — 50 items
BARRIER ADH SEPRAFILM 3INX5IN (MISCELLANEOUS) IMPLANT
BLADE SURG SZ10 CARB STEEL (BLADE) ×1 IMPLANT
DERMABOND ADVANCED .7 DNX12 (GAUZE/BANDAGES/DRESSINGS) ×2 IMPLANT
DRAPE INCISE IOBAN 66X45 STRL (DRAPES) ×1 IMPLANT
DRSG OPSITE POSTOP 3X4 (GAUZE/BANDAGES/DRESSINGS) IMPLANT
DRSG OPSITE POSTOP 4X8 (GAUZE/BANDAGES/DRESSINGS) IMPLANT
ELECT REM PT RETURN 9FT ADLT (ELECTROSURGICAL) ×1 IMPLANT
ELECTRODE REM PT RTRN 9FT ADLT (ELECTROSURGICAL) ×1 IMPLANT
GLOVE BIO SURGEON STRL SZ7 (GLOVE) ×3 IMPLANT
GOWN STRL REUS W/ TWL LRG LVL3 (GOWN DISPOSABLE) ×4 IMPLANT
HANDLE SUCTION POOLE (INSTRUMENTS) ×1 IMPLANT
HANDLE YANKAUER SUCT BULB TIP (MISCELLANEOUS) ×1 IMPLANT
HOLDER FOLEY CATH W/STRAP (MISCELLANEOUS) ×1 IMPLANT
MANIFOLD NEPTUNE II (INSTRUMENTS) ×1 IMPLANT
NDL HYPO 22X1.5 SAFETY MO (MISCELLANEOUS) ×1 IMPLANT
NEEDLE HYPO 22X1.5 SAFETY MO (MISCELLANEOUS) ×1 IMPLANT
NS IRRIG 1000ML POUR BTL (IV SOLUTION) ×1 IMPLANT
PACK COLON CLEAN CLOSURE (MISCELLANEOUS) ×1 IMPLANT
PACK LAP CHOLECYSTECTOMY (MISCELLANEOUS) ×1 IMPLANT
RELOAD PROXIMATE 75MM BLUE (ENDOMECHANICALS) ×6 IMPLANT
RELOAD STAPLE 60 2.6 WHT THN (STAPLE) IMPLANT
RELOAD STAPLE 75 3.8 BLU REG (ENDOMECHANICALS) IMPLANT
RETRACTOR WOUND ALXS 18CM MED (MISCELLANEOUS) IMPLANT
RTRCTR WOUND ALEXIS O 18CM MED (MISCELLANEOUS) IMPLANT
SHEARS HARMONIC 36 ACE (MISCELLANEOUS) ×1 IMPLANT
SLEEVE Z-THREAD 5X100MM (TROCAR) ×1 IMPLANT
SPIKE FLUID TRANSFER (MISCELLANEOUS) ×1 IMPLANT
SPONGE T-LAP 18X18 ~~LOC~~+RFID (SPONGE) ×3 IMPLANT
SPONGE T-LAP 18X36 ~~LOC~~+RFID STR (SPONGE) ×1 IMPLANT
STAPLE ECHEON FLEX 60 POW ENDO (STAPLE) IMPLANT
STAPLER PROXIMATE 75MM BLUE (STAPLE) IMPLANT
STAPLER RELOAD WHITE 60MM (STAPLE) ×1 IMPLANT
SUCTION POOLE HANDLE (INSTRUMENTS) ×1 IMPLANT
SUT MNCRL 4-0 27 PS-2 XMFL (SUTURE) ×1 IMPLANT
SUT PDS AB 0 CT1 27 (SUTURE) ×2 IMPLANT
SUT SILK 2 0 SH CR/8 (SUTURE) ×1 IMPLANT
SUT SILK 2-0 30XBRD TIE 12 (SUTURE) ×1 IMPLANT
SUT SILK 3-0 18XBRD TIE 12 (SUTURE) IMPLANT
SUT VICRYL 0 UR6 27IN ABS (SUTURE) ×2 IMPLANT
SUTURE MNCRL 4-0 27XMF (SUTURE) ×2 IMPLANT
SYR 20ML LL LF (SYRINGE) ×1 IMPLANT
SYR BULB IRRIG 60ML STRL (SYRINGE) IMPLANT
SYS LAPSCP GELPORT 120MM (MISCELLANEOUS) ×1 IMPLANT
SYSTEM LAPSCP GELPORT 120MM (MISCELLANEOUS) ×1 IMPLANT
TOWEL OR 17X26 4PK STRL BLUE (TOWEL DISPOSABLE) ×1 IMPLANT
TRAP FLUID SMOKE EVACUATOR (MISCELLANEOUS) ×1 IMPLANT
TRAY FOLEY MTR SLVR 16FR STAT (SET/KITS/TRAYS/PACK) ×1 IMPLANT
TROCAR Z-THREAD FIOS 5X100MM (TROCAR) ×1 IMPLANT
TUBING EVAC SMOKE HEATED PNEUM (TUBING) ×1 IMPLANT
WATER STERILE IRR 500ML POUR (IV SOLUTION) ×1 IMPLANT

## 2023-09-05 NOTE — Anesthesia Preprocedure Evaluation (Addendum)
 Anesthesia Evaluation  Patient identified by MRN, date of birth, ID band Patient awake    Reviewed: Allergy & Precautions, H&P , NPO status , Patient's Chart, lab work & pertinent test results  Airway Mallampati: II  TM Distance: >3 FB Neck ROM: full    Dental no notable dental hx.    Pulmonary former smoker   Pulmonary exam normal        Cardiovascular Exercise Tolerance: Good hypertension, Normal cardiovascular exam     Neuro/Psych  Headaches PSYCHIATRIC DISORDERS Anxiety        GI/Hepatic Neg liver ROS, hiatal hernia (small),GERD  Medicated and Controlled,,  Endo/Other  negative endocrine ROS    Renal/GU      Musculoskeletal   Abdominal Normal abdominal exam  (+)   Peds  Hematology negative hematology ROS (+)   Anesthesia Other Findings colonic mass  Past Medical History: No date: Allergy No date: Anxiety No date: GERD (gastroesophageal reflux disease) No date: History of kidney stones No date: Hypertension  Past Surgical History: 08/08/2023: COLONOSCOPY WITH PROPOFOL; N/A     Comment:  Procedure: COLONOSCOPY WITH PROPOFOL;  Surgeon: [name], MD;  Location: [facility] ENDOSCOPY;  Service:               Endoscopy;  Laterality: N/A; 11/04/2021: ESOPHAGOGASTRODUODENOSCOPY (EGD) WITH PROPOFOL; N/A     Comment:  Procedure: ESOPHAGOGASTRODUODENOSCOPY (EGD) WITH               PROPOFOL;  Surgeon: [name], MD;  Location: [facility]               ENDOSCOPY;  Service: Endoscopy;  Laterality: N/A; 07/2014: ROOT CANAL     Reproductive/Obstetrics negative OB ROS                             Anesthesia Physical Anesthesia Plan  ASA: 2  Anesthesia Plan: General ETT   Post-op Pain Management: Tylenol PO (pre-op)*, Celebrex PO (pre-op)* and Gabapentin PO (pre-op)*   Induction: Intravenous  PONV Risk Score and Plan: 2 and Ondansetron, Dexamethasone and Midazolam  Airway  Management Planned: Oral ETT  Additional Equipment:   Intra-op Plan:   Post-operative Plan: Extubation in OR  Informed Consent: I have reviewed the patients History and Physical, chart, labs and discussed the procedure including the risks, benefits and alternatives for the proposed anesthesia with the patient or authorized representative who has indicated his/her understanding and acceptance.     Dental Advisory Given  Plan Discussed with: CRNA and Surgeon  Anesthesia Plan Comments:         Anesthesia Quick Evaluation

## 2023-09-05 NOTE — Transfer of Care (Signed)
 Immediate Anesthesia Transfer of Care Note  Patient: Rodney Myers  Procedure(s) Performed: COLECTOMY, RIGHT, LAPAROSCOPIC (Abdomen)  Patient Location: PACU  Anesthesia Type:General  Level of Consciousness: drowsy  Airway & Oxygen Therapy: Patient Spontanous Breathing and Patient connected to face mask oxygen  Post-op Assessment: Report given to RN  Post vital signs: stable  Last Vitals:  Vitals Value Taken Time  BP 122/69 09/05/23 1819  Temp    Pulse 83 09/05/23 1821  Resp 26 09/05/23 1821  SpO2 97 % 09/05/23 1821  Vitals shown include unfiled device data.  Last Pain:  Vitals:   09/05/23 1022  TempSrc: Temporal  PainSc: 7          Complications: No notable events documented.

## 2023-09-05 NOTE — Anesthesia Procedure Notes (Signed)
 Procedure Name: Intubation Date/Time: 09/05/2023 3:47 PM  Performed by: Owens Loffler, RNPre-anesthesia Checklist: Patient identified, Patient being monitored, Timeout performed, Emergency Drugs available and Suction available Patient Re-evaluated:Patient Re-evaluated prior to induction Oxygen Delivery Method: Circle system utilized Preoxygenation: Pre-oxygenation with 100% oxygen Induction Type: IV induction Ventilation: Mask ventilation without difficulty Laryngoscope Size: Mac and 3 Grade View: Grade I Tube type: Oral Tube size: 7.5 mm Number of attempts: 1 Airway Equipment and Method: Stylet and Video-laryngoscopy Placement Confirmation: ETT inserted through vocal cords under direct vision, positive ETCO2 and breath sounds checked- equal and bilateral Secured at: 22 cm Tube secured with: Tape Dental Injury: Teeth and Oropharynx as per pre-operative assessment

## 2023-09-05 NOTE — Op Note (Signed)
 PROCEDURES: 1. Hand assisted Laparoscopic Right Hemicolectomy With stapled ileocolostomy  Pre-operative Diagnosis: Right Colon CA  Post-operative Diagnosis: Same  Surgeon: Merri Ray Donyale Berthold   Anesthesia: General endotracheal anesthesia  ASA Class: 2  Surgeon: Sterling Big , MD FACS  Anesthesia: Gen. with endotracheal tube   Findings: Right colon mass, no evidence of distant metastasis Tension free anastomosis, no evidence of intraop leak and good perfusion  Estimated Blood Loss: 20cc         Drains: none         Specimens: Right colon          Complications: none          Procedure Details  The patient was seen again in the Holding Room. The benefits, complications, treatment options, and expected outcomes were discussed with the patient. The risks of bleeding, infection, recurrence of symptoms, failure to resolve symptoms,  bowel injury, any of which could require further surgery were reviewed with the patient.   The patient was taken to Operating Room, identified as Jerrye Bushy and the procedure verified.  A Time Out was held and the above information confirmed.  Prior to the induction of general anesthesia, antibiotic prophylaxis was administered. VTE prophylaxis was in place. General endotracheal anesthesia was then administered and tolerated well. After the induction, the abdomen was prepped with Chloraprep and draped in the sterile fashion. The patient was positioned in the supine position.  7 cm incision was created as a midline mini laparotomy. The abdominal cavity was entered under direct visualization and the GelPort device was placed. two 5 mm ports were placed  under direct visualization and pneumoperitoneum was obtained.  The more dynamic changes were observed. The greater omentum was divided and the hepatic flexure was taken down using harmonic scalpel.  The white line of Toldt was incised and a lateral to medial dissection was performed.  We identified the right  ureter as well as the duodenum and preserve both structures at all times. Were also able to mobilize the attachments of the cecum and terminal ileum.  Once we had an adequate mobilization were able to remove the GelPort and exteriorized the right colon. The right ileocolic pedicle was identified and ligated using 60 mm vascular stapler.  A 5 cm margin on the terminal ileum was identified and we created a window with electrocautery and divided the terminal ileum.  Attention then was turned to the distal excision margin.  We identified the middle colic artery on selected a spot right to the middle colic artery.  Were able to also use a 75 GIA stapler to divide this area.  The mesentery was scored with electrocautery.  .  The rest of the mesentery was divided using the harmonic scalpel.  Please note that we went as low as possible to the base of the mesentery to obtain adequate lymph nodes and adequate margins of dissection. Specimen was passed and sent to permanent pathology.  A standard side-to-side functional end to end staple anastomosis was created with a 75 GIA stapler device and common channel was closed with another 75 GIA stapler..  We check for patency as well as leak.  There was a tension-free anastomosis with good perfusion and no evidence of intraoperative leak.   We changed gloves and place a clean closure tray.   Liposomal Marcaine was injected throughout the abdominal wall on both sides under direct visualization and palpation.  The fascia was closed with a running 0 PDS using the small bite  techniques.  Incisions were closed with staples.  .  Needle and laparotomy counts were correct and there were no immediate complications   Sterling Big, MD, FACS

## 2023-09-05 NOTE — Interval H&P Note (Signed)
 History and Physical Interval Note:  09/05/2023 2:13 PM  Rodney Myers  has presented today for surgery, with the diagnosis of colonic mass.  The various methods of treatment have been discussed with the patient and family. After consideration of risks, benefits and other options for treatment, the patient has consented to  Procedure(s): COLECTOMY, RIGHT, LAPAROSCOPIC (N/A) as a surgical intervention.  The patient's history has been reviewed, patient examined, no change in status, stable for surgery.  I have reviewed the patient's chart and labs.  Questions were answered to the patient's satisfaction.     Saphyra Hutt F Namita Yearwood

## 2023-09-06 LAB — BASIC METABOLIC PANEL
Anion gap: 7 (ref 5–15)
BUN: 15 mg/dL (ref 6–20)
CO2: 22 mmol/L (ref 22–32)
Calcium: 8.5 mg/dL — ABNORMAL LOW (ref 8.9–10.3)
Chloride: 108 mmol/L (ref 98–111)
Creatinine, Ser: 1.15 mg/dL (ref 0.61–1.24)
GFR, Estimated: >60 mL/min (ref >60)
Glucose, Bld: 119 mg/dL — ABNORMAL HIGH (ref 70–99)
Potassium: 3.9 mmol/L (ref 3.5–5.1)
Sodium: 137 mmol/L (ref 135–145)

## 2023-09-06 LAB — CBC
HCT: 38.5 % — ABNORMAL LOW (ref 39.0–52.0)
Hemoglobin: 13.2 g/dL (ref 13.0–17.0)
MCH: 27.9 pg (ref 26.0–34.0)
MCHC: 34.3 g/dL (ref 30.0–36.0)
MCV: 81.4 fL (ref 80.0–100.0)
Platelets: 218 10*3/uL (ref 150–400)
RBC: 4.73 MIL/uL (ref 4.22–5.81)
RDW: 13.6 % (ref 11.5–15.5)
WBC: 12.8 10*3/uL — ABNORMAL HIGH (ref 4.0–10.5)
nRBC: 0 % (ref 0.0–0.2)

## 2023-09-06 LAB — MAGNESIUM: Magnesium: 2 mg/dL (ref 1.7–2.4)

## 2023-09-06 MED ORDER — KETOROLAC TROMETHAMINE 15 MG/ML IJ SOLN: 15.0000 mg | Freq: Four times a day (QID) | INTRAMUSCULAR | Status: DC | PRN

## 2023-09-06 MED ORDER — PREGABALIN 50 MG PO CAPS
100.0000 mg | ORAL_CAPSULE | Freq: Three times a day (TID) | ORAL | Status: DC
  Administered 2023-09-07: 100 mg via ORAL
  Filled 2023-09-06: qty 2

## 2023-09-06 MED ORDER — PREGABALIN 50 MG PO CAPS
100.0000 mg | ORAL_CAPSULE | Freq: Three times a day (TID) | ORAL | Status: DC
  Administered 2023-09-06: 100 mg via ORAL
  Filled 2023-09-06: qty 2

## 2023-09-06 NOTE — Plan of Care (Signed)

## 2023-09-06 NOTE — TOC CM/SW Note (Signed)
 Transition of Care [facility]) - Inpatient Brief Assessment   Patient Details  Name: Rodney Myers MRN: [id number] Date of Birth: 17-Feb-1981  Transition of Care [facility]) CM/SW Contact:    Chapman Fitch, RN Phone Number: 09/06/2023, 10:30 AM   Clinical Narrative:   Transition of Care [facility]) Screening Note   Patient Details  Name: Rodney Myers Date of Birth: 1980-11-07   Transition of Care [facility]) CM/SW Contact:    Chapman Fitch, RN Phone Number: 09/06/2023, 10:30 AM    Transition of Care Department [facility]) has reviewed patient and no TOC needs have been identified at this time.  If new patient transition needs arise, please place a TOC consult.    Transition of Care Asessment: Insurance and Status: Insurance coverage has been reviewed Patient has primary care physician: Yes     Prior/Current Home Services: No current home services Social Drivers of Health Review: SDOH reviewed no interventions necessary Readmission risk has been reviewed: Yes Transition of care needs: no transition of care needs at this time

## 2023-09-06 NOTE — Plan of Care (Addendum)
 Received from surgery with RN. Wife present. Vital signs documented. Bed adjusted. Abdominal dressing observed X4, small old ooze marked to middle  honeycomb dressing, reports pain 8/10 to abdomen. Will administer analgesia when due. Taking ice chips, denies nausea, foley in  place draining amber urine.  Bilateral upper extremity  PIV noted. Will continue to monitor. 0600 Encouraged to drink more fluids. Verbalized understanding. Slept on and off all night. Wife present.

## 2023-09-06 NOTE — Progress Notes (Signed)
 [facility] SURGICAL PROGRESS NOTE  Hospital Day(s): 1.   Post op day(s): 1 Day Post-Op.   Interval History:  Patient seen and examined No acute events or new complaints overnight.  Patient reports he is sore expectedly No fever, chills, nausea, emesis Mild leukocytosis to 12.8K; likely reactive Hgb to 13.2; stable Renal function normal; sCr - 1.15; UO - 625 ccs No electrolyte derangements CLD; tolerating No significant flatus yet Has not ambulated    Vital signs in last 24 hours: [min-max] current  Temp:  [97.4 F (36.3 C)-98.2 F (36.8 C)] 98 F (36.7 C) (03/26 0346) Pulse Rate:  [73-97] 79 (03/26 0346) Resp:  [16-20] 16 (03/26 0346) BP: (108-121)/(58-96) 108/72 (03/26 0346) SpO2:  [98 %-100 %] 98 % (03/26 0346) Weight:  [81.6 kg] 81.6 kg (03/25 1022)     Height: 5\' 5"  (165.1 cm) Weight: 81.6 kg BMI (Calculated): 29.95   Intake/Output last 2 shifts:  03/25 0701 - 03/26 0700 In: 2560 [P.O.:260; I.V.:2200; IV Piggyback:100] Out: 645 [Urine:625; Blood:20]   Physical Exam:  Constitutional: alert, cooperative and no distress  Respiratory: breathing non-labored at rest  Cardiovascular: regular rate and sinus rhythm  Gastrointestinal: soft, incisional soreness, and non-distended, no rebound/guarding Genitourinary: Foley in place; good UO  Integumentary: Laparoscopic incisions are CDI with staples; honeycomb in place, no erythema   Labs:     Latest Ref Rng & Units 09/06/2023    4:32 AM 08/08/2023    6:08 AM 08/07/2023    5:18 AM  CBC  WBC 4.0 - 10.5 K/uL 12.8  8.1  12.6   Hemoglobin 13.0 - 17.0 g/dL 16.1  09.6  04.5   Hematocrit 39.0 - 52.0 % 38.5  38.9  46.4   Platelets 150 - 400 K/uL 218  173  190       Latest Ref Rng & Units 09/06/2023    4:32 AM 08/08/2023    6:08 AM 08/07/2023    5:18 AM  CMP  Glucose 70 - 99 mg/dL 409  811  914   BUN 6 - 20 mg/dL 15  10  15    Creatinine 0.61 - 1.24 mg/dL 7.82  9.56  2.13   Sodium 135 - 145 mmol/L 137  139   139   Potassium 3.5 - 5.1 mmol/L 3.9  3.6  3.8   Chloride 98 - 111 mmol/L 108  113  103   CO2 22 - 32 mmol/L 22  20  22    Calcium 8.9 - 10.3 mg/dL 8.5  8.2  9.7     Imaging studies: No new pertinent imaging studies   Assessment/Plan:  43 y.o. male 1 Day Post-Op s/p hand assisted laparoscopic right hemicolectomy for right colon CA.   - Will leave on CLD for now; If bowel function returns we can do FLD and ADAT  - Discontinue foley catheter today  - Complete peri-operative Abx  - Monitor abdominal examination; on-going bowel function  - Pain control prn; antiemetics prn  - Monitor leukocytosis; likely reactive    - Needs to ambulate    All of the above findings and recommendations were discussed with the patient, patient's family (wife at bedside), and the medical team, and all of patient's and family's questions were answered to their expressed satisfaction.  -- Lynden Oxford, PA-C [facility] 09/06/2023, 7:48 AM M-F: 7am - 4pm

## 2023-09-07 LAB — CBC
HCT: 34.7 % — ABNORMAL LOW (ref 39.0–52.0)
Hemoglobin: 11.8 g/dL — ABNORMAL LOW (ref 13.0–17.0)
MCH: 28 pg (ref 26.0–34.0)
MCHC: 34 g/dL (ref 30.0–36.0)
MCV: 82.4 fL (ref 80.0–100.0)
Platelets: 180 10*3/uL (ref 150–400)
RBC: 4.21 MIL/uL — ABNORMAL LOW (ref 4.22–5.81)
RDW: 14 % (ref 11.5–15.5)
WBC: 9.8 10*3/uL (ref 4.0–10.5)
nRBC: 0 % (ref 0.0–0.2)

## 2023-09-07 LAB — BASIC METABOLIC PANEL WITH GFR
Anion gap: 8 (ref 5–15)
BUN: 12 mg/dL (ref 6–20)
CO2: 23 mmol/L (ref 22–32)
Calcium: 8 mg/dL — ABNORMAL LOW (ref 8.9–10.3)
Chloride: 109 mmol/L (ref 98–111)
Creatinine, Ser: 1.13 mg/dL (ref 0.61–1.24)
GFR, Estimated: >60 mL/min (ref >60)
Glucose, Bld: 101 mg/dL — ABNORMAL HIGH (ref 70–99)
Potassium: 3.2 mmol/L — ABNORMAL LOW (ref 3.5–5.1)
Sodium: 140 mmol/L (ref 135–145)

## 2023-09-07 MED ORDER — OXYCODONE HCL 5 MG PO TABS: 5.0000 mg | ORAL_TABLET | ORAL | 0 refills | Status: DC | PRN

## 2023-09-07 MED ORDER — POTASSIUM CHLORIDE CRYS ER 20 MEQ PO TBCR
20.0000 meq | EXTENDED_RELEASE_TABLET | Freq: Two times a day (BID) | ORAL | Status: DC
  Administered 2023-09-07: 20 meq via ORAL
  Filled 2023-09-07: qty 1

## 2023-09-07 NOTE — Discharge Instructions (Signed)
 In addition to included general post-operative instructions,  Diet: Resume home diet.   Activity: No heavy lifting >20 pounds (children, pets, laundry, garbage) or strenuous activity for 6 weeks, but light activity and walking are encouraged. Do not drive or drink alcohol if taking narcotic pain medications or having pain that might distract from driving.  Wound care: You may shower/get incision wet with soapy water and pat dry (do not rub incisions), but no baths or submerging incision underwater until follow-up. We will remove staples on 04/07  Medications: Resume all home medications. For mild to moderate pain: acetaminophen (Tylenol) or ibuprofen/naproxen (if no kidney disease). Combining Tylenol with alcohol can substantially increase your risk of causing liver disease. Narcotic pain medications, if prescribed, can be used for severe pain, though may cause nausea, constipation, and drowsiness. Do not combine Tylenol and Percocet (or similar) within a 6 hour period as Percocet (and similar) contain(s) Tylenol. If you do not need the narcotic pain medication, you do not need to fill the prescription.  Call office [phone] / [phone]) at any time if any questions, worsening pain, fevers/chills, bleeding, drainage from incision site, or other concerns.

## 2023-09-07 NOTE — Discharge Summary (Signed)
 [facility] SURGICAL DISCHARGE SUMMARY  Patient ID: Rodney Myers MRN: [id number] DOB/AGE: [birth year] 43 y.o.  Admit date: 09/05/2023 Discharge date: 09/07/2023  Discharge Diagnoses Patient Active Problem List   Diagnosis Date Noted   S/P right colectomy 09/05/2023    Consultants None  Procedures 09/05/2023:  Hand assisted laparoscopic right colectomy   HPI: Rodney Myers is a 43 y.o. male with history of right colon CA who presents to [facility] on 03/25 for surgery.   Hospital Course: Informed consent was obtained and documented, and patient underwent uneventful hand assisted laparoscopic right hemicolectomy (Dr Everlene Farrier, 09/05/2023).  Post-operatively, patient did well with ROBF on POD1. Advancement of patient's diet and ambulation were well-tolerated. The remainder of patient's hospital course was essentially unremarkable, and discharge planning was initiated accordingly with patient safely able to be discharged home with appropriate discharge instructions, pain control, and outpatient follow-up after all of his questions were answered to his expressed satisfaction.   Discharge Condition: Good   Physical Examination:  Constitutional: alert, cooperative and no distress  Respiratory: breathing non-labored at rest  Cardiovascular: regular rate and sinus rhythm  Gastrointestinal: soft, incisional soreness, and non-distended, no rebound/guarding Integumentary: Laparoscopic incisions are CDI with staples, no erythema   Allergies as of 09/07/2023       Reactions   Wellbutrin [bupropion] Other (See Comments)   irritablity        Medication List     STOP taking these medications    metroNIDAZOLE 500 MG tablet Commonly known as: Flagyl   neomycin 500 MG tablet Commonly known as: MYCIFRADIN   polyethylene glycol powder 17 GM/SCOOP powder Commonly known as: MiraLax       TAKE these medications    atorvastatin 40 MG tablet Commonly known as:  LIPITOR Take 1 tablet (40 mg total) by mouth daily.   citalopram 40 MG tablet Commonly known as: CELEXA Take 1 tablet (40 mg total) by mouth daily.   naproxen sodium 220 MG tablet Commonly known as: ALEVE Take 220-440 mg by mouth daily as needed (pain.).   omeprazole 20 MG capsule Commonly known as: PRILOSEC Take 1-2 capsules (20-40 mg total) by mouth daily. What changed:  how much to take when to take this   oxyCODONE 5 MG immediate release tablet Commonly known as: Oxy IR/ROXICODONE Take 1 tablet (5 mg total) by mouth every 4 (four) hours as needed for severe pain (pain score 7-10) or breakthrough pain.   prazosin 1 MG capsule Commonly known as: MINIPRESS Take 1 capsule (1 mg total) by mouth at bedtime.          Follow-up Information     Leafy Ro, MD. Go on 09/18/2023.   Specialty: General Surgery Why: Go to appointment on 04/10 at 1045 AM Contact information: [address] [place] [address] [phone]                  Time spent on discharge management including discussion of hospital course, clinical condition, outpatient instructions, prescriptions, and follow up with the patient and members of the medical team: >30 minutes  -- Lynden Oxford , PA-C [facility]  09/07/2023, 10:44 AM [phone] M-F: 7am - 4pm

## 2023-09-07 NOTE — Plan of Care (Signed)

## 2023-09-07 NOTE — Plan of Care (Signed)

## 2023-09-08 ENCOUNTER — Telehealth: Payer: Self-pay | Admitting: Surgery

## 2023-09-08 ENCOUNTER — Encounter: Payer: Self-pay | Admitting: Surgery

## 2023-09-08 LAB — SURGICAL PATHOLOGY

## 2023-09-08 NOTE — Telephone Encounter (Signed)
 Pathology results d/w the pt , ( 8 cm Tubular adenoma w High grade dysplasia, negative nodes)  pt is doing very well and is very appreciative

## 2023-09-11 ENCOUNTER — Telehealth: Payer: Self-pay | Admitting: *Deleted

## 2023-09-11 NOTE — Telephone Encounter (Signed)
 Faxed FMLA to [employer] at [phone]

## 2023-09-12 NOTE — Anesthesia Postprocedure Evaluation (Signed)
 Anesthesia Post Note  Patient: Rodney Myers  Procedure(s) Performed: COLECTOMY, RIGHT, LAPAROSCOPIC (Abdomen)  Patient location during evaluation: PACU Anesthesia Type: General Level of consciousness: awake and alert Pain management: pain level controlled Vital Signs Assessment: post-procedure vital signs reviewed and stable Respiratory status: spontaneous breathing, nonlabored ventilation, respiratory function stable and patient connected to nasal cannula oxygen Cardiovascular status: blood pressure returned to baseline and stable Postop Assessment: no apparent nausea or vomiting Anesthetic complications: no   No notable events documented.   Last Vitals:  Vitals:   09/07/23 0142 09/07/23 0448  BP: 116/82 114/77  Pulse: 70 70  Resp: 16 20  Temp: 36.7 C 36.9 C  SpO2: 98% 98%    Last Pain:  Vitals:   09/07/23 0930  TempSrc:   PainSc: 0-No pain                 Yevette Edwards

## 2023-09-18 ENCOUNTER — Encounter: Payer: Self-pay | Admitting: Surgery

## 2023-09-18 ENCOUNTER — Ambulatory Visit (INDEPENDENT_AMBULATORY_CARE_PROVIDER_SITE_OTHER): Payer: PRIVATE HEALTH INSURANCE | Admitting: Surgery

## 2023-09-18 VITALS — BP 125/80 | HR 85 | Temp 98.1°F | Ht 65.0 in | Wt 182.0 lb

## 2023-09-18 DIAGNOSIS — C182 Malignant neoplasm of ascending colon: Secondary | ICD-10-CM

## 2023-09-18 DIAGNOSIS — Z09 Encounter for follow-up examination after completed treatment for conditions other than malignant neoplasm: Secondary | ICD-10-CM

## 2023-09-18 DIAGNOSIS — Z08 Encounter for follow-up examination after completed treatment for malignant neoplasm: Secondary | ICD-10-CM

## 2023-09-18 NOTE — Progress Notes (Signed)
 Rodney Myers is S/p Robo R colectomy , 8cm polyp w high grade dysplasia, margins negative an nodes w/o dz Doing very well Tolerating PO Having BM, ambulating No pain  PE NAD AVSS Abd: soft, minimal incisional tenderness w/o peritonitis Staples removed, wound healing well w/o infection  A/P Doing well w/o complications No need for further intervention HE is appreciative RTC 3 months

## 2023-09-18 NOTE — Patient Instructions (Signed)

## 2023-09-19 ENCOUNTER — Ambulatory Visit: Payer: Self-pay | Admitting: Family Medicine

## 2023-10-09 ENCOUNTER — Encounter: Payer: Self-pay | Admitting: Family Medicine

## 2023-10-09 ENCOUNTER — Ambulatory Visit (INDEPENDENT_AMBULATORY_CARE_PROVIDER_SITE_OTHER): Payer: PRIVATE HEALTH INSURANCE | Admitting: Family Medicine

## 2023-10-09 VITALS — BP 132/90 | HR 98 | Ht 65.0 in | Wt 188.0 lb

## 2023-10-09 DIAGNOSIS — E785 Hyperlipidemia, unspecified: Secondary | ICD-10-CM

## 2023-10-09 DIAGNOSIS — R739 Hyperglycemia, unspecified: Secondary | ICD-10-CM | POA: Diagnosis not present

## 2023-10-09 DIAGNOSIS — F515 Nightmare disorder: Secondary | ICD-10-CM

## 2023-10-09 LAB — BAYER DCA HB A1C WAIVED: HB A1C (BAYER DCA - WAIVED): 5.5 % (ref 4.8–5.6)

## 2023-10-09 MED ORDER — PRAZOSIN HCL 2 MG PO CAPS: 2.0000 mg | ORAL_CAPSULE | Freq: Every day | ORAL | 3 refills | Status: DC

## 2023-10-09 NOTE — Assessment & Plan Note (Signed)
 Under good control on current regimen. Continue current regimen. Continue to monitor. Call with any concerns. Refills given. Labs drawn today.

## 2023-10-09 NOTE — Progress Notes (Signed)
 BP (!) 132/90 (BP Location: Left Arm, Patient Position: Sitting, Cuff Size: Large)   Pulse 98   Ht 5\' 5"  (1.651 m)   Wt 188 lb (85.3 kg)   SpO2 98%   BMI 31.28 kg/m    Subjective:    Patient ID: Rodney Myers, male    DOB: [birth year], 43 y.o.   MRN: [id number]  HPI: Rodney Myers is a 43 y.o. male  Chief Complaint  Patient presents with   Depression   NIGHTMARES   DEPRESSION- started the prazosin  after the surgery, having less vivid dreams. Down like 75% on the dreams  Mood status: better Satisfied with current treatment?: yes Symptom severity: moderate  Duration of current treatment :  couple of weeks Side effects: no Medication compliance: excellent compliance Psychotherapy/counseling: yes  Previous psychiatric medications: celexa , prazosin  Depressed mood: no Anxious mood: no Anhedonia: no Significant weight loss or gain: no Insomnia: no  Fatigue: yes Feelings of worthlessness or guilt: no Impaired concentration/indecisiveness: no Suicidal ideations: no Hopelessness: no Crying spells: no    10/09/2023    2:37 PM 09/04/2023   10:16 AM 08/17/2023    3:48 PM 03/21/2023    8:08 AM 02/23/2023    3:16 PM  Depression screen PHQ 2/9  Decreased Interest 0 0 1 1 1   Down, Depressed, Hopeless 0 1 2 1 1   PHQ - 2 Score 0 1 3 2 2   Altered sleeping 2 3 2  0 1  Tired, decreased energy 1 0 2 1 1   Change in appetite 0 0 2 0 1  Feeling bad or failure about yourself  0 1 2 1 1   Trouble concentrating 0 0 1 0 0  Moving slowly or fidgety/restless 0 0 0 0 0  Suicidal thoughts 0 0 0 0 1  PHQ-9 Score 3 5 12 4 7   Difficult doing work/chores    Not difficult at all Not difficult at all   HYPERLIPIDEMIA Hyperlipidemia status: excellent compliance Satisfied with current treatment?  yes Side effects:  no Medication compliance: excellent compliance Past cholesterol meds: atorvastatin  Supplements: none Aspirin:  no The 10-year ASCVD risk score (Arnett DK, et al., 2019) is:  3.7%   Values used to calculate the score:     Age: 78 years     Sex: Male     Is Non-Hispanic African American: No     Diabetic: No     Tobacco smoker: No     Systolic Blood Pressure: 132 mmHg     Is BP treated: No     HDL Cholesterol: 31 mg/dL     Total Cholesterol: 228 mg/dL Chest pain:  no  Relevant past medical, surgical, family and social history reviewed and updated as indicated. Interim medical history since our last visit reviewed. Allergies and medications reviewed and updated.  Review of Systems  Constitutional: Negative.   Respiratory: Negative.    Cardiovascular: Negative.   Musculoskeletal: Negative.   Neurological: Negative.   Psychiatric/Behavioral:  Positive for sleep disturbance. Negative for agitation, behavioral problems, confusion, decreased concentration, dysphoric mood, hallucinations, self-injury and suicidal ideas. The patient is not nervous/anxious and is not hyperactive.     Per HPI unless specifically indicated above     Objective:    BP (!) 132/90 (BP Location: Left Arm, Patient Position: Sitting, Cuff Size: Large)   Pulse 98   Ht 5\' 5"  (1.651 m)   Wt 188 lb (85.3 kg)   SpO2 98%   BMI 31.28 kg/m   Wt  Readings from Last 3 Encounters:  10/09/23 188 lb (85.3 kg)  09/18/23 182 lb (82.6 kg)  09/05/23 180 lb (81.6 kg)    Physical Exam Vitals and nursing note reviewed.  Constitutional:      General: He is not in acute distress.    Appearance: Normal appearance. He is not ill-appearing, toxic-appearing or diaphoretic.  HENT:     Head: Normocephalic and atraumatic.     Right Ear: External ear normal.     Left Ear: External ear normal.     Nose: Nose normal.     Mouth/Throat:     Mouth: Mucous membranes are moist.     Pharynx: Oropharynx is clear.  Eyes:     General: No scleral icterus.       Right eye: No discharge.        Left eye: No discharge.     Extraocular Movements: Extraocular movements intact.     Conjunctiva/sclera:  Conjunctivae normal.     Pupils: Pupils are equal, round, and reactive to light.  Cardiovascular:     Rate and Rhythm: Normal rate and regular rhythm.     Pulses: Normal pulses.     Heart sounds: Normal heart sounds. No murmur heard.    No friction rub. No gallop.  Pulmonary:     Effort: Pulmonary effort is normal. No respiratory distress.     Breath sounds: Normal breath sounds. No stridor. No wheezing, rhonchi or rales.  Chest:     Chest wall: No tenderness.  Musculoskeletal:        General: Normal range of motion.     Cervical back: Normal range of motion and neck supple.  Skin:    General: Skin is warm and dry.     Capillary Refill: Capillary refill takes less than 2 seconds.     Coloration: Skin is not jaundiced or pale.     Findings: No bruising, erythema, lesion or rash.  Neurological:     General: No focal deficit present.     Mental Status: He is alert and oriented to person, place, and time. Mental status is at baseline.  Psychiatric:        Mood and Affect: Mood normal.        Behavior: Behavior normal.        Thought Content: Thought content normal.        Judgment: Judgment normal.     Results for orders placed or performed during the hospital encounter of 09/05/23  Surgical pathology   Collection Time: 09/05/23 12:00 AM  Result Value Ref Range   SURGICAL PATHOLOGY      SURGICAL PATHOLOGY [facility] [address], [address] [place], [place] [address] Telephone [phone] or [phone] Fax [phone]  REPORT OF SURGICAL PATHOLOGY   Accession #: [id number] Patient Name: Rodney Myers Visit # : [id number]  MRN: [id number] Physician: Evelia Hipp DOB/Age 13-Nov-1980 (Age: 47) Gender: M Collected Date: 09/05/2023 Received Date: 09/06/2023  FINAL DIAGNOSIS       1. Colon, segmental resection for tumor, right colectomy, colonic mass :       - TUBULAR ADENOMA MEASURING 8.7 CM WITH FOCAL HIGH-GRADE DYSPLASIA.      - CHANGES  CONSISTENT WITH PRIOR BIOPSY AND TATTOO INK PRESENT.      - UNREMARKABLE APPENDIX.      - THIRTY-ONE LYMPH NODES NEGATIVE FOR MALIGNANCY (0/31).      - UNREMARKABLE PROXIMAL AND DISTAL MARGINS.       DATE  SIGNED OUT: 09/08/2023 ELECTRONIC SIGNATURE : Brunetta Capes Md, Alexandria Ida , Pathologist, Electronic Signature  MICROSCOPIC DESCRIPTION  CASE COMMENTS STAINS USED IN DIAGNOSIS: H&E H& E H&E H&E H&E H&E H&E H&E H&E H&E H&E H&E H&E H&E H&E H&E H&E H&E H&E H&E H&E    CLINICAL HISTORY  SPECIMEN(S) OBTAINED 1. Colon, segmental resection for tumor, Right Colectomy, Colonic Mass  SPECIMEN COMMENTS: SPECIMEN CLINICAL INFORMATION: 1. Colonic mass    Gross Description 1. Specimen: "Right colectomy", received fresh and placed in formalin.      Specimen integrity: Intact.      Specimen length: Terminal ileum = 4.5 cm, cecum and ascending colon = 17.5 cm      Mesorectal intactness:      Not applicable.      Inking: Serosa = black      Tumor location: Proximal ascending colon.      Tumor size: 8.7 x 3.4 x 2.3 cm      Tumor descrption: Fungating and polypoid with a tan-white, friable cut surface.      Percent of bowel circumference involved: 67%      Tumor distance to margins:      Proximal: 7.4 cm      Distal: 13.0 cm      Radial (posterior ascending, posterior descending; lateral and posterior      mid-rectum;      and entire low er 1/3 rectum): 8.0 cm      Vascular pedicle: 10.0 cm      Macroscopic extent of tumor invasion:      Tumor appears limited to the mucosa.      Total presumed lymph nodes: 41, 0.2-1.2 cm in great dimension.      Extramural satellite tumor nodules: None.      Mucosal polyp(s): None.      Additional findings: The appendix is 6.1 cm long, 0.6 cm in diameter with      tan-purple and glistening serosa and unremarkable cut surfaces; the tumor does      not grossly involve the appendix. The mucosa surrounding the tumor is flattened.      Block  summary:      1A: Proximal margin, en face      1B: Distal margin, en face      1C: Vascular pedicle, en face      1D: Proximal tumor to uninvolved mucosa transition      1E: Distal tumor to uninvolved mucosa transition      42F: Deepest tumor extension      1G-1I: Addiitonal representative tumor      1J: Bisected appendiceal tip and representative cross-section      1K: Cross sections of flattened mucosa      1L: 6 complete lymph nod es      41M-1N: 5 complete lymph nodes in each      1O-1P: 3  complete lymph nodes in each      1Q-1T: 4 complete lymph nodes in each      1U: 1 complete lymph node      SMB      09/07/23        Report signed out from the following location(s) [place]. [facility] [address], [place] [address] CLIA #: [id number]  [facility] [address] [place], [place] [address] CLIA #: [id number]   ABO/Rh   Collection Time: 09/05/23 10:15 AM  Result Value Ref Range   ABO/RH(D)      A POS  Performed at [facility], [address] [place], [place] [address]   Basic metabolic panel   Collection Time: 09/06/23  4:32 AM  Result Value Ref Range   Sodium 137 135 - 145 mmol/L   Potassium 3.9 3.5 - 5.1 mmol/L   Chloride 108 98 - 111 mmol/L   CO2 22 22 - 32 mmol/L   Glucose, Bld 119 (H) 70 - 99 mg/dL   BUN 15 6 - 20 mg/dL   Creatinine, Ser 2.95 0.61 - 1.24 mg/dL   Calcium  8.5 (L) 8.9 - 10.3 mg/dL   GFR, Estimated >62 >13 mL/min   Anion gap 7 5 - 15  CBC   Collection Time: 09/06/23  4:32 AM  Result Value Ref Range   WBC 12.8 (H) 4.0 - 10.5 K/uL   RBC 4.73 4.22 - 5.81 MIL/uL   Hemoglobin 13.2 13.0 - 17.0 g/dL   HCT 08.6 (L) 57.8 - 46.9 %   MCV 81.4 80.0 - 100.0 fL   MCH 27.9 26.0 - 34.0 pg   MCHC 34.3 30.0 - 36.0 g/dL   RDW 62.9 52.8 - 41.3 %   Platelets 218 150 - 400 K/uL   nRBC 0.0 0.0 - 0.2 %  Magnesium    Collection Time: 09/06/23  4:32 AM  Result Value Ref Range   Magnesium  2.0 1.7 - 2.4 mg/dL   CBC   Collection Time: 09/07/23  4:30 AM  Result Value Ref Range   WBC 9.8 4.0 - 10.5 K/uL   RBC 4.21 (L) 4.22 - 5.81 MIL/uL   Hemoglobin 11.8 (L) 13.0 - 17.0 g/dL   HCT 24.4 (L) 01.0 - 27.2 %   MCV 82.4 80.0 - 100.0 fL   MCH 28.0 26.0 - 34.0 pg   MCHC 34.0 30.0 - 36.0 g/dL   RDW 53.6 64.4 - 03.4 %   Platelets 180 150 - 400 K/uL   nRBC 0.0 0.0 - 0.2 %  Basic metabolic panel   Collection Time: 09/07/23  4:30 AM  Result Value Ref Range   Sodium 140 135 - 145 mmol/L   Potassium 3.2 (L) 3.5 - 5.1 mmol/L   Chloride 109 98 - 111 mmol/L   CO2 23 22 - 32 mmol/L   Glucose, Bld 101 (H) 70 - 99 mg/dL   BUN 12 6 - 20 mg/dL   Creatinine, Ser 7.42 0.61 - 1.24 mg/dL   Calcium  8.0 (L) 8.9 - 10.3 mg/dL   GFR, Estimated >59 >56 mL/min   Anion gap 8 5 - 15      Assessment & Plan:   Problem List Items Addressed This Visit       Other   Dyslipidemia   Under good control on current regimen. Continue current regimen. Continue to monitor. Call with any concerns. Refills given. Labs drawn today.        Relevant Orders   Comprehensive metabolic panel with GFR   Lipid Panel w/o Chol/HDL Ratio   Other Visit Diagnoses       Nightmares    -  Primary   Much better on the prazpsin. Will increase to 2mg  and recheck 1 month. Call with any concerns.     Hyperglycemia       Will check A1c. Call with any concerns. Continue to monitor.   Relevant Orders   Bayer DCA Hb A1c Waived        Follow up plan: Return in about 4 weeks (around 11/06/2023) for virtual OK.

## 2023-10-10 LAB — LIPID PANEL W/O CHOL/HDL RATIO
Cholesterol, Total: 144 mg/dL (ref 100–199)
HDL: 41 mg/dL (ref >39)
LDL Chol Calc (NIH): 72 mg/dL (ref 0–99)
Triglycerides: 183 mg/dL — ABNORMAL HIGH (ref 0–149)
VLDL Cholesterol Cal: 31 mg/dL (ref 5–40)

## 2023-10-10 LAB — COMPREHENSIVE METABOLIC PANEL WITH GFR
ALT: 94 IU/L — ABNORMAL HIGH (ref 0–44)
AST: 45 IU/L — ABNORMAL HIGH (ref 0–40)
Albumin: 4.7 g/dL (ref 4.1–5.1)
Alkaline Phosphatase: 139 IU/L — ABNORMAL HIGH (ref 44–121)
BUN/Creatinine Ratio: 9 (ref 9–20)
BUN: 9 mg/dL (ref 6–24)
Bilirubin Total: 0.9 mg/dL (ref 0.0–1.2)
CO2: 22 mmol/L (ref 20–29)
Calcium: 9.9 mg/dL (ref 8.7–10.2)
Chloride: 105 mmol/L (ref 96–106)
Creatinine, Ser: 0.96 mg/dL (ref 0.76–1.27)
Globulin, Total: 2.4 g/dL (ref 1.5–4.5)
Glucose: 114 mg/dL — ABNORMAL HIGH (ref 70–99)
Potassium: 3.9 mmol/L (ref 3.5–5.2)
Sodium: 144 mmol/L (ref 134–144)
Total Protein: 7.1 g/dL (ref 6.0–8.5)
eGFR: 101 mL/min/{1.73_m2} (ref >59)

## 2023-10-11 ENCOUNTER — Encounter: Payer: Self-pay | Admitting: Family Medicine

## 2023-11-14 ENCOUNTER — Encounter: Payer: Self-pay | Admitting: Emergency Medicine

## 2023-11-14 ENCOUNTER — Other Ambulatory Visit: Payer: Self-pay

## 2023-11-14 ENCOUNTER — Encounter: Payer: Self-pay | Admitting: Family Medicine

## 2023-11-14 ENCOUNTER — Telehealth (INDEPENDENT_AMBULATORY_CARE_PROVIDER_SITE_OTHER): Payer: PRIVATE HEALTH INSURANCE | Admitting: Family Medicine

## 2023-11-14 ENCOUNTER — Emergency Department
Admission: EM | Admit: 2023-11-14 | Discharge: 2023-11-14 | Disposition: A | Discharge status: Home / Self Care | Payer: PRIVATE HEALTH INSURANCE | Attending: Emergency Medicine | Admitting: Emergency Medicine

## 2023-11-14 VITALS — Ht 65.0 in | Wt 188.0 lb

## 2023-11-14 DIAGNOSIS — S0990XA Unspecified injury of head, initial encounter: Secondary | ICD-10-CM | POA: Diagnosis not present

## 2023-11-14 DIAGNOSIS — W01198A Fall on same level from slipping, tripping and stumbling with subsequent striking against other object, initial encounter: Secondary | ICD-10-CM | POA: Insufficient documentation

## 2023-11-14 DIAGNOSIS — Y990 Civilian activity done for income or pay: Secondary | ICD-10-CM | POA: Diagnosis not present

## 2023-11-14 DIAGNOSIS — F515 Nightmare disorder: Secondary | ICD-10-CM | POA: Diagnosis not present

## 2023-11-14 DIAGNOSIS — W19XXXA Unspecified fall, initial encounter: Secondary | ICD-10-CM

## 2023-11-14 DIAGNOSIS — F339 Major depressive disorder, recurrent, unspecified: Secondary | ICD-10-CM | POA: Diagnosis not present

## 2023-11-14 DIAGNOSIS — F17290 Nicotine dependence, other tobacco product, uncomplicated: Secondary | ICD-10-CM | POA: Diagnosis not present

## 2023-11-14 DIAGNOSIS — Z23 Encounter for immunization: Secondary | ICD-10-CM | POA: Insufficient documentation

## 2023-11-14 DIAGNOSIS — S01112A Laceration without foreign body of left eyelid and periocular area, initial encounter: Secondary | ICD-10-CM | POA: Diagnosis present

## 2023-11-14 DIAGNOSIS — S0532XA Ocular laceration without prolapse or loss of intraocular tissue, left eye, initial encounter: Secondary | ICD-10-CM

## 2023-11-14 MED ORDER — LIDOCAINE HCL (PF) 1 % IJ SOLN
10.0000 mL | Freq: Once | INTRAMUSCULAR | Status: DC
  Filled 2023-11-14: qty 10

## 2023-11-14 MED ORDER — ATORVASTATIN CALCIUM 40 MG PO TABS: 40.0000 mg | ORAL_TABLET | Freq: Every day | ORAL | 1 refills | Status: DC

## 2023-11-14 MED ORDER — NICOTINE 14 MG/24HR TD PT24: 14.0000 mg | MEDICATED_PATCH | Freq: Every day | TRANSDERMAL | 2 refills | Status: DC

## 2023-11-14 MED ORDER — PRAZOSIN HCL 2 MG PO CAPS: 2.0000 mg | ORAL_CAPSULE | Freq: Every day | ORAL | 1 refills | Status: DC

## 2023-11-14 MED ORDER — OMEPRAZOLE 20 MG PO CPDR: 20.0000 mg | DELAYED_RELEASE_CAPSULE | Freq: Every day | ORAL | 1 refills | Status: DC

## 2023-11-14 MED ORDER — TETANUS-DIPHTH-ACELL PERTUSSIS 5-2.5-18.5 LF-MCG/0.5 IM SUSY
0.5000 mL | PREFILLED_SYRINGE | Freq: Once | INTRAMUSCULAR | Status: AC
Start: 2023-11-14 — End: 2023-11-14
  Administered 2023-11-14: 0.5 mL via INTRAMUSCULAR
  Filled 2023-11-14: qty 0.5

## 2023-11-14 MED ORDER — CITALOPRAM HYDROBROMIDE 40 MG PO TABS
40.0000 mg | ORAL_TABLET | Freq: Every day | ORAL | 1 refills | Status: DC
Start: 2023-11-14 — End: 2024-03-21

## 2023-11-14 MED ORDER — NICOTINE 7 MG/24HR TD PT24: 7.0000 mg | MEDICATED_PATCH | Freq: Every day | TRANSDERMAL | 2 refills | Status: DC

## 2023-11-14 NOTE — Progress Notes (Signed)
 Ht 5\' 5"  (1.651 m)   Wt 188 lb (85.3 kg)   BMI 31.28 kg/m    Subjective:    Patient ID: Rodney Myers, male    DOB: 1981/01/19, 43 y.o.   MRN: [id number]  HPI: Rodney Myers is a 43 y.o. male  Chief Complaint  Patient presents with   Nightmares    Pt states that the nightmares have decreased however, he is still having very vivid dreams.  Does feel like his anxiety has decreased also. Feels that he has "mellowed" out.    Doing much better with the nightmares. Dreams in general have gone down 50% and not having any more nightmares. Feeling much better. He is having some significant soreness in his belly when wearing his harness at work. Better when not wearing it and over the weekend.   SMOKING CESSATION Smoking Status: current every day smoker Smoking Amount: 1/2ppd Smoking Onset: as a teen Smoking Quit Date: not set Smoking triggers: stress, work, boredom Type of tobacco use: cigarettes Children in the house: no Other household members who smoke: no Treatments attempted: patches Pneumovax: up to date   Relevant past medical, surgical, family and social history reviewed and updated as indicated. Interim medical history since our last visit reviewed. Allergies and medications reviewed and updated.  Review of Systems  Constitutional: Negative.   Respiratory: Negative.    Cardiovascular: Negative.   Gastrointestinal:  Positive for abdominal pain. Negative for abdominal distention, anal bleeding, blood in stool, constipation, diarrhea, nausea, rectal pain and vomiting.    Per HPI unless specifically indicated above     Objective:     Ht 5\' 5"  (1.651 m)   Wt 188 lb (85.3 kg)   BMI 31.28 kg/m   Wt Readings from Last 3 Encounters:  11/14/23 188 lb (85.3 kg)  10/09/23 188 lb (85.3 kg)  09/18/23 182 lb (82.6 kg)    Physical Exam Vitals and nursing note reviewed.  Constitutional:      General: He is not in acute distress.    Appearance: Normal appearance.  He is not ill-appearing, toxic-appearing or diaphoretic.  HENT:     Head: Normocephalic and atraumatic.     Right Ear: External ear normal.     Left Ear: External ear normal.     Nose: Nose normal.     Mouth/Throat:     Mouth: Mucous membranes are moist.     Pharynx: Oropharynx is clear.  Eyes:     General: No scleral icterus.       Right eye: No discharge.        Left eye: No discharge.     Conjunctiva/sclera: Conjunctivae normal.     Pupils: Pupils are equal, round, and reactive to light.  Pulmonary:     Effort: Pulmonary effort is normal. No respiratory distress.     Comments: Speaking in full sentences Musculoskeletal:        General: Normal range of motion.     Cervical back: Normal range of motion.  Skin:    Coloration: Skin is not jaundiced or pale.     Findings: No bruising, erythema, lesion or rash.  Neurological:     Mental Status: He is alert and oriented to person, place, and time. Mental status is at baseline.  Psychiatric:        Mood and Affect: Mood normal.        Behavior: Behavior normal.        Thought Content: Thought content normal.  Judgment: Judgment normal.     Results for orders placed or performed in visit on 10/09/23  Comprehensive metabolic panel with GFR   Collection Time: 10/09/23  2:52 PM  Result Value Ref Range   Glucose 114 (H) 70 - 99 mg/dL   BUN 9 6 - 24 mg/dL   Creatinine, Ser 4.09 0.76 - 1.27 mg/dL   eGFR 811 >91 YN/WGN/5.62   BUN/Creatinine Ratio 9 9 - 20   Sodium 144 134 - 144 mmol/L   Potassium 3.9 3.5 - 5.2 mmol/L   Chloride 105 96 - 106 mmol/L   CO2 22 20 - 29 mmol/L   Calcium  9.9 8.7 - 10.2 mg/dL   Total Protein 7.1 6.0 - 8.5 g/dL   Albumin  4.7 4.1 - 5.1 g/dL   Globulin, Total 2.4 1.5 - 4.5 g/dL   Bilirubin Total 0.9 0.0 - 1.2 mg/dL   Alkaline Phosphatase 139 (H) 44 - 121 IU/L   AST 45 (H) 0 - 40 IU/L   ALT 94 (H) 0 - 44 IU/L  Lipid Panel w/o Chol/HDL Ratio   Collection Time: 10/09/23  2:52 PM  Result Value Ref  Range   Cholesterol, Total 144 100 - 199 mg/dL   Triglycerides 130 (H) 0 - 149 mg/dL   HDL 41 >86 mg/dL   VLDL Cholesterol Cal 31 5 - 40 mg/dL   LDL Chol Calc (NIH) 72 0 - 99 mg/dL  Bayer DCA Hb V7Q Waived   Collection Time: 10/09/23  2:52 PM  Result Value Ref Range   HB A1C (BAYER DCA - WAIVED) 5.5 4.8 - 5.6 %      Assessment & Plan:   Problem List Items Addressed This Visit       Other   Other tobacco product nicotine  dependence, uncomplicated   Will restart his nicotine  patches. Call with any concerns. Continue to monitor.       Relevant Medications   nicotine  (NICODERM CQ ) 14 mg/24hr patch   nicotine  (NICODERM CQ ) 7 mg/24hr patch   Depression, recurrent (HCC)   Relevant Medications   citalopram  (CELEXA ) 40 MG tablet   Other Visit Diagnoses       Nightmares    -  Primary   Doing great on the prazosin . Continue current regimen. Continue to monitor. Refills given.        Follow up plan: Return in about 4 months (around 03/21/2024) for physical.   This visit was completed via video visit through MyChart due to the restrictions of the COVID-19 pandemic. All issues as above were discussed and addressed. Physical exam was done as above through visual confirmation on video through MyChart. If it was felt that the patient should be evaluated in the office, they were directed there. The patient verbally consented to this visit. Location of the patient: parking lot Location of the provider: work Those involved with this call:  Provider: Terre Ferri, DO CMA: Linda Repress, CMA, Front Desk/Registration: Jaynee Meyer  Time spent on call: 15 minutes with patient face to face via video conference. More than 50% of this time was spent in counseling and coordination of care. 23 minutes total spent in review of patient's record and preparation of their chart.

## 2023-11-14 NOTE — ED Provider Notes (Signed)
 [facility] Provider Note    Event Date/Time   First MD Initiated Contact with Patient 11/14/23 1756     (approximate)   History   Fall   HPI Rodney Myers is a 43 y.o. male following a fall that occurred at work today 3-4 hours ago.  Patient states he was on a platform at work when he fell forward roughly 2-3 feet and onto some pallets.  He reports 2 cuts and bruising noted to his left eyelid.  Also reports headache.  Patient states he did have some vision changes that have went away since being roomed.  Pain is 3 out of 10.  Denies loss of consciousness, nausea, vomiting, severe headache, severe mechanism of injury.  He does not use blood thinners.  Past medical history includes depression, allergic rhinitis, GERD, anxiety. No allergies.    Patient is unaware when his last tetanus vaccine was.  Physical Exam   Triage Vital Signs: ED Triage Vitals  Encounter Vitals Group     BP 11/14/23 1642 (!) 154/99     Systolic BP Percentile --      Diastolic BP Percentile --      Pulse Rate 11/14/23 1642 94     Resp 11/14/23 1642 17     Temp 11/14/23 1642 98 F (36.7 C)     Temp Source 11/14/23 1642 Oral     SpO2 11/14/23 1642 94 %     Weight 11/14/23 1641 187 lb 6.3 oz (85 kg)     Height 11/14/23 1641 5\' 5"  (1.651 m)     Head Circumference --      Peak Flow --      Pain Score 11/14/23 1642 3     Pain Loc --      Pain Education --      Exclude from Growth Chart --     Most recent vital signs: Vitals:   11/14/23 1642  BP: (!) 154/99  Pulse: 94  Resp: 17  Temp: 98 F (36.7 C)  SpO2: 94%    General: Awake, no distress.  CV:  Good peripheral perfusion.  Resp:  Normal effort.  Abd:  No distention.  Other:  TMs intact bilaterally, diffuse cerumen to both ears.  No obvious deformities or swelling to the head.  Extraocular movements intact bilaterally.  No battle signs.  No afferent pupillary defect.  Pupils do constrict when presented with light  and accommodate.  Nontender to palpation of all facial bones.  Has 2 roughly 1/2 to 1 cm lacerations to his left eyelid with surrounding ecchymosis.  His eye is tearing some.  Please see media image below.    ED Results / Procedures / Treatments   Labs (all labs ordered are listed, but only abnormal results are displayed) Labs Reviewed - No data to display   EKG     RADIOLOGY   PROCEDURES:  Critical Care performed: No  .Laceration Repair  Date/Time: 11/14/2023 7:51 PM  Performed by: Thomasenia Flesher, PA-C Authorized by: Thomasenia Flesher, PA-C   Consent:    Consent obtained:  Verbal   Consent given by:  Patient   Risks, benefits, and alternatives were discussed: yes     Risks discussed:  Infection, need for additional repair, nerve damage, poor wound healing, poor cosmetic result, pain, retained foreign body, vascular damage and tendon damage Universal protocol:    Procedure explained and questions answered to patient or proxy's satisfaction: yes     Immediately prior to  procedure, a time out was called: yes     Patient identity confirmed:  Verbally with patient Anesthesia:    Anesthesia method:  Local infiltration   Local anesthetic:  Lidocaine  1% w/o epi Laceration details:    Location: left upper eyelid, 2 lacerations.   Length (cm):  1 Pre-procedure details:    Preparation:  Patient was prepped and draped in usual sterile fashion Exploration:    Hemostasis achieved with:  Direct pressure   Imaging outcome: foreign body not noted     Wound extent: areolar tissue not violated, fascia not violated, no foreign body, no signs of injury, no nerve damage, no tendon damage, no underlying fracture and no vascular damage     Contaminated: no   Treatment:    Area cleansed with:  Povidone-iodine   Amount of cleaning:  Standard   Irrigation solution:  Sterile saline   Irrigation method:  Syringe   Visualized foreign bodies/material removed: no     Debridement:  None    Undermining:  None   Scar revision: no   Skin repair:    Repair method:  Sutures   Suture size:  6-0   Suture material:  Nylon   Suture technique:  Simple interrupted   Number of sutures:  6 Approximation:    Approximation:  Close Repair type:    Repair type:  Simple Post-procedure details:    Dressing:  Adhesive bandage   Procedure completion:  Tolerated well, no immediate complications    MEDICATIONS ORDERED IN ED: Medications  lidocaine  (PF) (XYLOCAINE ) 1 % injection 10 mL (has no administration in time range)  Tdap (BOOSTRIX) injection 0.5 mL (0.5 mLs Intramuscular Given 11/14/23 1833)     IMPRESSION / MDM / ASSESSMENT AND PLAN / ED COURSE  I reviewed the triage vital signs and the nursing notes.                              Differential diagnosis includes, but is not limited to, fall, eyelid laceration, minor head injury, concussion, subdural hematoma, basal skull fracture  Patient's presentation is most consistent with acute, uncomplicated illness.  Patient presented today following a fall that occurred at work.  Blood pressure is elevated at 154/99, likely due to pain but all other vital signs within normal range.  Patient is alert and oriented, GCS of 15 the entire visit.  [place] head CT rule used, CT unnecessary at this time.  Patient's two lacerations to his left eyelid were repaired following anesthesia with lidocaine  1%, please see procedure note for full details.  Patient's headache did go away once lacerations were repaired.  Updated tetanus vaccine given.  Patient will need to follow-up in 5 to 7 days for suture removal with his primary care provider, urgent care, or in the emergency department. Work note given. Wound care instructions given.  Emergency department return precautions were discussed with the patient.  Patient is in agreement to the treatment plan.  Patient is stable for discharge.      FINAL CLINICAL IMPRESSION(S) / ED DIAGNOSES   Final  diagnoses:  Fall, initial encounter  Minor head injury, initial encounter  Eye laceration, left, initial encounter     Rx / DC Orders   ED Discharge Orders     None        Note:  This document was prepared using Dragon voice recognition software and may include unintentional dictation errors.    [place], Cressie Betzler,  PA-C 11/14/23 1953    Jacquie Maudlin, MD 11/14/23 [phone]

## 2023-11-14 NOTE — ED Triage Notes (Signed)
 Patient to ED via POV from [facility] for a fall. Pt reports tripping over some pallets. Laceration and bruising noted below left eyelid. Bleeding controlled. Denies LOC or blood thinners.

## 2023-11-14 NOTE — Assessment & Plan Note (Signed)
 Will restart his nicotine  patches. Call with any concerns. Continue to monitor.

## 2023-11-14 NOTE — Discharge Instructions (Signed)
You have been seen in the Emergency Department (ED) today for a laceration (cut).  Please keep the cut clean but do not submerge it in the water.  It has been repaired with staples or sutures that will need to be removed in about 5-7 days. Please follow up with your doctor, an urgent care, or return to the ED for suture removal.   ° °Please take Tylenol (acetaminophen) or Motrin (ibuprofen) as needed for discomfort as written on the box.  ° °Please follow up with your doctor as soon as possible regarding today's emergent visit.  ° °Return to the ED or call your doctor if you notice any signs of infection such as fever, increased pain, increased redness, pus, or other symptoms that concern you. ° °

## 2023-11-15 NOTE — Progress Notes (Signed)
 Appt scheduled

## 2023-11-30 ENCOUNTER — Encounter: Payer: Self-pay | Admitting: Family Medicine

## 2023-12-01 MED ORDER — NICOTINE 21 MG/24HR TD PT24: 21.0000 mg | MEDICATED_PATCH | Freq: Every day | TRANSDERMAL | 0 refills | Status: DC

## 2023-12-13 ENCOUNTER — Encounter: Payer: Self-pay | Admitting: Surgery

## 2023-12-13 ENCOUNTER — Ambulatory Visit (INDEPENDENT_AMBULATORY_CARE_PROVIDER_SITE_OTHER): Payer: PRIVATE HEALTH INSURANCE | Admitting: Surgery

## 2023-12-13 VITALS — BP 118/81 | HR 69 | Ht 65.0 in | Wt 198.0 lb

## 2023-12-13 DIAGNOSIS — Z09 Encounter for follow-up examination after completed treatment for conditions other than malignant neoplasm: Secondary | ICD-10-CM

## 2023-12-13 NOTE — Patient Instructions (Addendum)
 You will be due for a Colonoscopy in March 2028. We will let you know when you are due.    Follow-up with our office as needed.  Please call and ask to speak with a nurse if you develop questions or concerns.

## 2023-12-13 NOTE — Progress Notes (Signed)
 Outpatient Surgical Follow Up  12/13/2023  Rodney Myers is an 43 y.o. male.   Chief Complaint  Patient presents with   Follow-up    HPI: 43 yo s/p Lap R colectomy  3 1/2 months ago for unresectable 8cm polyp w high grade dysplasia. Doing well w/o evidence of complications. Tolerating PO, no hematochezia or melena. Back to nml. HE did have a recent fall at work w eyelid laceration.  I have reviewed recent ED visit and records.   Past Medical History:  Diagnosis Date   Allergy    Anxiety    GERD (gastroesophageal reflux disease)    History of kidney stones    Hypertension     Past Surgical History:  Procedure Laterality Date   COLONOSCOPY WITH PROPOFOL  N/A 08/08/2023   Procedure: COLONOSCOPY WITH PROPOFOL ;  Surgeon: Jinny Carmine, MD;  Location: [facility] ENDOSCOPY;  Service: Endoscopy;  Laterality: N/A;   ESOPHAGOGASTRODUODENOSCOPY (EGD) WITH PROPOFOL  N/A 11/04/2021   Procedure: ESOPHAGOGASTRODUODENOSCOPY (EGD) WITH PROPOFOL ;  Surgeon: Jinny Carmine, MD;  Location: [facility] ENDOSCOPY;  Service: Endoscopy;  Laterality: N/A;   LAPAROSCOPIC RIGHT COLECTOMY N/A 09/05/2023   Procedure: COLECTOMY, RIGHT, LAPAROSCOPIC;  Surgeon: Jordis Laneta FALCON, MD;  Location: [facility] ORS;  Service: General;  Laterality: N/A;   ROOT CANAL  07/2014    Family History  Problem Relation Age of Onset   Cancer Maternal Grandfather        nasal   Hypertension Mother    Other Father    Diabetes Maternal Grandmother    Heart disease Paternal Grandfather     Social History:  reports that he quit smoking about 18 months ago. His smoking use included cigarettes. He has been exposed to tobacco smoke. He has never used smokeless tobacco. He reports that he does not currently use alcohol. He reports that he does not currently use drugs after having used the following drugs: Marijuana.  Allergies:  Allergies  Allergen Reactions   Wellbutrin  [Bupropion ] Other (See Comments)    irritablity    Medications  reviewed.    ROS Full ROS performed and is otherwise negative other than what is stated in HPI   Ht 5' 5 (1.651 m)   BMI 31.18 kg/m   Physical Exam Vitals and nursing note reviewed. Exam conducted with a chaperone present.  Constitutional:      General: He is not in acute distress.    Appearance: Normal appearance.  Cardiovascular:     Rate and Rhythm: Normal rate and regular rhythm.  Pulmonary:     Effort: Pulmonary effort is normal. No respiratory distress.     Breath sounds: Normal breath sounds. No stridor.  Abdominal:     General: Abdomen is flat. There is no distension.     Palpations: There is no mass.     Tenderness: There is no abdominal tenderness. There is no guarding or rebound.     Hernia: No hernia is present.     Comments: Midline laparotomy scar . No peritonitis.   Musculoskeletal:     Cervical back: Normal range of motion and neck supple. No rigidity.  Skin:    General: Skin is warm and dry.     Capillary Refill: Capillary refill takes less than 2 seconds.  Neurological:     General: No focal deficit present.     Mental Status: He is alert and oriented to person, place, and time.  Psychiatric:        Mood and Affect: Mood normal.  Behavior: Behavior normal.        Thought Content: Thought content normal.        Judgment: Judgment normal.     Assessment/Plan:  43 yo s/p Lap R colectomy for unresectable 8cm polyp w high grade dysplasia. Doing well w/o evidence of complications. D/w the pt in detail about the recommendation for colonoscopy in 3 years or earlier if he starts to develop sxs. He understands We have arranged for colonoscopy 3 years I personally spent a total of 30 minutes in the care of the patient today including performing a medically appropriate exam/evaluation, counseling and educating, placing orders, referring and communicating with other health care professionals, documenting clinical information in the EHR, independently  interpreting and reviewing images studies and coordinating care.   [name], MD [facility] General Surgeon

## 2024-02-23 ENCOUNTER — Ambulatory Visit: Payer: PRIVATE HEALTH INSURANCE | Admitting: Family Medicine

## 2024-02-23 ENCOUNTER — Encounter: Payer: Self-pay | Admitting: Family Medicine

## 2024-02-23 ENCOUNTER — Ambulatory Visit: Payer: Self-pay

## 2024-02-23 VITALS — BP 128/84 | HR 77 | Ht 65.0 in | Wt 203.0 lb

## 2024-02-23 DIAGNOSIS — J029 Acute pharyngitis, unspecified: Secondary | ICD-10-CM

## 2024-02-23 LAB — POC COVID19 BINAXNOW: SARS Coronavirus 2 Ag: NEGATIVE

## 2024-02-23 MED ORDER — AZITHROMYCIN 250 MG PO TABS: ORAL_TABLET | ORAL | 0 refills | Status: AC

## 2024-02-23 NOTE — Telephone Encounter (Signed)
 FYI Only or Action Required?: FYI only for provider.  Patient was last seen in primary care on 11/14/2023 by Vicci Bouchard P, DO.  Called Nurse Triage reporting Sore Throat.  Symptoms began several days ago.  Interventions attempted: Rest, hydration, or home remedies.  Symptoms are: gradually worsening.  Triage Disposition: See PCP When Office is Open (Within 3 Days)  Patient/caregiver understands and will follow disposition?: Yes        Copied from CRM #[id number]. Topic: Clinical - Red Word Triage >> Feb 23, 2024 11:15 AM Rodney Myers wrote: Red Word that prompted transfer to Nurse Triage: Difficulty swallowing, sore throat, swollen tonsils, pain. Seeking appt today          Reason for Disposition  [1] Sore throat is the only symptom AND [2] present > 48 hours  Answer Assessment - Initial Assessment Questions 1. ONSET: When did the throat start hurting? (Hours or days ago)      4 days ago  2. SEVERITY: How bad is the sore throat? (Scale 1-10; mild, moderate or severe)     Moderate  3. STREP EXPOSURE: Has there been any exposure to strep within the past week? If Yes, ask: What type of contact occurred?      Unsure  4.  VIRAL SYMPTOMS: Are there any symptoms of a cold, such as a runny nose, cough, hoarse voice or red eyes?      Mild runny nose  5. FEVER: Do you have a fever? If Yes, ask: What is your temperature, how was it measured, and when did it start?     No 6. PUS ON THE TONSILS: Is there pus on the tonsils in the back of your throat?     Unable to see 7. OTHER SYMPTOMS: Do you have any other symptoms? (e.g., difficulty breathing, headache, rash)     Pain with swallowing and eating, swollen tonsils  Protocols used: Sore Throat-A-AH

## 2024-02-23 NOTE — Addendum Note (Signed)
 Addended by: CHESLEY NIELS CROME on: 02/23/2024 04:01 PM   Modules accepted: Orders

## 2024-02-23 NOTE — Telephone Encounter (Signed)
 Noted

## 2024-02-23 NOTE — Progress Notes (Signed)
 Acute Office Visit  Subjective:     Patient ID: Rodney Myers, male    DOB: 02-Jan-1981, 43 y.o.   MRN: [id number]  Chief Complaint  Patient presents with   Sore Throat    Sore throat since Monday. Patient did have some fatigue a couple days ago.     HPI Patient is in today for soar throat. Discussed the use of AI scribe software for clinical note transcription with the patient, who gave verbal consent to proceed.  History of Present Illness BYRL LATIN is a 43 year old male who presents with a sore throat.  He has been experiencing a severe sore throat since arriving in [place] on Monday, describing the pain as intense and persistent throughout the week. No fever, chills, rash, diarrhea, or breathing difficulties are present. He notes a mild runny nose but denies cough, earaches, or headaches.  He has a history of tonsillar stones, which he describes as feeling like a 'giant marble' in his throat, exacerbating discomfort when swallowing or eating. These symptoms have not previously occurred with his current sore throat.  He took Tylenol  before leaving [place], which alleviated a headache but did not relieve the throat pain. He uses a nicotine  patch and has not tested for COVID-19 at home.    Review of Systems  All other systems reviewed and are negative.       Objective:    BP 128/84   Pulse 77   Ht 5' 5 (1.651 m)   Wt 203 lb (92.1 kg)   SpO2 98%   BMI 33.78 kg/m    Physical Exam Vitals and nursing note reviewed.  Constitutional:      Appearance: Normal appearance.  HENT:     Head: Normocephalic.     Right Ear: External ear normal.     Left Ear: External ear normal.  Eyes:     Conjunctiva/sclera: Conjunctivae normal.  Cardiovascular:     Rate and Rhythm: Normal rate.  Pulmonary:     Effort: Pulmonary effort is normal. No respiratory distress.  Abdominal:     Palpations: Abdomen is soft.  Musculoskeletal:        General: Normal range of motion.   Skin:    General: Skin is warm.  Neurological:     Mental Status: He is alert and oriented to person, place, and time.  Psychiatric:        Mood and Affect: Mood normal.     No results found for any visits on 02/23/24.      Assessment & Plan:   Problem List Items Addressed This Visit   None Visit Diagnoses       Sorethroat    -  Primary   Relevant Medications   azithromycin  (ZITHROMAX ) 250 MG tablet       Meds ordered this encounter  Medications   azithromycin  (ZITHROMAX ) 250 MG tablet    Sig: Take 2 tablets on day 1, then 1 tablet daily on days 2 through 5    Dispense:  6 tablet    Refill:  0  Assessment and Plan Assessment & Plan Acute pharyngitis Acute sore throat and mild rhinorrhea for one week. Differential includes viral pharyngitis, streptococcal pharyngitis, and COVID-19. COVID-19 test negative - Prescribe azithromycin  (Z-Pak). - Advise salt water  gargling. - Recommend hydration. - Suggest taking vitamins, including zinc and vitamin D. - Advise taking acetaminophen  for pain. - Await COVID-19 test results.  Tonsilloliths (tonsil stones) Chronic tonsil stones with recent odynophagia. -  Advise salt water  gargling. - Recommend hydration. - Refer to ENT for potential removal if symptoms persist.    No follow-ups on file.  Vinary K Kinga Cassar, MD

## 2024-03-21 ENCOUNTER — Encounter: Payer: Self-pay | Admitting: Family Medicine

## 2024-03-21 ENCOUNTER — Ambulatory Visit: Payer: PRIVATE HEALTH INSURANCE | Admitting: Family Medicine

## 2024-03-21 VITALS — BP 135/89 | HR 74 | Temp 97.7°F | Ht 65.0 in | Wt 209.1 lb

## 2024-03-21 DIAGNOSIS — Z23 Encounter for immunization: Secondary | ICD-10-CM | POA: Diagnosis not present

## 2024-03-21 DIAGNOSIS — Z0000 Encounter for general adult medical examination without abnormal findings: Secondary | ICD-10-CM | POA: Diagnosis not present

## 2024-03-21 DIAGNOSIS — F17290 Nicotine dependence, other tobacco product, uncomplicated: Secondary | ICD-10-CM

## 2024-03-21 DIAGNOSIS — E785 Hyperlipidemia, unspecified: Secondary | ICD-10-CM | POA: Diagnosis not present

## 2024-03-21 DIAGNOSIS — F339 Major depressive disorder, recurrent, unspecified: Secondary | ICD-10-CM | POA: Diagnosis not present

## 2024-03-21 DIAGNOSIS — J029 Acute pharyngitis, unspecified: Secondary | ICD-10-CM | POA: Diagnosis not present

## 2024-03-21 MED ORDER — LIDOCAINE VISCOUS HCL 2 % MT SOLN: 15.0000 mL | OROMUCOSAL | 0 refills | Status: AC | PRN

## 2024-03-21 MED ORDER — OMEPRAZOLE 20 MG PO CPDR: 20.0000 mg | DELAYED_RELEASE_CAPSULE | Freq: Every day | ORAL | 1 refills | Status: AC

## 2024-03-21 MED ORDER — NICOTINE 21 MG/24HR TD PT24: 21.0000 mg | MEDICATED_PATCH | Freq: Every day | TRANSDERMAL | 6 refills | Status: AC

## 2024-03-21 MED ORDER — ATORVASTATIN CALCIUM 40 MG PO TABS: 40.0000 mg | ORAL_TABLET | Freq: Every day | ORAL | 1 refills | Status: AC

## 2024-03-21 MED ORDER — PRAZOSIN HCL 2 MG PO CAPS: 2.0000 mg | ORAL_CAPSULE | Freq: Every day | ORAL | 1 refills | Status: AC

## 2024-03-21 MED ORDER — CITALOPRAM HYDROBROMIDE 40 MG PO TABS: 40.0000 mg | ORAL_TABLET | Freq: Every day | ORAL | 1 refills | Status: AC

## 2024-03-21 NOTE — Progress Notes (Unsigned)
 BP 135/89   Pulse 74   Temp 97.7 F (36.5 C) (Oral)   Ht 5' 5 (1.651 m)   Wt 209 lb 2 oz (94.9 kg)   SpO2 98%   BMI 34.80 kg/m    Subjective:    Patient ID: Rodney Myers, male    DOB: 18-Jun-1980, 43 y.o.   MRN: [id number]  HPI: Rodney Myers is a 43 y.o. male presenting on 03/21/2024 for comprehensive medical examination. Current medical complaints include:  HYPERLIPIDEMIA Hyperlipidemia status: excellent compliance Satisfied with current treatment?  {Blank single:19197::yes,no} Side effects:  {Blank single:19197::yes,no} Medication compliance: {Blank single:19197::excellent compliance,good compliance,fair compliance,poor compliance} Past cholesterol meds: {Blank multiple:19196::none,atorvastain (lipitor),lovastatin (mevacor),pravastatin (pravachol),rosuvastatin (crestor),simvastatin (zocor),vytorin,fenofibrate (tricor),gemfibrozil,ezetimide (zetia),niaspan,lovaza} Supplements: {Blank multiple:19196::none,fish oil,niacin,red yeast rice} Aspirin:  {Blank single:19197::yes,no} The 10-year ASCVD risk score (Arnett DK, et al., 2019) is: 3.4%   Values used to calculate the score:     Age: 47 years     Clincally relevant sex: Male     Is Non-Hispanic African American: No     Diabetic: No     Tobacco smoker: Yes     Systolic Blood Pressure: 135 mmHg     Is BP treated: No     HDL Cholesterol: 41 mg/dL     Total Cholesterol: 144 mg/dL Chest pain:  {Blank dpwhoz:80802::bzd,wn} Coronary artery disease:  {Blank single:19197::yes,no} Family history CAD:  {Blank single:19197::yes,no} Family history early CAD:  {Blank single:19197::yes,no}  ANXIETY/STRESS Duration:{Blank single:19197::controlled,uncontrolled,better,worse,exacerbated,stable} Anxious mood: {Blank single:19197::yes,no}  Excessive worrying: {Blank single:19197::yes,no} Irritability: {Blank single:19197::yes,no}  Sweating:  {Blank single:19197::yes,no} Nausea: {Blank single:19197::yes,no} Palpitations:{Blank single:19197::yes,no} Hyperventilation: {Blank single:19197::yes,no} Panic attacks: {Blank single:19197::yes,no} Agoraphobia: {Blank single:19197::yes,no}  Obscessions/compulsions: {Blank single:19197::yes,no} Depressed mood: {Blank single:19197::yes,no}    03/21/2024    3:32 PM 02/23/2024    3:25 PM 10/09/2023    2:37 PM 09/04/2023   10:16 AM 08/17/2023    3:48 PM  Depression screen PHQ 2/9  Decreased Interest 0 0 0 0 1  Down, Depressed, Hopeless 0 0 0 1 2  PHQ - 2 Score 0 0 0 1 3  Altered sleeping 0  2 3 2   Tired, decreased energy 1  1 0 2  Change in appetite 0  0 0 2  Feeling bad or failure about yourself  0  0 1 2  Trouble concentrating 0  0 0 1  Moving slowly or fidgety/restless 0  0 0 0  Suicidal thoughts 0  0 0 0  PHQ-9 Score 1  3 5 12    Anhedonia: {Blank single:19197::yes,no} Weight changes: {Blank single:19197::yes,no} Insomnia: {Blank single:19197::yes,no} {Blank single:19197::hard to fall asleep,hard to stay asleep}  Hypersomnia: {Blank single:19197::yes,no} Fatigue/loss of energy: {Blank single:19197::yes,no} Feelings of worthlessness: {Blank single:19197::yes,no} Feelings of guilt: {Blank single:19197::yes,no} Impaired concentration/indecisiveness: {Blank single:19197::yes,no} Suicidal ideations: {Blank single:19197::yes,no}  Crying spells: {Blank single:19197::yes,no} Recent Stressors/Life Changes: {Blank single:19197::yes,no}   Relationship problems: {Blank single:19197::yes,no}   Family stress: {Blank single:19197::yes,no}     Financial stress: {Blank single:19197::yes,no}    Job stress: {Blank single:19197::yes,no}    Recent death/loss: {Blank single:19197::yes,no}   He currently lives with: Interim Problems from his last visit: {Blank single:19197::yes,no}  Depression  Screen done today and results listed below:     03/21/2024    3:32 PM 02/23/2024    3:25 PM 10/09/2023    2:37 PM 09/04/2023   10:16 AM 08/17/2023    3:48 PM  Depression screen PHQ 2/9  Decreased Interest 0 0 0 0 1  Down, Depressed, Hopeless 0 0 0 1 2  PHQ -  2 Score 0 0 0 1 3  Altered sleeping 0  2 3 2   Tired, decreased energy 1  1 0 2  Change in appetite 0  0 0 2  Feeling bad or failure about yourself  0  0 1 2  Trouble concentrating 0  0 0 1  Moving slowly or fidgety/restless 0  0 0 0  Suicidal thoughts 0  0 0 0  PHQ-9 Score 1  3 5 12     The patient {has/does not yjcz:80150} a history of falls. I {did/did not:19850} complete a risk assessment for falls. A plan of care for falls {was/was not:19852} documented.   Past Medical History:  Past Medical History:  Diagnosis Date   Allergy    Anxiety    GERD (gastroesophageal reflux disease)    History of kidney stones    Hypertension     Surgical History:  Past Surgical History:  Procedure Laterality Date   COLONOSCOPY WITH PROPOFOL  N/A 08/08/2023   Procedure: COLONOSCOPY WITH PROPOFOL ;  Surgeon: Jinny Carmine, MD;  Location: [facility] ENDOSCOPY;  Service: Endoscopy;  Laterality: N/A;   ESOPHAGOGASTRODUODENOSCOPY (EGD) WITH PROPOFOL  N/A 11/04/2021   Procedure: ESOPHAGOGASTRODUODENOSCOPY (EGD) WITH PROPOFOL ;  Surgeon: Jinny Carmine, MD;  Location: [facility] ENDOSCOPY;  Service: Endoscopy;  Laterality: N/A;   LAPAROSCOPIC RIGHT COLECTOMY N/A 09/05/2023   Procedure: COLECTOMY, RIGHT, LAPAROSCOPIC;  Surgeon: Jordis Laneta FALCON, MD;  Location: [facility] ORS;  Service: General;  Laterality: N/A;   ROOT CANAL  07/2014    Medications:  Current Outpatient Medications on File Prior to Visit  Medication Sig   atorvastatin  (LIPITOR) 40 MG tablet Take 1 tablet (40 mg total) by mouth daily.   citalopram  (CELEXA ) 40 MG tablet Take 1 tablet (40 mg total) by mouth daily.   nicotine  (NICODERM CQ ) 21 mg/24hr patch Place 1 patch (21 mg total) onto the skin daily.    omeprazole  (PRILOSEC) 20 MG capsule Take 1 capsule (20 mg total) by mouth daily before breakfast.   prazosin  (MINIPRESS ) 2 MG capsule Take 1 capsule (2 mg total) by mouth at bedtime.   nicotine  (NICODERM CQ ) 14 mg/24hr patch Place 1 patch (14 mg total) onto the skin daily. (Patient not taking: Reported on 03/21/2024)   nicotine  (NICODERM CQ ) 7 mg/24hr patch Place 1 patch (7 mg total) onto the skin daily. (Patient not taking: Reported on 03/21/2024)   No current facility-administered medications on file prior to visit.    Allergies:  Allergies  Allergen Reactions   Wellbutrin  [Bupropion ] Other (See Comments)    irritablity    Social History:  Social History   Socioeconomic History   Marital status: Married    Spouse name: Not on file   Number of children: Not on file   Years of education: Not on file   Highest education level: Not on file  Occupational History   Not on file  Tobacco Use   Smoking status: Every Day    Current packs/day: 0.00    Types: Cigarettes    Last attempt to quit: 2024    Years since quitting: 1.7    Passive exposure: Past   Smokeless tobacco: Current  Vaping Use   Vaping status: Former  Substance and Sexual Activity   Alcohol use: Not Currently    Comment: pt states very rare   Drug use: Not Currently    Types: Marijuana   Sexual activity: Never  Other Topics Concern   Not on file  Social History Narrative   Not on file  Social Drivers of Corporate investment banker Strain: Not on file  Food Insecurity: No Food Insecurity (09/05/2023)   Hunger Vital Sign    Worried About Running Out of Food in the Last Year: Never true    Ran Out of Food in the Last Year: Never true  Transportation Needs: No Transportation Needs (09/05/2023)   PRAPARE - Administrator, Civil Service (Medical): No    Lack of Transportation (Non-Medical): No  Physical Activity: Not on file  Stress: Not on file  Social Connections: Not on file  Intimate Partner  Violence: Not At Risk (09/05/2023)   Humiliation, Afraid, Rape, and Kick questionnaire    Fear of Current or Ex-Partner: No    Emotionally Abused: No    Physically Abused: No    Sexually Abused: No   Social History   Tobacco Use  Smoking Status Every Day   Current packs/day: 0.00   Types: Cigarettes   Last attempt to quit: 2024   Years since quitting: 1.7   Passive exposure: Past  Smokeless Tobacco Current   Social History   Substance and Sexual Activity  Alcohol Use Not Currently   Comment: pt states very rare    Family History:  Family History  Problem Relation Age of Onset   Cancer Maternal Grandfather        nasal   Hypertension Mother    Other Father    Diabetes Maternal Grandmother    Heart disease Paternal Grandfather     Past medical history, surgical history, medications, allergies, family history and social history reviewed with patient today and changes made to appropriate areas of the chart.   Review of Systems  Constitutional: Negative.   HENT:  Positive for ear pain and sore throat. Negative for congestion, ear discharge, hearing loss, nosebleeds, sinus pain and tinnitus.   Eyes: Negative.   Respiratory:  Positive for cough. Negative for hemoptysis, sputum production, shortness of breath, wheezing and stridor.   Cardiovascular: Negative.   Gastrointestinal:  Positive for constipation and heartburn. Negative for abdominal pain, blood in stool, diarrhea, melena, nausea and vomiting.  Genitourinary: Negative.   Musculoskeletal: Negative.   Skin: Negative.   Neurological: Negative.   Endo/Heme/Allergies: Negative.   Psychiatric/Behavioral: Negative.     All other ROS negative except what is listed above and in the HPI.      Objective:    BP 135/89   Pulse 74   Temp 97.7 F (36.5 C) (Oral)   Ht 5' 5 (1.651 m)   Wt 209 lb 2 oz (94.9 kg)   SpO2 98%   BMI 34.80 kg/m   Wt Readings from Last 3 Encounters:  03/21/24 209 lb 2 oz (94.9 kg)   02/23/24 203 lb (92.1 kg)  12/13/23 198 lb (89.8 kg)    Physical Exam Vitals and nursing note reviewed.  Constitutional:      General: He is not in acute distress.    Appearance: Normal appearance. He is obese. He is not ill-appearing, toxic-appearing or diaphoretic.  HENT:     Head: Normocephalic and atraumatic.     Right Ear: Tympanic membrane, ear canal and external ear normal. There is no impacted cerumen.     Left Ear: Tympanic membrane, ear canal and external ear normal. There is no impacted cerumen.     Nose: Nose normal. No congestion or rhinorrhea.     Mouth/Throat:     Mouth: Mucous membranes are moist.     Pharynx: Oropharynx is  clear. Posterior oropharyngeal erythema present. No oropharyngeal exudate.  Eyes:     General: No scleral icterus.       Right eye: No discharge.        Left eye: No discharge.     Extraocular Movements: Extraocular movements intact.     Conjunctiva/sclera: Conjunctivae normal.     Pupils: Pupils are equal, round, and reactive to light.  Neck:     Vascular: No carotid bruit.  Cardiovascular:     Rate and Rhythm: Normal rate and regular rhythm.     Pulses: Normal pulses.     Heart sounds: No murmur heard.    No friction rub. No gallop.  Pulmonary:     Effort: Pulmonary effort is normal. No respiratory distress.     Breath sounds: Normal breath sounds. No stridor. No wheezing, rhonchi or rales.  Chest:     Chest wall: No tenderness.  Abdominal:     General: Abdomen is flat. Bowel sounds are normal. There is no distension.     Palpations: Abdomen is soft. There is no mass.     Tenderness: There is no abdominal tenderness. There is no right CVA tenderness, left CVA tenderness, guarding or rebound.     Hernia: No hernia is present.  Genitourinary:    Comments: Genital exam deferred with shared decision making Musculoskeletal:        General: No swelling, tenderness, deformity or signs of injury.     Cervical back: Normal range of motion and  neck supple. No rigidity. No muscular tenderness.     Right lower leg: No edema.     Left lower leg: No edema.  Lymphadenopathy:     Cervical: No cervical adenopathy.  Skin:    General: Skin is warm and dry.     Capillary Refill: Capillary refill takes less than 2 seconds.     Coloration: Skin is not jaundiced or pale.     Findings: No bruising, erythema, lesion or rash.  Neurological:     General: No focal deficit present.     Mental Status: He is alert and oriented to person, place, and time.     Cranial Nerves: No cranial nerve deficit.     Sensory: No sensory deficit.     Motor: No weakness.     Coordination: Coordination normal.     Gait: Gait normal.     Deep Tendon Reflexes: Reflexes normal.  Psychiatric:        Mood and Affect: Mood normal.        Behavior: Behavior normal.        Thought Content: Thought content normal.        Judgment: Judgment normal.     Results for orders placed or performed in visit on 02/23/24  POC COVID-19   Collection Time: 02/23/24  4:00 PM  Result Value Ref Range   SARS Coronavirus 2 Ag Negative Negative      Assessment & Plan:   Problem List Items Addressed This Visit   None Visit Diagnoses       Routine general medical examination at a health care facility    -  Primary   Relevant Orders   Comprehensive metabolic panel with GFR   CBC with Differential/Platelet   Lipid Panel w/o Chol/HDL Ratio   TSH   Hepatitis B surface antibody,quantitative        LABORATORY TESTING:  Health maintenance labs ordered today as discussed above.   IMMUNIZATIONS:   - Tdap: Tetanus vaccination  status reviewed: last tetanus booster within 10 years. - Influenza: Administered today - Prevnar: Administered today - COVID: Refused - HPV: Refused  PATIENT COUNSELING:    Sexuality: Discussed sexually transmitted diseases, partner selection, use of condoms, avoidance of unintended pregnancy  and contraceptive alternatives.   Advised to avoid  cigarette smoking.  I discussed with the patient that most people either abstain from alcohol or drink within safe limits (<=14/week and <=4 drinks/occasion for males, <=7/weeks and <= 3 drinks/occasion for females) and that the risk for alcohol disorders and other health effects rises proportionally with the number of drinks per week and how often a drinker exceeds daily limits.  Discussed cessation/primary prevention of drug use and availability of treatment for abuse.   Diet: Encouraged to adjust caloric intake to maintain  or achieve ideal body weight, to reduce intake of dietary saturated fat and total fat, to limit sodium intake by avoiding high sodium foods and not adding table salt, and to maintain adequate dietary potassium and calcium  preferably from fresh fruits, vegetables, and low-fat dairy products.    stressed the importance of regular exercise  Injury prevention: Discussed safety belts, safety helmets, smoke detector, smoking near bedding or upholstery.   Dental health: Discussed importance of regular tooth brushing, flossing, and dental visits.   Follow up plan: NEXT PREVENTATIVE PHYSICAL DUE IN 1 YEAR. No follow-ups on file.

## 2024-03-22 ENCOUNTER — Ambulatory Visit: Payer: Self-pay | Admitting: Family Medicine

## 2024-03-22 ENCOUNTER — Encounter: Payer: Self-pay | Admitting: Family Medicine

## 2024-03-22 LAB — LIPID PANEL W/O CHOL/HDL RATIO
Cholesterol, Total: 162 mg/dL (ref 100–199)
HDL: 28 mg/dL — ABNORMAL LOW (ref >39)
LDL Chol Calc (NIH): 66 mg/dL (ref 0–99)
Triglycerides: 439 mg/dL — ABNORMAL HIGH (ref 0–149)
VLDL Cholesterol Cal: 68 mg/dL — ABNORMAL HIGH (ref 5–40)

## 2024-03-22 LAB — COMPREHENSIVE METABOLIC PANEL WITH GFR
ALT: 71 IU/L — ABNORMAL HIGH (ref 0–44)
AST: 34 IU/L (ref 0–40)
Albumin: 4.4 g/dL (ref 4.1–5.1)
Alkaline Phosphatase: 164 IU/L — ABNORMAL HIGH (ref 47–123)
BUN/Creatinine Ratio: 9 (ref 9–20)
BUN: 9 mg/dL (ref 6–24)
Bilirubin Total: 0.8 mg/dL (ref 0.0–1.2)
CO2: 20 mmol/L (ref 20–29)
Calcium: 9.1 mg/dL (ref 8.7–10.2)
Chloride: 102 mmol/L (ref 96–106)
Creatinine, Ser: 0.96 mg/dL (ref 0.76–1.27)
Globulin, Total: 2.8 g/dL (ref 1.5–4.5)
Glucose: 91 mg/dL (ref 70–99)
Potassium: 4 mmol/L (ref 3.5–5.2)
Sodium: 139 mmol/L (ref 134–144)
Total Protein: 7.2 g/dL (ref 6.0–8.5)
eGFR: 101 mL/min/1.73 (ref >59)

## 2024-03-22 LAB — CBC WITH DIFFERENTIAL/PLATELET
Basophils Absolute: 0.1 x10E3/uL (ref 0.0–0.2)
Basos: 1 %
EOS (ABSOLUTE): 0.5 x10E3/uL — ABNORMAL HIGH (ref 0.0–0.4)
Eos: 7 %
Hematocrit: 42.7 % (ref 37.5–51.0)
Hemoglobin: 14.1 g/dL (ref 13.0–17.7)
Immature Grans (Abs): 0 x10E3/uL (ref 0.0–0.1)
Immature Granulocytes: 0 %
Lymphocytes Absolute: 2.6 x10E3/uL (ref 0.7–3.1)
Lymphs: 36 %
MCH: 26.8 pg (ref 26.6–33.0)
MCHC: 33 g/dL (ref 31.5–35.7)
MCV: 81 fL (ref 79–97)
Monocytes Absolute: 0.8 x10E3/uL (ref 0.1–0.9)
Monocytes: 11 %
Neutrophils Absolute: 3.2 x10E3/uL (ref 1.4–7.0)
Neutrophils: 45 %
Platelets: 225 x10E3/uL (ref 150–450)
RBC: 5.26 x10E6/uL (ref 4.14–5.80)
RDW: 15 % (ref 11.6–15.4)
WBC: 7.1 x10E3/uL (ref 3.4–10.8)

## 2024-03-22 LAB — HEPATITIS B SURFACE ANTIBODY, QUANTITATIVE: Hepatitis B Surf Ab Quant: 14045 m[IU]/mL

## 2024-03-22 LAB — TSH: TSH: 2.09 u[IU]/mL (ref 0.450–4.500)

## 2024-03-22 NOTE — Assessment & Plan Note (Signed)
 Under good control on current regimen. Continue current regimen. Continue to monitor. Call with any concerns. Refills given. Labs drawn today.

## 2024-03-22 NOTE — Assessment & Plan Note (Signed)
 Continue nicoderm. Continue to monitor. Call with any concerns.

## 2024-04-04 IMAGING — DX DG RIBS 2V*R*
4 series · 4 of 4 positions shown · non-contrast
Comparison: None.

CLINICAL DATA: Rib tenderness, fall 1 month ago

EXAM:
LEFT RIBS - 2 VIEW; RIGHT RIBS - 2 VIEW

[rib pa (1 of 2)]
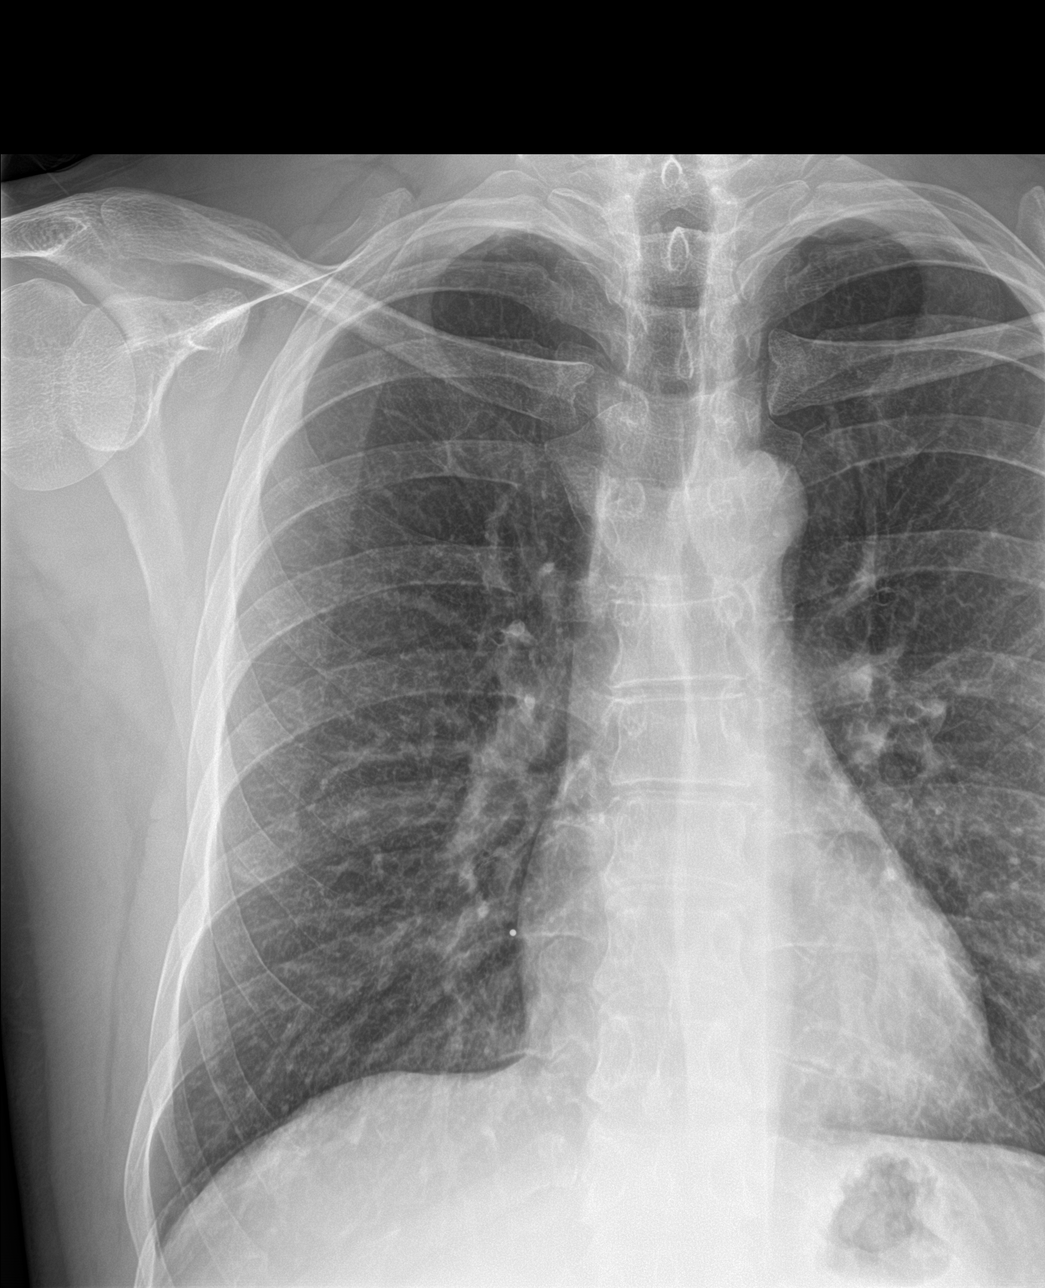

[rib pa (2 of 2)]
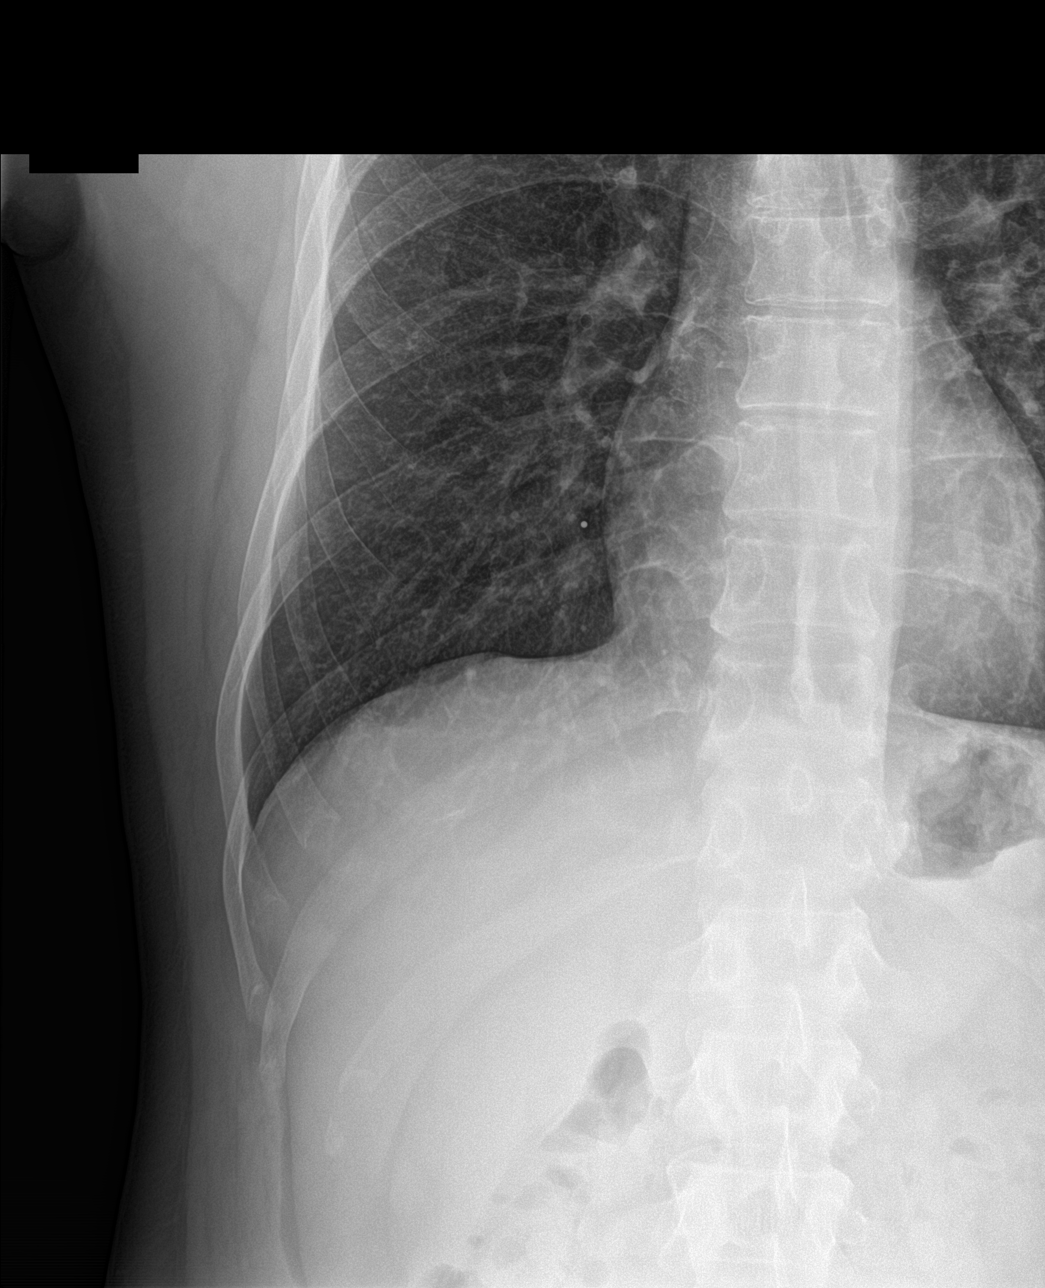

[rib obl (1 of 2)]
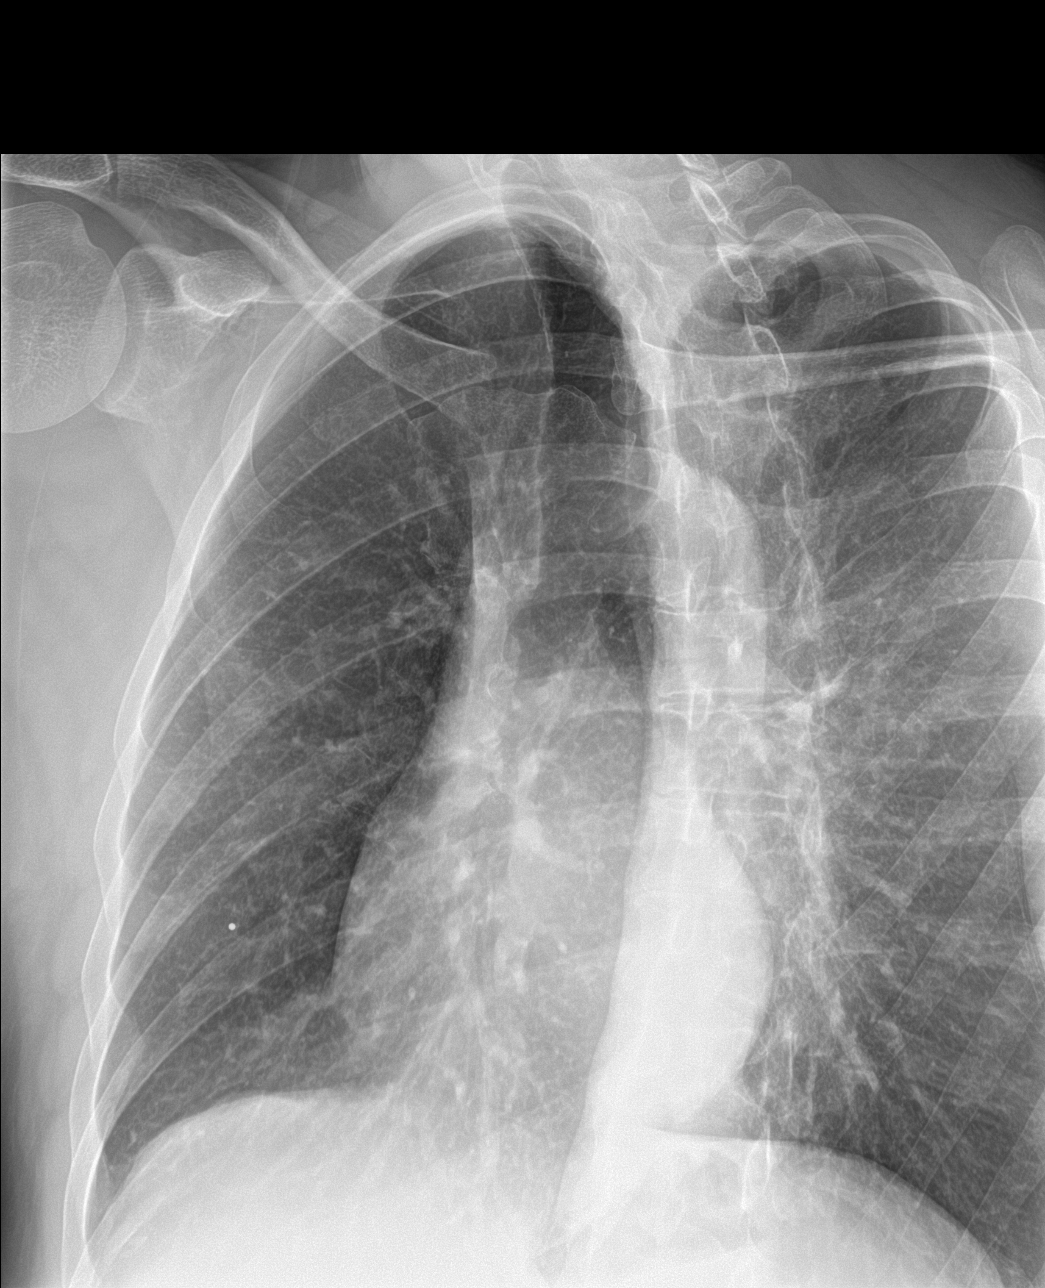

[rib obl (2 of 2)]
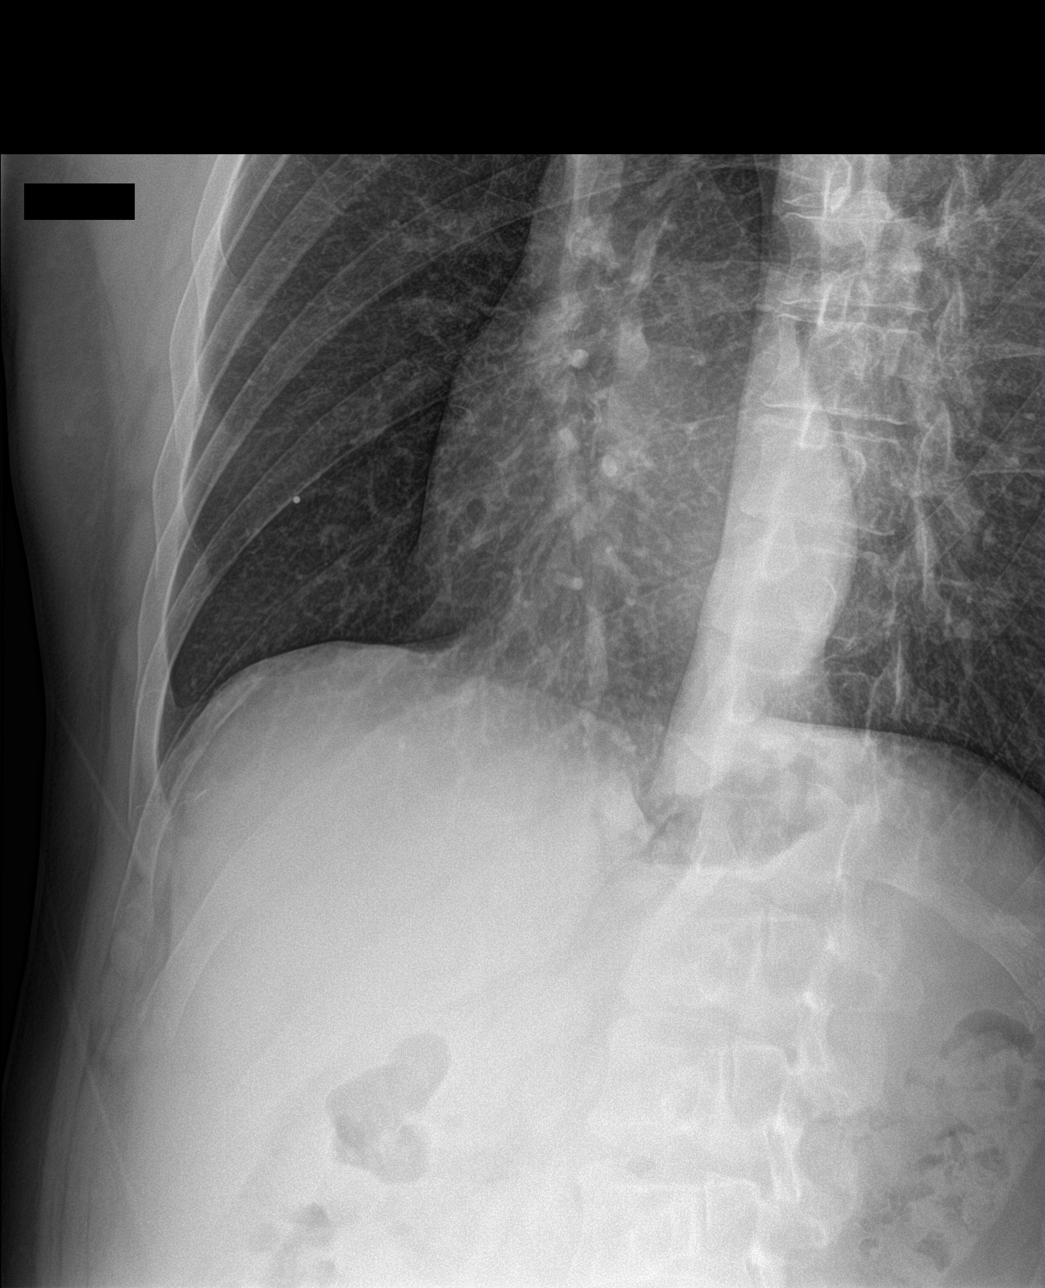

[4 of 4 positions shown; findings below may reference images not displayed]

FINDINGS: No displaced fracture or other bone lesions are seen involving the
ribs. No acute abnormality of the lungs.
IMPRESSION: 1.  No displaced rib fracture or other findings to explain pain.

2.  No acute abnormality of the lungs.

## 2024-04-04 IMAGING — DX DG RIBS 2V*L*
4 series · 4 of 4 positions shown · non-contrast
Comparison: None.

CLINICAL DATA: Rib tenderness, fall 1 month ago

EXAM:
LEFT RIBS - 2 VIEW; RIGHT RIBS - 2 VIEW

[rib pa (1 of 2)]
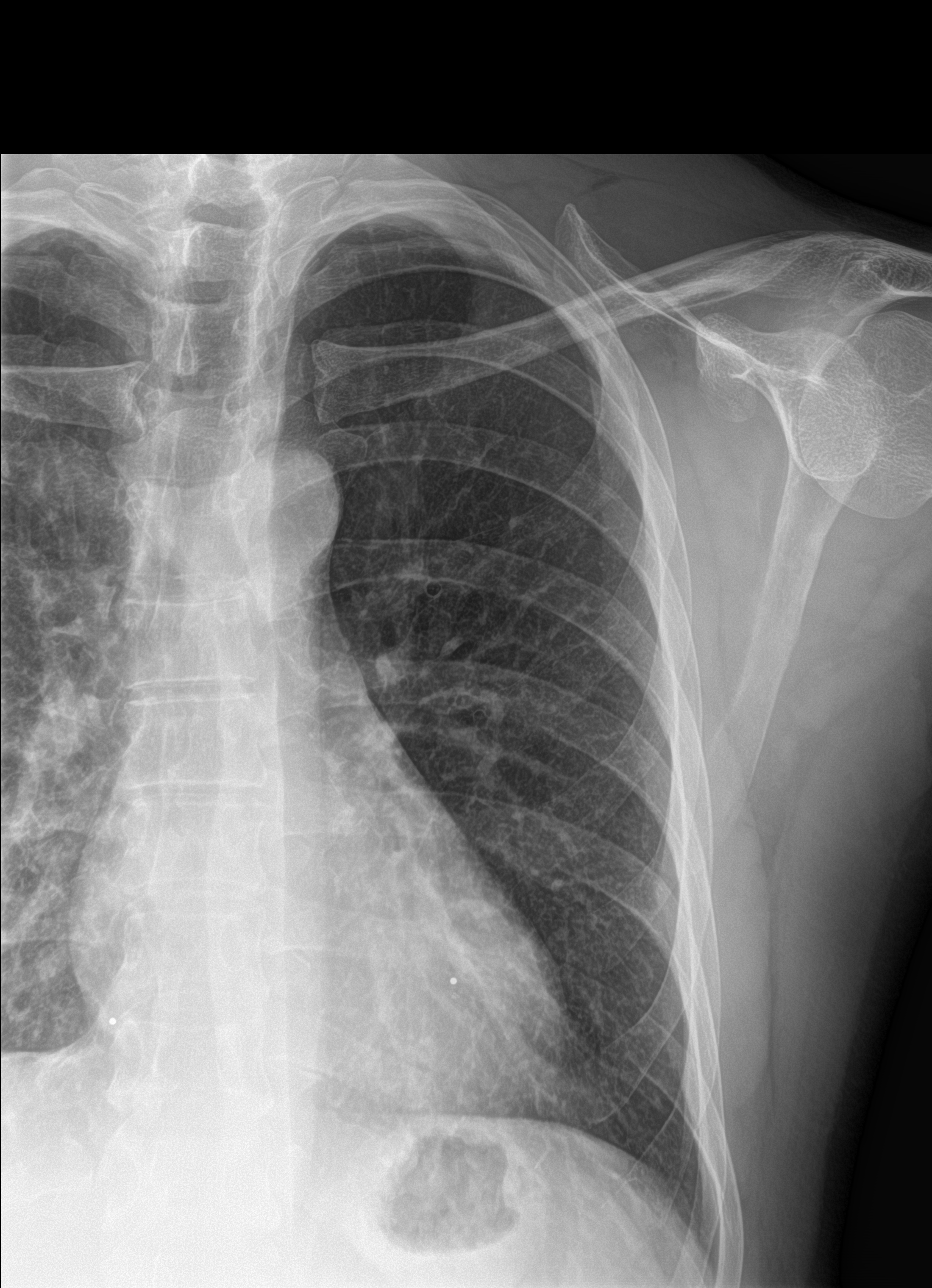

[rib pa (2 of 2)]
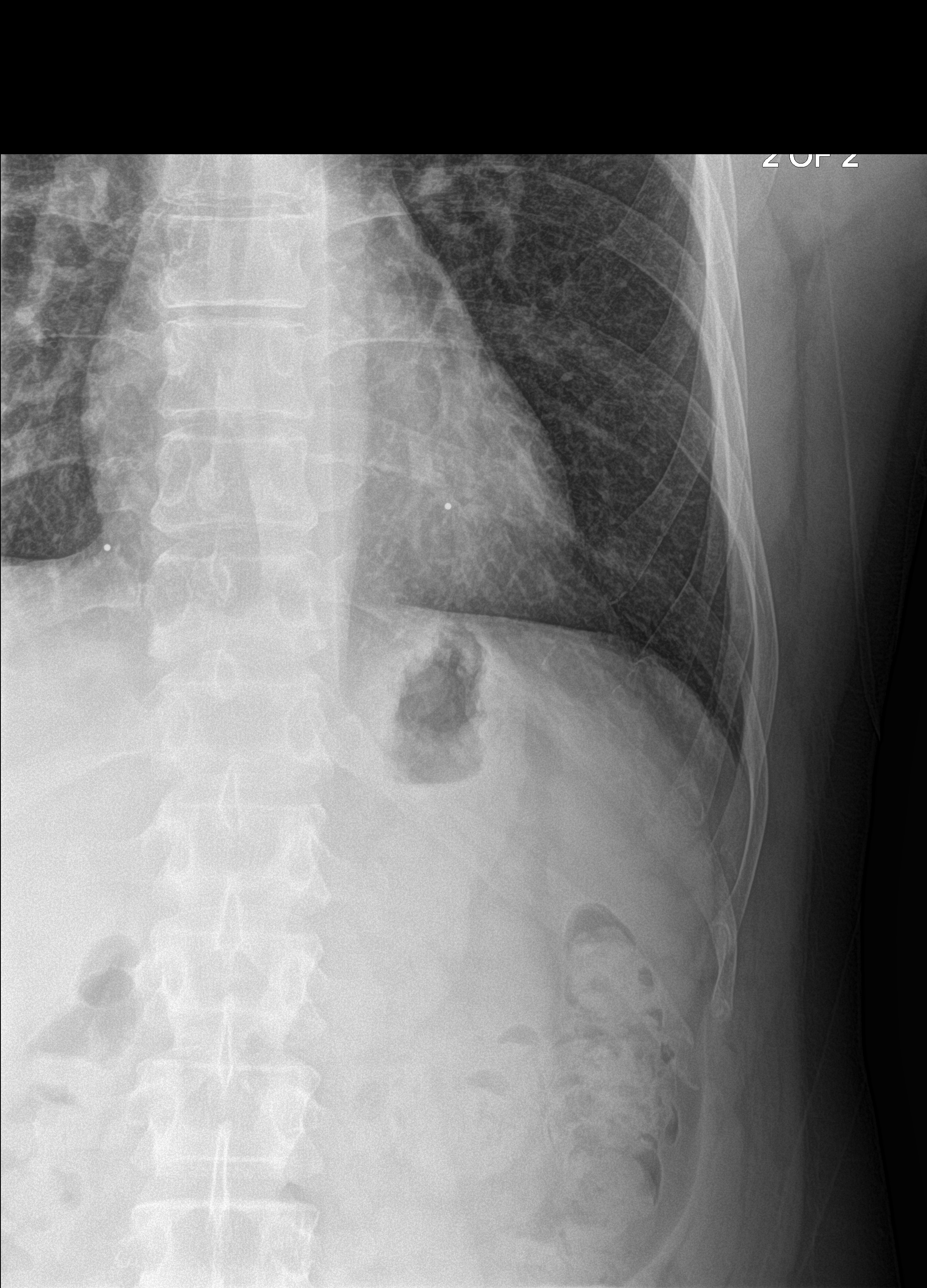

[rib obl (1 of 2)]
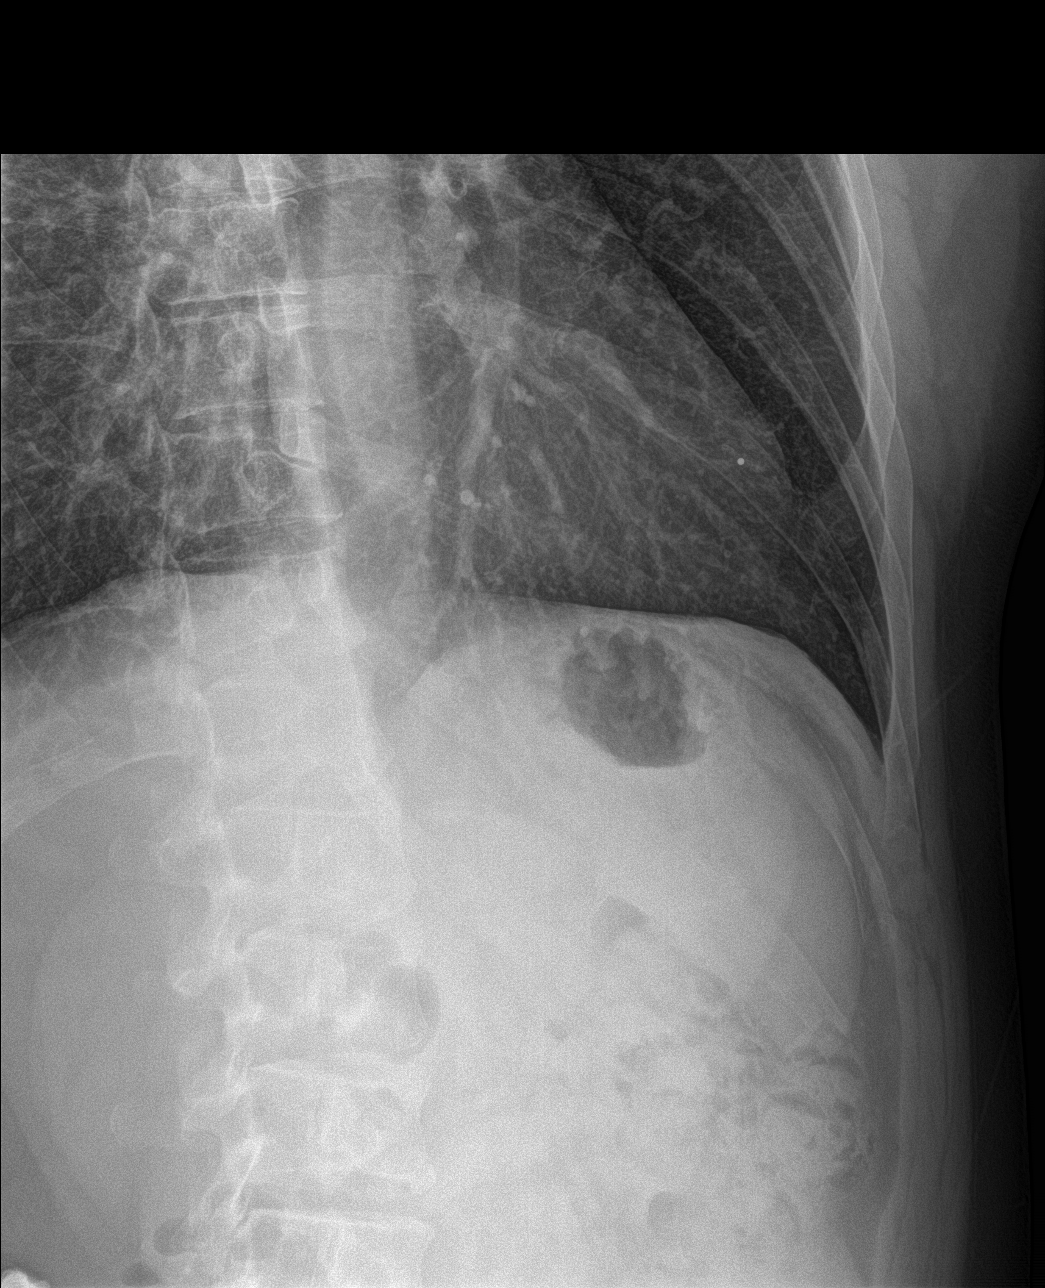

[rib obl (2 of 2)]
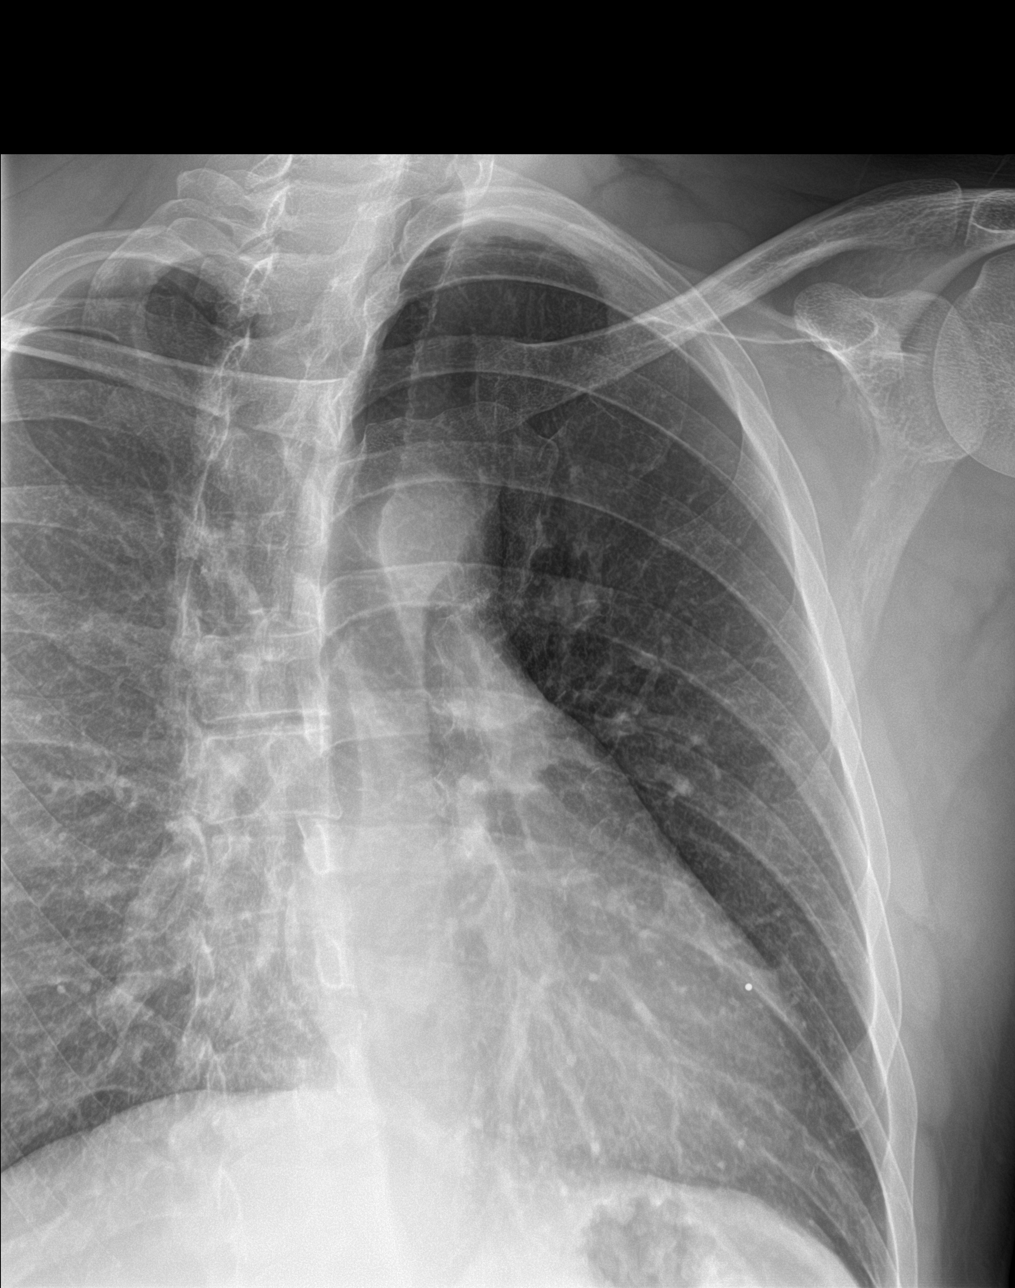

[4 of 4 positions shown; findings below may reference images not displayed]

FINDINGS: No displaced fracture or other bone lesions are seen involving the
ribs. No acute abnormality of the lungs.
IMPRESSION: 1.  No displaced rib fracture or other findings to explain pain.

2.  No acute abnormality of the lungs.

## 2024-09-20 ENCOUNTER — Ambulatory Visit: Payer: PRIVATE HEALTH INSURANCE | Admitting: Family Medicine
